# Patient Record
Sex: Male | Born: 1960 | Race: White | Hispanic: No | Marital: Married | Smoking: Never smoker
Health system: Southern US, Community
[De-identification: ages and names within clinical notes are randomized; demographics above are authoritative.]

## PROBLEM LIST (undated history)

## (undated) DIAGNOSIS — D702 Other drug-induced agranulocytosis: Secondary | ICD-10-CM

## (undated) DIAGNOSIS — S39012A Strain of muscle, fascia and tendon of lower back, initial encounter: Secondary | ICD-10-CM

## (undated) DIAGNOSIS — M109 Gout, unspecified: Secondary | ICD-10-CM

## (undated) DIAGNOSIS — T7840XA Allergy, unspecified, initial encounter: Secondary | ICD-10-CM

## (undated) DIAGNOSIS — D696 Thrombocytopenia, unspecified: Secondary | ICD-10-CM

## (undated) DIAGNOSIS — R011 Cardiac murmur, unspecified: Secondary | ICD-10-CM

## (undated) DIAGNOSIS — N390 Urinary tract infection, site not specified: Secondary | ICD-10-CM

## (undated) HISTORY — DX: Thrombocytopenia, unspecified: D69.6

## (undated) HISTORY — DX: Allergy, unspecified, initial encounter: T78.40XA

## (undated) HISTORY — DX: Strain of muscle, fascia and tendon of lower back, initial encounter: S39.012A

## (undated) HISTORY — DX: Gout, unspecified: M10.9

## (undated) HISTORY — DX: Other drug-induced agranulocytosis: D70.2

## (undated) HISTORY — PX: HERNIA REPAIR: SHX51

## (undated) HISTORY — DX: Urinary tract infection, site not specified: N39.0

## (undated) HISTORY — DX: Cardiac murmur, unspecified: R01.1

## (undated) HISTORY — PX: OTHER SURGICAL HISTORY: SHX169

---
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
## 2007-09-06 ENCOUNTER — Ambulatory Visit (HOSPITAL_COMMUNITY): Admission: RE | Admit: 2007-09-06 | Discharge: 2007-09-06 | Payer: Self-pay | Admitting: Surgery

## 2011-03-28 NOTE — Op Note (Signed)
NAMEELIAN, GLOSTER              ACCOUNT NO.:  [id number]   MEDICAL RECORD NO.:  [id number]          PATIENT TYPE:  AMB   LOCATION:  DAY                          FACILITY:  [facility]   PHYSICIAN:  Isabel Caprice. Hassell Done, MD  DATE OF BIRTH:  1961/03/12   DATE OF PROCEDURE:  09/06/2007  DATE OF DISCHARGE:                               OPERATIVE REPORT   INDICATIONS:  Laderrick Wilk is a 50 year old gentleman who works this for  the city of [place] and has been having some squishy discomfort in  left inguinal region.   PREOPERATIVE DIAGNOSIS:  Bilateral inguinal hernias.   POSTOPERATIVE DIAGNOSIS:  Bilateral direct inguinal hernias.   DESCRIPTION OF PROCEDURE:  The patient was taken to room 10 on September 06, 2007 and given general anesthesia.  The abdomen was prepped with  Techni-Care and draped sterilely.  Transverse incision was made just  below the umbilicus and I cut the anterior rectus sheath and gently  retracted the rectus muscle laterally, and then I put my finger in and  dissected down toward the pubis.  I then put in the 123 balloon  dissector kit from the [facility] and insufflated that.  This created a  nice dissection done under 0 degree scope vision.  I then removed the  balloon and blew up the trocar holder and then insufflated the  preperitoneal space.  I dissected the cord structures first and had a  fatty cord on the right but no indirect hernia was seen, but a very  prominent right direct hernia was seen involving the entire floor.  The  sac was noted and the right-side did appear to be large.  Similarly we  dissected the left side and dissected the cord structures and saw no  indirect hernia, had a very large left direct hernia.  These were well  delineated and felt that I would repair these with a piece of 3-D max  mesh.  We then placed the left side first using a large piece of 3-D max  mesh, tacking it along the inguinal ligament, tacking it anteriorly and  also tied  with one tacked laterally where I could feel it.  Similarly on  the right side after we did get a nice lower lateral dissection, I  tacked it medially along the Cooper's ligament anteriorly and then  laterally.  Following this, there was no bleeding noted.  The mesh  seemed be lying very nicely and I then deflated the preperitoneal space.  The fascia was closed with 0 Vicryl and then the wounds were closed 4-0  Vicryl with Dermabond.  The patient tolerated the  procedure well and  was taken to recovery room in satisfactory condition.      Isabel Caprice Hassell Done, MD  Electronically Signed     MBM/MEDQ  D:  09/06/2007  T:  09/08/2007  Job:  [id number]   cc:   Richardson Landry A. Everlene Farrier, M.D.  Fax: [phone]

## 2011-10-18 ENCOUNTER — Ambulatory Visit (INDEPENDENT_AMBULATORY_CARE_PROVIDER_SITE_OTHER): Payer: 59

## 2011-10-18 DIAGNOSIS — D61818 Other pancytopenia: Secondary | ICD-10-CM

## 2011-10-18 DIAGNOSIS — M109 Gout, unspecified: Secondary | ICD-10-CM

## 2012-05-29 ENCOUNTER — Ambulatory Visit (INDEPENDENT_AMBULATORY_CARE_PROVIDER_SITE_OTHER): Payer: 59 | Admitting: Emergency Medicine

## 2012-05-29 VITALS — BP 118/74 | HR 57 | Temp 98.0°F | Resp 18 | Ht 74.0 in | Wt 199.0 lb

## 2012-05-29 DIAGNOSIS — M7660 Achilles tendinitis, unspecified leg: Secondary | ICD-10-CM

## 2012-05-29 MED ORDER — MELOXICAM 15 MG PO TABS: 15.0000 mg | ORAL_TABLET | Freq: Every day | ORAL | Status: DC

## 2012-05-29 NOTE — Progress Notes (Signed)
  Subjective:    Patient ID: Brendan Short, male    DOB: [birth year], 51 y.o.   MRN: [id number]  HPI 51 year old pleasant male presents with right achilles tendon pain since yesterday. He has been participating in a boot camp and 2 days ago had an intense workout.  Did not notice the pain until yesterday after work. Describes as a persistent achy, stiff pain.  Wants to make sure he has not ruptured his tendon. 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  Boot camp is finished until mid-August.  He has not taken any medications yet. History of gout but says this feels different.  No paresthesias, weakness, or numbness.      Review of Systems  All other systems reviewed and are negative.       Objective:   Physical Exam  Constitutional: He is oriented to person, place, and time. He appears well-developed and well-nourished.  HENT:  Head: Normocephalic and atraumatic.  Right Ear: External ear normal.  Left Ear: External ear normal.  Neck: Normal range of motion.  Cardiovascular: Normal rate, regular rhythm and normal heart sounds.   Pulmonary/Chest: Effort normal and breath sounds normal.  Musculoskeletal: Normal range of motion.       Right foot: He exhibits no tenderness and no bony tenderness.       Pain with plantarflexion. 5/5 strength  Negative Thompson test  Neurological: He is alert and oriented to person, place, and time.  Psychiatric: He has a normal mood and affect. His behavior is normal. Judgment and thought content normal.          Assessment & Plan:   1. Achilles tendinitis  Will treat conservatively with Mobic daily for 7-10 days.  Follow up if no improvement.  Recommend ice and rest.  Stressed the importance of stretches and warm up prior to intense exercise meloxicam (MOBIC) 15 MG tablet

## 2012-05-29 NOTE — Patient Instructions (Addendum)
Achilles Tendinitis Tendinitis a swelling and soreness of the tendon. The pain in the tendon (cord-like structure which attaches muscle to bone) is produced by tiny tears and the inflammation present in that tendon. It commonly occurs at the shoulders, heels, and elbows. It is usually caused by overusing the tendon and joint involved. Achilles tendinitis involves the Achilles tendon. This is the large tendon in the back of the leg just above the foot. It attaches the large muscles of the lower leg to the heel bone (called calcaneus).  This diagnosis (learning what is wrong) is made by examination. X-rays will be generally be normal if only tendinitis is present. HOME CARE INSTRUCTIONS   Apply ice to the injury for 15 to 20 minutes, 3 to 4 times per day. Put the ice in a plastic bag and place a towel between the bag of ice and your skin.   Try to avoid use other than gentle range of motion while the tendon is painful. Do not resume use until instructed by your caregiver. Then begin use gradually. Do not increase use to the point of pain. If pain does develop, decrease use and continue the above measures. Gradually increase activities that do not cause discomfort until you gradually achieve normal use.   Only take over-the-counter or prescription medicines for pain, discomfort, or fever as directed by your caregiver.  SEEK MEDICAL CARE IF:   Your pain and swelling increase or pain is uncontrolled with medications.   You develop new, unexplained problems (symptoms) or an increase of the symptoms that brought you to your caregiver.   You develop an inability to move your toes or foot, develop warmth and swelling in your foot, or begin running an unexplained temperature.  MAKE SURE YOU:   Understand these instructions.   Will watch your condition.   Will get help right away if you are not doing well or get worse.  Document Released: 08/09/2005 Document Revised: 10/19/2011 Document Reviewed:  06/17/2008 [facility] Patient Information 2012 [place].

## 2012-06-11 ENCOUNTER — Ambulatory Visit (INDEPENDENT_AMBULATORY_CARE_PROVIDER_SITE_OTHER): Payer: 59 | Admitting: Emergency Medicine

## 2012-06-11 VITALS — BP 125/82 | HR 55 | Temp 97.7°F | Resp 18 | Ht 75.5 in | Wt 196.0 lb

## 2012-06-11 DIAGNOSIS — Z0000 Encounter for general adult medical examination without abnormal findings: Secondary | ICD-10-CM

## 2012-06-11 DIAGNOSIS — R6882 Decreased libido: Secondary | ICD-10-CM | POA: Insufficient documentation

## 2012-06-11 DIAGNOSIS — M109 Gout, unspecified: Secondary | ICD-10-CM | POA: Insufficient documentation

## 2012-06-11 DIAGNOSIS — M779 Enthesopathy, unspecified: Secondary | ICD-10-CM

## 2012-06-11 LAB — LIPID PANEL
HDL: 44 mg/dL (ref >39)
LDL Cholesterol: 101 mg/dL — ABNORMAL HIGH (ref 0–99)
Total CHOL/HDL Ratio: 4.2 Ratio
Triglycerides: 190 mg/dL — ABNORMAL HIGH (ref <150)
VLDL: 38 mg/dL (ref 0–40)

## 2012-06-11 LAB — TESTOSTERONE: Testosterone: 382.28 ng/dL (ref 300–890)

## 2012-06-11 LAB — COMPREHENSIVE METABOLIC PANEL
ALT: 29 U/L (ref 0–53)
AST: 28 U/L (ref 0–37)
Alkaline Phosphatase: 45 U/L (ref 39–117)
BUN: 20 mg/dL (ref 6–23)
Calcium: 9.4 mg/dL (ref 8.4–10.5)
Creat: 0.99 mg/dL (ref 0.50–1.35)
Total Bilirubin: 1 mg/dL (ref 0.3–1.2)

## 2012-06-11 LAB — CBC WITH DIFFERENTIAL/PLATELET
Basophils Absolute: 0 10*3/uL (ref 0.0–0.1)
Basophils Relative: 1 % (ref 0–1)
Eosinophils Absolute: 0 10*3/uL (ref 0.0–0.7)
Eosinophils Relative: 1 % (ref 0–5)
HCT: 42.5 % (ref 39.0–52.0)
MCHC: 35.1 g/dL (ref 30.0–36.0)
MCV: 95.9 fL (ref 78.0–100.0)
Monocytes Absolute: 0.3 10*3/uL (ref 0.1–1.0)
Platelets: 124 10*3/uL — ABNORMAL LOW (ref 150–400)
RDW: 12 % (ref 11.5–15.5)
WBC: 4.1 10*3/uL (ref 4.0–10.5)

## 2012-06-11 LAB — POCT URINALYSIS DIPSTICK
Bilirubin, UA: NEGATIVE
Ketones, UA: NEGATIVE
Leukocytes, UA: NEGATIVE

## 2012-06-11 LAB — POCT UA - MICROSCOPIC ONLY
Bacteria, U Microscopic: NEGATIVE
Mucus, UA: NEGATIVE
WBC, Ur, HPF, POC: NEGATIVE
Yeast, UA: NEGATIVE

## 2012-06-11 MED ORDER — PROBENECID 500 MG PO TABS: 500.0000 mg | ORAL_TABLET | Freq: Two times a day (BID) | ORAL | Status: DC

## 2012-06-11 MED ORDER — COLCHICINE 0.6 MG PO TABS: 0.6000 mg | ORAL_TABLET | Freq: Every day | ORAL | Status: DC

## 2012-06-11 MED ORDER — DICLOFENAC SODIUM 1 % TD GEL: 1.0000 "application " | Freq: Two times a day (BID) | TRANSDERMAL | Status: DC

## 2012-06-11 NOTE — Progress Notes (Deleted)
  Subjective:    Patient ID: Brendan Short, male    DOB: 1960/11/25, 51 y.o.   MRN: [id number]  HPI    Review of Systems     Objective:   Physical Exam        Results for orders placed in visit on 06/11/12  IFOBT (OCCULT BLOOD)      Component Value Range   IFOBT Negative    POCT URINALYSIS DIPSTICK      Component Value Range   Color, UA yellow     Clarity, UA clear     Glucose, UA neg     Bilirubin, UA neg     Ketones, UA neg     Spec Grav, UA 1.010     Blood, UA neg     pH, UA 7.0     Protein, UA neg     Urobilinogen, UA 0.2     Nitrite, UA neg     Leukocytes, UA Negative    POCT UA - MICROSCOPIC ONLY      Component Value Range   WBC, Ur, HPF, POC neg     RBC, urine, microscopic 0-1     Bacteria, U Microscopic neg     Mucus, UA neg     Epithelial cells, urine per micros 0-1     Crystals, Ur, HPF, POC neg     Casts, Ur, LPF, POC neg     Yeast, UA neg     Assessment & Plan:

## 2012-06-11 NOTE — Progress Notes (Signed)
@UMFCLOGO @  Patient ID: Brendan Short MRN: [id number], DOB: 1961/03/23 51 y.o. Date of Encounter: 06/11/2012, 11:16 AM  Primary Physician: Jenny Reichmann, MD  Chief Complaint: Physical (CPE)  HPI: 51 y.o. y/o male with history noted below here for CPE.  Doing well. No issues/complaints.  Review of Systems:  Consitutional: No fever, chills, fatigue, night sweats, lymphadenopathy, or weight changes. Patient has had decreased libido. He does not had EEG but has very little desire to be sexually active. Eyes: No visual changes, eye redness, or discharge. ENT/Mouth: Ears: No otalgia, tinnitus, hearing loss, discharge. Nose: No congestion, rhinorrhea, sinus pain, or epistaxis. Throat: No sore throat, post nasal drip, or teeth pain. Cardiovascular: No CP, palpitations, diaphoresis, DOE, edema, orthopnea, PND. Respiratory: No cough, hemoptysis, SOB, or wheezing. Gastrointestinal: No anorexia, dysphagia, reflux, pain, nausea, vomiting, hematemesis, diarrhea, constipation, BRBPR, or melena. Genitourinary: No dysuria, frequency, urgency, hematuria, incontinence, nocturia, decreased urinary stream, discharge, impotence, or testicular pain/masses. Musculoskeletal: No decreased ROM, myalgias, stiffness, joint swelling, or weakness. He does have pain and discomfort in his right Achilles and is doing his stretches to help with this the Skin: No rash, erythema, lesion changes, pain, warmth, jaundice, or pruritis. Neurological: No headache, dizziness, syncope, seizures, tremors, memory loss, coordination problems, or paresthesias. Psychological: No anxiety, depression, hallucinations, SI/HI. Endocrine: No fatigue, polydipsia, polyphagia, polyuria, or known diabetes. All other systems were reviewed and are otherwise negative.  No past medical history on file.   No past surgical history on file.  Home Meds:  Prior to Admission medications   Medication Sig Start Date End Date Taking? Authorizing Provider    cholecalciferol (VITAMIN D) 1000 UNITS tablet Take 1,000 Units by mouth daily.   Yes Historical Provider, MD  colchicine 0.6 MG tablet Take 1 tablet (0.6 mg total) by mouth daily. 06/11/12  Yes [name], MD  meloxicam (MOBIC) 15 MG tablet Take 1 tablet (15 mg total) by mouth daily. 05/29/12 05/29/13 Yes Gautier, PA-C  probenecid (BENEMID) 500 MG tablet Take 1 tablet (500 mg total) by mouth 2 (two) times daily. 06/11/12  Yes [name], MD  diclofenac sodium (VOLTAREN) 1 % GEL Apply 1 application topically 2 (two) times daily. 06/11/12   [name], MD    Allergies:  Allergies  Allergen Reactions  . Allopurinol     History   Social History  . Marital Status: Married    Spouse Name: N/A    Number of Children: N/A  . Years of Education: N/A   Occupational History  . Not on file.   Social History Main Topics  . Smoking status: Never Smoker   . Smokeless tobacco: Not on file  . Alcohol Use: Not on file  . Drug Use: Not on file  . Sexually Active: Not on file   Other Topics Concern  . Not on file   Social History Narrative  . No narrative on file    No family history on file.  Physical Exam:  Blood pressure 125/82, pulse 55, temperature 97.7 F (36.5 C), resp. rate 18, height 6' 3.5" (1.918 m), weight 196 lb (88.905 kg).  General: Well developed, well nourished, in no acute distress. HEENT: Normocephalic, atraumatic. Conjunctiva pink, sclera non-icteric. Pupils 2 mm constricting to 1 mm, round, regular, and equally reactive to light and accomodation. EOMI. Internal auditory canal clear. TMs with good cone of light and without pathology. Nasal mucosa pink. Nares are without discharge. No sinus tenderness. Oral mucosa pink. Dentition .  Pharynx without exudate.   Neck: Supple. Trachea midline. No thyromegaly. Full ROM. No lymphadenopathy. Lungs: Clear to auscultation bilaterally without wheezes, rales, or rhonchi. Breathing is of normal effort and  unlabored. Cardiovascular: RRR with S1 S2. No murmurs, rubs, or gallops appreciated. Distal pulses 2+ symmetrically. No carotid or abdominal bruits.  Abdomen: Soft, non-tender, non-distended with normoactive bowel sounds. No hepatosplenomegaly or masses. No rebound/guarding. No CVA tenderness. Without hernias.  Rectal: No external hemorrhoids or fissures. Rectal vault without masses.   Genitourinary:   circumcised male. No penile lesions. Testes descended bilaterally, and smooth without tenderness or masses.  Musculoskeletal: Full range of motion and 5/5 strength throughout. Without swelling, atrophy, tenderness, crepitus, or warmth. Extremities without clubbing, cyanosis, or edema. Calves supple. Skin: Warm and moist without erythema, ecchymosis, wounds, or rash. Neuro: A+Ox3. CN II-XII grossly intact. Moves all extremities spontaneously. Full sensation throughout. Normal gait. DTR 2+ throughout upper and lower extremities. Finger to nose intact. Psych:  Responds to questions appropriately with a normal affect.   Studies: CBC, CMET, Lipid, PSA, TSH, Vitamin D. testosterone level.  .      Assessment/Plan:  51 y.o. y/o   male here for CPE Patient is stable at present. I did refill his probenecid colchicine, and voltaren gel.  I did suggest he could consider counseling he wants to hold off at this time. He will continue stretches for his Achilles tendon -  Signed, Nena Jordan, MD 06/11/2012 11:16 AM

## 2013-05-01 ENCOUNTER — Ambulatory Visit (INDEPENDENT_AMBULATORY_CARE_PROVIDER_SITE_OTHER): Payer: 59 | Admitting: Emergency Medicine

## 2013-05-01 ENCOUNTER — Ambulatory Visit: Payer: 59

## 2013-05-01 VITALS — BP 127/82 | HR 74 | Temp 98.2°F | Resp 16 | Ht 75.0 in | Wt 195.0 lb

## 2013-05-01 DIAGNOSIS — M779 Enthesopathy, unspecified: Secondary | ICD-10-CM

## 2013-05-01 DIAGNOSIS — M549 Dorsalgia, unspecified: Secondary | ICD-10-CM

## 2013-05-01 LAB — POCT URINALYSIS DIPSTICK
Bilirubin, UA: NEGATIVE
Blood, UA: NEGATIVE
Glucose, UA: NEGATIVE
Nitrite, UA: NEGATIVE
Spec Grav, UA: 1.025

## 2013-05-01 MED ORDER — CYCLOBENZAPRINE HCL 10 MG PO TABS: ORAL_TABLET | ORAL | Status: DC

## 2013-05-01 MED ORDER — DICLOFENAC SODIUM 1 % TD GEL: 1.0000 "application " | Freq: Two times a day (BID) | TRANSDERMAL | Status: DC

## 2013-05-01 MED ORDER — MELOXICAM 15 MG PO TABS: 15.0000 mg | ORAL_TABLET | Freq: Every day | ORAL | Status: AC

## 2013-05-01 NOTE — Progress Notes (Signed)
  Subjective:    Patient ID: Brendan Short, male    DOB: 24-Mar-1961, 52 y.o.   MRN: [id number]  HPI patient presents with pain in his right flank area. He has pain with twisting to the right or to the left. He has no radicular symptoms. He thinks it all started after playing golf    Review of Systems     Objective:   Physical Exam there is tenderness in the right flank area. He has pain when he twists to the right or the left. His deep tendon reflexes of lower extremities are 2+. His motor strength is 5 out of 5  Results for orders placed in visit on 05/01/13  POCT URINALYSIS DIPSTICK      Result Value Range   Color, UA yellow     Clarity, UA clear     Glucose, UA neg     Bilirubin, UA neg     Ketones, UA neg     Spec Grav, UA 1.025     Blood, UA neg     pH, UA 5.5     Protein, UA neg     Urobilinogen, UA 0.2     Nitrite, UA neg     Leukocytes, UA Negative    [facility] reading (PRIMARY) by  Dr Everlene Farrier   T spine and LS spine films are normal  Meds ordered this encounter  Medications  . meloxicam (MOBIC) 15 MG tablet    Sig: Take 1 tablet (15 mg total) by mouth daily.    Dispense:  30 tablet    Refill:  1  . cyclobenzaprine (FLEXERIL) 10 MG tablet    Sig: Take one tablet at bedtime.    Dispense:  30 tablet    Refill:  0  . diclofenac sodium (VOLTAREN) 1 % GEL    Sig: Apply 1 application topically 2 (two) times daily.    Dispense:  100 g    Refill:  11      Assessment & Plan:  We will treat him with Mobic and Flexeril. If sx's persist, will send him to physical therapy.

## 2013-06-15 ENCOUNTER — Other Ambulatory Visit: Payer: Self-pay | Admitting: Emergency Medicine

## 2013-06-17 NOTE — Telephone Encounter (Signed)
Dr Everlene Farrier, you recently saw pt but not for gout. Do you want to give pt RFs or does he need to RTC?

## 2014-02-10 ENCOUNTER — Encounter: Payer: Self-pay | Admitting: Emergency Medicine

## 2014-02-10 ENCOUNTER — Ambulatory Visit (INDEPENDENT_AMBULATORY_CARE_PROVIDER_SITE_OTHER): Payer: 59 | Admitting: Emergency Medicine

## 2014-02-10 ENCOUNTER — Ambulatory Visit: Payer: 59

## 2014-02-10 VITALS — BP 116/74 | HR 61 | Temp 98.5°F | Resp 16 | Ht 74.0 in | Wt 199.6 lb

## 2014-02-10 DIAGNOSIS — M25559 Pain in unspecified hip: Secondary | ICD-10-CM

## 2014-02-10 DIAGNOSIS — M25552 Pain in left hip: Secondary | ICD-10-CM

## 2014-02-10 DIAGNOSIS — Z0000 Encounter for general adult medical examination without abnormal findings: Secondary | ICD-10-CM

## 2014-02-10 LAB — COMPLETE METABOLIC PANEL WITH GFR
ALT: 28 U/L (ref 0–53)
AST: 26 U/L (ref 0–37)
Albumin: 4.2 g/dL (ref 3.5–5.2)
Alkaline Phosphatase: 53 U/L (ref 39–117)
BUN: 21 mg/dL (ref 6–23)
CALCIUM: 9.4 mg/dL (ref 8.4–10.5)
CHLORIDE: 102 meq/L (ref 96–112)
CO2: 31 meq/L (ref 19–32)
CREATININE: 0.97 mg/dL (ref 0.50–1.35)
GFR, Est Non African American: 89 mL/min
Glucose, Bld: 113 mg/dL — ABNORMAL HIGH (ref 70–99)
Potassium: 4.7 mEq/L (ref 3.5–5.3)
SODIUM: 140 meq/L (ref 135–145)
TOTAL PROTEIN: 6.9 g/dL (ref 6.0–8.3)
Total Bilirubin: 0.6 mg/dL (ref 0.2–1.2)

## 2014-02-10 LAB — TSH: TSH: 4.42 u[IU]/mL (ref 0.350–4.500)

## 2014-02-10 LAB — CBC
HCT: 40.9 % (ref 39.0–52.0)
Hemoglobin: 14.4 g/dL (ref 13.0–17.0)
MCH: 34.1 pg — ABNORMAL HIGH (ref 26.0–34.0)
MCHC: 35.2 g/dL (ref 30.0–36.0)
MCV: 96.9 fL (ref 78.0–100.0)
PLATELETS: 168 10*3/uL (ref 150–400)
RBC: 4.22 MIL/uL (ref 4.22–5.81)
RDW: 12.7 % (ref 11.5–15.5)
WBC: 4.3 10*3/uL (ref 4.0–10.5)

## 2014-02-10 LAB — LIPID PANEL
CHOL/HDL RATIO: 4 ratio
CHOLESTEROL: 151 mg/dL (ref 0–200)
HDL: 38 mg/dL — AB (ref >39)
LDL Cholesterol: 68 mg/dL (ref 0–99)
Triglycerides: 226 mg/dL — ABNORMAL HIGH (ref <150)
VLDL: 45 mg/dL — ABNORMAL HIGH (ref 0–40)

## 2014-02-10 LAB — POCT URINALYSIS DIPSTICK
Bilirubin, UA: NEGATIVE
Blood, UA: NEGATIVE
GLUCOSE UA: NEGATIVE
Ketones, UA: NEGATIVE
LEUKOCYTES UA: NEGATIVE
NITRITE UA: NEGATIVE
PROTEIN UA: NEGATIVE
Spec Grav, UA: 1.025
UROBILINOGEN UA: 0.2
pH, UA: 6

## 2014-02-10 LAB — PSA: PSA: 3.17 ng/mL (ref <=4.00)

## 2014-02-10 LAB — IFOBT (OCCULT BLOOD): IMMUNOLOGICAL FECAL OCCULT BLOOD TEST: NEGATIVE

## 2014-02-10 MED ORDER — MONTELUKAST SODIUM 10 MG PO TABS: 10.0000 mg | ORAL_TABLET | Freq: Every day | ORAL | Status: DC

## 2014-02-10 MED ORDER — AZELASTINE HCL 0.1 % NA SOLN: 2.0000 | Freq: Two times a day (BID) | NASAL | Status: DC

## 2014-02-10 NOTE — Addendum Note (Signed)
Addended by: Yvette Rack on: 02/10/2014 05:29 PM   Modules accepted: Orders

## 2014-02-10 NOTE — Progress Notes (Deleted)
   Subjective:    Patient ID: Brendan Short, male    DOB: 10/17/1961, 53 y.o.   MRN: [id number]  HPI    Review of Systems  Constitutional: Negative.   HENT: Positive for rhinorrhea, sinus pressure, sneezing and sore throat.   Eyes: Positive for redness.  Respiratory: Positive for cough.   Cardiovascular: Negative.   Gastrointestinal: Negative.   Endocrine: Negative.   Genitourinary: Negative.   Musculoskeletal: Negative.   Skin: Negative.   Allergic/Immunologic: Positive for environmental allergies.  Neurological: Negative.   Hematological: Negative.   Psychiatric/Behavioral: Negative.        Objective:   Physical Exam        Assessment & Plan:

## 2014-02-10 NOTE — Progress Notes (Signed)
  This chart was scribed for Brendan Russian, MD by Eston Mould, ED Scribe. This patient was seen in room Room/bed 21 and the patient's care was started at 4:32 PM. Subjective:    Patient ID: Brendan Short, male    DOB: Nov 02, 1961, 53 y.o.   MRN: [id number] Chief Complaint  Patient presents with  . Annual Exam  . Sinusitis    acting up for a month and now snoring from the sinus trouble   HPI Brendan Short is a 53 y.o. male who presents to the [facility] for annual exam and sinusitis. Pt states his Gout has been fine. He states he is taking a colcres and prevacid in the morning. He denies taking prevacid at night.  Pt states he has been having post nasal drip causing sore throat, scratchy voice, congestion, and loud snoring. Pt denies having his sleep affected by his current sx. He reports taking some Antihistamines and nasal sprays and states he had some relief but shortly returned. Pt states as a child, he would have frequent shot given for his allergies.   Pt also c/o of intermittent L hip pain. He states he is unsure if the pain is related to his golfing technique or strenuous lifting while at work.  Review of Systems  HENT: Positive for congestion, postnasal drip, sinus pressure, sore throat and voice change.   Allergic/Immunologic: Positive for environmental allergies.  Psychiatric/Behavioral: Negative for sleep disturbance.  All other systems reviewed and are negative.   Objective:   Physical Exam CONSTITUTIONAL: Well developed/well nourished HEAD: Normocephalic/atraumatic EYES: EOMI/PERRL ENMT: Mucous membranes moist NECK: supple no meningeal signs SPINE:entire spine nontender CV: S1/S2 noted, no murmurs/rubs/gallops noted LUNGS: Lungs are clear to auscultation bilaterally, no apparent distress ABDOMEN: soft, nontender, no rebound or guarding GU:no cva tenderness NEURO: Pt is awake/alert, moves all extremitiesx4 EXTREMITIES: pulses normal, full ROM SKIN: warm, color  normal PSYCH: no abnormalities of mood noted  [facility] preliminary x-ray report read by Dr. [name]: Hip x-rays are normal.  Triage Vitals:BP 116/74  Pulse 61  Temp(Src) 98.5 F (36.9 C) (Oral)  Resp 16  Ht 6' 2"  (1.88 m)  Wt 199 lb 9.6 oz (90.538 kg)  BMI 25.62 kg/m2  SpO2 96% Assessment & Plan:   Plan Singulair at night along with as a last and spray. If his snoring and nasal symptoms continue would advise referral to ENT. Hip film showed no definite abnormalities he is referred to the orthopedist for this .  I personally performed the services described in this documentation, which was scribed in my presence. The recorded information has been reviewed and is accurate.

## 2014-02-11 ENCOUNTER — Other Ambulatory Visit: Payer: Self-pay | Admitting: *Deleted

## 2014-02-11 DIAGNOSIS — R972 Elevated prostate specific antigen [PSA]: Secondary | ICD-10-CM

## 2014-02-11 LAB — URIC ACID: Uric Acid, Serum: 7.6 mg/dL (ref 4.0–7.8)

## 2014-02-16 ENCOUNTER — Telehealth: Payer: Self-pay

## 2014-02-16 NOTE — Telephone Encounter (Addendum)
PT NEED TO COME BY AND PICK UP HIS XRAY OF THE HIP PLEASE CALL [phone] AND HIS APPT IS THIS WEEK

## 2014-02-18 NOTE — Telephone Encounter (Signed)
Informed pt his x-ray disc is ready to be picked up.

## 2014-03-20 DIAGNOSIS — R972 Elevated prostate specific antigen [PSA]: Secondary | ICD-10-CM

## 2014-04-09 DIAGNOSIS — R972 Elevated prostate specific antigen [PSA]: Secondary | ICD-10-CM

## 2014-06-10 ENCOUNTER — Other Ambulatory Visit: Payer: Self-pay | Admitting: Emergency Medicine

## 2014-06-12 ENCOUNTER — Other Ambulatory Visit: Payer: Self-pay | Admitting: Emergency Medicine

## 2014-06-29 ENCOUNTER — Ambulatory Visit (INDEPENDENT_AMBULATORY_CARE_PROVIDER_SITE_OTHER): Payer: 59 | Admitting: Family Medicine

## 2014-06-29 VITALS — BP 118/68 | HR 81 | Temp 98.3°F | Resp 17 | Ht 74.5 in | Wt 200.0 lb

## 2014-06-29 DIAGNOSIS — M10021 Idiopathic gout, right elbow: Secondary | ICD-10-CM

## 2014-06-29 DIAGNOSIS — M109 Gout, unspecified: Secondary | ICD-10-CM

## 2014-06-29 MED ORDER — PREDNISONE 10 MG PO TABS: ORAL_TABLET | ORAL | Status: DC

## 2014-06-29 MED ORDER — HYDROCODONE-ACETAMINOPHEN 5-325 MG PO TABS: 1.0000 | ORAL_TABLET | Freq: Four times a day (QID) | ORAL | Status: DC | PRN

## 2014-06-29 NOTE — Progress Notes (Signed)
   Subjective:    Patient ID: Brendan Short, male    DOB: October 15, 1961, 53 y.o.   MRN: [id number]  HPI Patient reports that his right elbow has felt like it is on fire for 3 days. Played golf 3 days ago and then hit balls yesterday. He has a history of gout and is on preventive medication. Has had gout in wrists, elbow and ankle previously. Had a couple of beers yesterday and ate shrimp last night. Tried voltaren gel and naprosyn 2 tablets yesterday without relief.   Had an xray of the left elbow years ago and was noted to have spurs. Played a lot of tennis growing up.    Review of Systems No fever, no other joint involvement.    Objective:   Physical Exam  Vitals reviewed. Constitutional: He is oriented to person, place, and time. He appears well-developed and well-nourished.  HENT:  Head: Normocephalic and atraumatic.  Eyes: Conjunctivae are normal.  Neck: Normal range of motion. Neck supple.  Cardiovascular: Normal rate.   Pulmonary/Chest: Effort normal.  Musculoskeletal: Normal range of motion. He exhibits edema (Right elbow with moderate swelling, slightly red, no warmth.) and tenderness (Tender over right medial epycondyle.).  Neurological: He is alert and oriented to person, place, and time.  Skin: Skin is warm and dry.  Psychiatric: He has a normal mood and affect. His behavior is normal. Judgment and thought content normal.      Assessment & Plan:  Discussed with Dr. Lorelei Pont  1. Acute idiopathic gout of right elbow - predniSONE (DELTASONE) 10 MG tablet; Take 3 tablets daily with a meal  Dispense: 9 tablet; Refill: 0 - HYDROcodone-acetaminophen (NORCO) 5-325 MG per tablet; Take 1 tablet by mouth every 6 (six) hours as needed.  Dispense: 20 tablet; Refill: 0 -follow up if no improvement in 1 week. -provided written information regarding gout. Patient with long history of gout and knows his triggers.  Elby Beck, FNP-BC  [facility]  06/29/2014 9:05 AM

## 2014-06-29 NOTE — Patient Instructions (Signed)

## 2014-08-06 ENCOUNTER — Other Ambulatory Visit: Payer: Self-pay | Admitting: Emergency Medicine

## 2014-08-10 ENCOUNTER — Ambulatory Visit (INDEPENDENT_AMBULATORY_CARE_PROVIDER_SITE_OTHER): Payer: 59 | Admitting: Emergency Medicine

## 2014-08-10 VITALS — BP 114/68 | HR 73 | Temp 98.0°F | Resp 17 | Ht 75.0 in | Wt 203.0 lb

## 2014-08-10 DIAGNOSIS — Z8739 Personal history of other diseases of the musculoskeletal system and connective tissue: Secondary | ICD-10-CM

## 2014-08-10 DIAGNOSIS — Z862 Personal history of diseases of the blood and blood-forming organs and certain disorders involving the immune mechanism: Secondary | ICD-10-CM

## 2014-08-10 DIAGNOSIS — Z79899 Other long term (current) drug therapy: Secondary | ICD-10-CM

## 2014-08-10 DIAGNOSIS — M779 Enthesopathy, unspecified: Secondary | ICD-10-CM

## 2014-08-10 DIAGNOSIS — M545 Low back pain, unspecified: Secondary | ICD-10-CM

## 2014-08-10 DIAGNOSIS — Z8639 Personal history of other endocrine, nutritional and metabolic disease: Secondary | ICD-10-CM

## 2014-08-10 LAB — COMPREHENSIVE METABOLIC PANEL
ALK PHOS: 43 U/L (ref 39–117)
ALT: 30 U/L (ref 0–53)
AST: 26 U/L (ref 0–37)
Albumin: 4.5 g/dL (ref 3.5–5.2)
BILIRUBIN TOTAL: 0.8 mg/dL (ref 0.2–1.2)
BUN: 21 mg/dL (ref 6–23)
CO2: 28 mEq/L (ref 19–32)
Calcium: 9.6 mg/dL (ref 8.4–10.5)
Chloride: 101 mEq/L (ref 96–112)
Creat: 1.17 mg/dL (ref 0.50–1.35)
Glucose, Bld: 104 mg/dL — ABNORMAL HIGH (ref 70–99)
Potassium: 4.4 mEq/L (ref 3.5–5.3)
SODIUM: 140 meq/L (ref 135–145)
TOTAL PROTEIN: 7.2 g/dL (ref 6.0–8.3)

## 2014-08-10 LAB — URIC ACID: Uric Acid, Serum: 6.7 mg/dL (ref 4.0–7.8)

## 2014-08-10 LAB — POCT CBC
Granulocyte percent: 65.6 %G (ref 37–80)
HCT, POC: 44.6 % (ref 43.5–53.7)
HEMOGLOBIN: 15 g/dL (ref 14.1–18.1)
Lymph, poc: 1.5 (ref 0.6–3.4)
MCH, POC: 33.8 pg — AB (ref 27–31.2)
MCHC: 33.6 g/dL (ref 31.8–35.4)
MCV: 100.5 fL — AB (ref 80–97)
MID (cbc): 0.5 (ref 0–0.9)
MPV: 8.3 fL (ref 0–99.8)
PLATELET COUNT, POC: 124 10*3/uL — AB (ref 142–424)
POC Granulocyte: 3.8 (ref 2–6.9)
POC LYMPH PERCENT: 26.2 %L (ref 10–50)
POC MID %: 8.2 % (ref 0–12)
RBC: 4.44 M/uL — AB (ref 4.69–6.13)
RDW, POC: 12.6 %
WBC: 5.8 10*3/uL (ref 4.6–10.2)

## 2014-08-10 MED ORDER — DICLOFENAC SODIUM 1 % TD GEL: 1.0000 "application " | Freq: Two times a day (BID) | TRANSDERMAL | Status: DC

## 2014-08-10 MED ORDER — PROBENECID 500 MG PO TABS: 500.0000 mg | ORAL_TABLET | Freq: Every day | ORAL | Status: DC

## 2014-08-10 MED ORDER — COLCHICINE 0.6 MG PO TABS: ORAL_TABLET | ORAL | Status: DC

## 2014-08-10 NOTE — Progress Notes (Signed)
   Subjective:    Patient ID: Brendan Short, male    DOB: Nov 04, 1961, 53 y.o.   MRN: [id number] This chart was scribed for Brendan Queen, MD by Zola Button, ED Scribe. This patient was seen in room Room/bed 8 info and the patient's care was started at 10:03 AM.   HPI HPI Comments: Brendan Short is a 53 y.o. male who presents to the Emergency Department for a medication refill. Patient is doing fine, but he had a small gout flare about a month ago for which he was prescribed prednisone, which helped with the symptoms. Patient says he is due for a physical in about 6 months and a follow-up with Dr. [name] next week. He currently takes Colcrys, Voltaren and Benemid and requests prescription refill for these medications.    Review of Systems See HPI. A complete 10 system review of systems was obtained and all systems are negative except as noted in the HPI and PMH.      Objective:   Physical Exam  CONSTITUTIONAL: Well developed/well nourished HEAD: Normocephalic/atraumatic EYES: EOM/PERRL ENMT: Mucous membranes moist NECK: supple no meningeal signs SPINE: entire spine nontender CV: S1/S2 noted, no murmurs/rubs/gallops noted LUNGS: Lungs are clear to auscultation bilaterally, no apparent distress ABDOMEN: soft, nontender, no rebound or guarding GU: no cva tenderness NEURO: Pt is awake/alert, moves all extremitiesx4 EXTREMITIES: pulses normal, full ROM SKIN: warm, color normal PSYCH: no abnormalities of mood noted Results for orders placed in visit on 08/10/14  POCT CBC      Result Value Ref Range   WBC 5.8  4.6 - 10.2 K/uL   Lymph, poc 1.5  0.6 - 3.4   POC LYMPH PERCENT 26.2  10 - 50 %L   MID (cbc) 0.5  0 - 0.9   POC MID % 8.2  0 - 12 %M   POC Granulocyte 3.8  2 - 6.9   Granulocyte percent 65.6  37 - 80 %G   RBC 4.44 (*) 4.69 - 6.13 M/uL   Hemoglobin 15.0  14.1 - 18.1 g/dL   HCT, POC 44.6  43.5 - 53.7 %   MCV 100.5 (*) 80 - 97 fL   MCH, POC 33.8 (*) 27 - 31.2 pg   MCHC 33.6   31.8 - 35.4 g/dL   RDW, POC 12.6     Platelet Count, POC 124 (*) 142 - 424 K/uL   MPV 8.3  0 - 99.8 fL        Assessment & Plan:   Patient doing well regarding his gout. Meds were refilled. He takes 1 Benemid a day and 1 Colcrys a day. I also refilled his Voltaren gel. He has follow-up with Dr. [name] next week.I personally performed the services described in this documentation, which was scribed in my presence. The recorded information has been reviewed and is accurate.

## 2014-08-12 ENCOUNTER — Encounter: Payer: Self-pay | Admitting: *Deleted

## 2014-12-16 ENCOUNTER — Ambulatory Visit (INDEPENDENT_AMBULATORY_CARE_PROVIDER_SITE_OTHER): Payer: 59 | Admitting: Emergency Medicine

## 2014-12-16 ENCOUNTER — Ambulatory Visit (INDEPENDENT_AMBULATORY_CARE_PROVIDER_SITE_OTHER): Payer: 59

## 2014-12-16 VITALS — BP 114/76 | HR 70 | Temp 98.3°F | Resp 18 | Ht 75.0 in | Wt 206.8 lb

## 2014-12-16 DIAGNOSIS — R1030 Lower abdominal pain, unspecified: Secondary | ICD-10-CM

## 2014-12-16 DIAGNOSIS — R1032 Left lower quadrant pain: Secondary | ICD-10-CM

## 2014-12-16 DIAGNOSIS — M25552 Pain in left hip: Secondary | ICD-10-CM

## 2014-12-16 LAB — POCT URINALYSIS DIPSTICK
Bilirubin, UA: NEGATIVE
Blood, UA: NEGATIVE
Glucose, UA: NEGATIVE
Ketones, UA: NEGATIVE
Leukocytes, UA: NEGATIVE
Nitrite, UA: NEGATIVE
Protein, UA: NEGATIVE
Spec Grav, UA: 1.01
Urobilinogen, UA: 0.2
pH, UA: 5.5

## 2014-12-16 MED ORDER — MELOXICAM 15 MG PO TABS: 15.0000 mg | ORAL_TABLET | Freq: Every day | ORAL | Status: DC

## 2014-12-16 NOTE — Progress Notes (Addendum)
   Subjective:    Patient ID: Brendan Short, male    DOB: [birth year], 54 y.o.   MRN: [id number] This chart was scribed for [name], MD by Zola Button, Medical Scribe. This patient was seen in room 10 and the patient's care was started at 8:21 AM.   HPI HPI Comments: Brendan Short is a 54 y.o. male with a hx of gout who presents to the [facility] complaining of gradual onset, intermittent left groin pain that started about 2 months ago but worsened in the past 2 weeks. The pain is described as a "grabbing pain;" he initially thought it was a pulled muscle. Patient does not remember a specific motion or any specific time when the pain started, and he has not seen a bulge. The pain is worsened with certain movements. Patient denies testicular pain or any problems with his testicles. He sees his urologist every 6 months. Patient has PSHx of inguinal hernia surgery on both sides in 2008.    Review of Systems  Genitourinary: Negative for testicular pain.       Objective:   Physical Exam CONSTITUTIONAL: Well developed/well nourished HEAD: Normocephalic/atraumatic EYES: EOM/PERRL ENMT: Mucous membranes moist NECK: supple no meningeal signs SPINE: entire spine nontender CV: S1/S2 noted, no murmurs/rubs/gallops noted LUNGS: Lungs are clear to auscultation bilaterally, no apparent distress ABDOMEN: soft, nontender, no rebound or guarding GU: Testicles are normal. No palpable inguinal hernia. NEURO: Pt is awake/alert, moves all extremitiesx4 EXTREMITIES: There is decreased ROM of the left hip. SKIN: warm, color normal PSYCH: no abnormalities of moo [facility] reading (PRIMARY) by  Dr.Taralee Marcus moderate arthritis  R/O AVN     Assessment & Plan:  Will treat patient with Mobic. He knows the exercises to do with his left hip. If the radiologist sees signs of avascular necrosis, will proceed with MRI, otherwise, I will see the patient in March.I personally performed the services  described in this documentation, which was scribed in my presence. The recorded information has been reviewed and is accurate.

## 2015-01-26 ENCOUNTER — Ambulatory Visit (INDEPENDENT_AMBULATORY_CARE_PROVIDER_SITE_OTHER): Payer: 59 | Admitting: Emergency Medicine

## 2015-01-26 ENCOUNTER — Ambulatory Visit (INDEPENDENT_AMBULATORY_CARE_PROVIDER_SITE_OTHER): Payer: 59

## 2015-01-26 ENCOUNTER — Encounter: Payer: Self-pay | Admitting: Emergency Medicine

## 2015-01-26 VITALS — BP 124/85 | HR 60 | Temp 97.5°F | Resp 16 | Ht 75.0 in | Wt 205.0 lb

## 2015-01-26 DIAGNOSIS — Z8249 Family history of ischemic heart disease and other diseases of the circulatory system: Secondary | ICD-10-CM

## 2015-01-26 DIAGNOSIS — Z13220 Encounter for screening for lipoid disorders: Secondary | ICD-10-CM | POA: Diagnosis not present

## 2015-01-26 DIAGNOSIS — Z1329 Encounter for screening for other suspected endocrine disorder: Secondary | ICD-10-CM | POA: Diagnosis not present

## 2015-01-26 DIAGNOSIS — R739 Hyperglycemia, unspecified: Secondary | ICD-10-CM

## 2015-01-26 DIAGNOSIS — Z0000 Encounter for general adult medical examination without abnormal findings: Secondary | ICD-10-CM | POA: Diagnosis not present

## 2015-01-26 DIAGNOSIS — M10022 Idiopathic gout, left elbow: Secondary | ICD-10-CM

## 2015-01-26 DIAGNOSIS — M109 Gout, unspecified: Secondary | ICD-10-CM

## 2015-01-26 LAB — CBC WITH DIFFERENTIAL/PLATELET
BASOS ABS: 0 10*3/uL (ref 0.0–0.1)
Basophils Relative: 0 % (ref 0–1)
Eosinophils Absolute: 0 10*3/uL (ref 0.0–0.7)
Eosinophils Relative: 1 % (ref 0–5)
HCT: 42.3 % (ref 39.0–52.0)
Hemoglobin: 14.4 g/dL (ref 13.0–17.0)
Lymphocytes Relative: 35 % (ref 12–46)
Lymphs Abs: 1.3 10*3/uL (ref 0.7–4.0)
MCH: 33.2 pg (ref 26.0–34.0)
MCHC: 34 g/dL (ref 30.0–36.0)
MCV: 97.5 fL (ref 78.0–100.0)
MPV: 10.9 fL (ref 8.6–12.4)
Monocytes Absolute: 0.4 10*3/uL (ref 0.1–1.0)
Monocytes Relative: 11 % (ref 3–12)
NEUTROS ABS: 2 10*3/uL (ref 1.7–7.7)
Neutrophils Relative %: 53 % (ref 43–77)
Platelets: 142 10*3/uL — ABNORMAL LOW (ref 150–400)
RBC: 4.34 MIL/uL (ref 4.22–5.81)
RDW: 12.7 % (ref 11.5–15.5)
WBC: 3.7 10*3/uL — ABNORMAL LOW (ref 4.0–10.5)

## 2015-01-26 LAB — COMPLETE METABOLIC PANEL WITH GFR
ALK PHOS: 54 U/L (ref 39–117)
ALT: 31 U/L (ref 0–53)
AST: 28 U/L (ref 0–37)
Albumin: 4.2 g/dL (ref 3.5–5.2)
BUN: 20 mg/dL (ref 6–23)
CALCIUM: 9.1 mg/dL (ref 8.4–10.5)
CHLORIDE: 99 meq/L (ref 96–112)
CO2: 28 mEq/L (ref 19–32)
Creat: 1 mg/dL (ref 0.50–1.35)
GFR, Est African American: >89 mL/min
GFR, Est Non African American: 85 mL/min
Glucose, Bld: 99 mg/dL (ref 70–99)
POTASSIUM: 3.9 meq/L (ref 3.5–5.3)
Sodium: 135 mEq/L (ref 135–145)
TOTAL PROTEIN: 7.3 g/dL (ref 6.0–8.3)
Total Bilirubin: 0.9 mg/dL (ref 0.2–1.2)

## 2015-01-26 LAB — TSH: TSH: 2.859 u[IU]/mL (ref 0.350–4.500)

## 2015-01-26 LAB — POCT URINALYSIS DIPSTICK
Bilirubin, UA: NEGATIVE
GLUCOSE UA: NEGATIVE
Ketones, UA: NEGATIVE
Leukocytes, UA: NEGATIVE
Nitrite, UA: NEGATIVE
Protein, UA: NEGATIVE
RBC UA: NEGATIVE
UROBILINOGEN UA: 0.2
pH, UA: 5.5

## 2015-01-26 LAB — URIC ACID: Uric Acid, Serum: 7.3 mg/dL (ref 4.0–7.8)

## 2015-01-26 LAB — LIPID PANEL
Cholesterol: 160 mg/dL (ref 0–200)
HDL: 31 mg/dL — ABNORMAL LOW (ref >=40)
TRIGLYCERIDES: 459 mg/dL — AB (ref <150)
Total CHOL/HDL Ratio: 5.2 Ratio

## 2015-01-26 LAB — HEMOGLOBIN A1C
Hgb A1c MFr Bld: 5.4 % (ref <5.7)
Mean Plasma Glucose: 108 mg/dL (ref <117)

## 2015-01-26 MED ORDER — MONTELUKAST SODIUM 10 MG PO TABS: 10.0000 mg | ORAL_TABLET | Freq: Every day | ORAL | Status: DC

## 2015-01-26 MED ORDER — COLCHICINE 0.6 MG PO TABS: ORAL_TABLET | ORAL | Status: DC

## 2015-01-26 MED ORDER — PROBENECID 500 MG PO TABS: 500.0000 mg | ORAL_TABLET | Freq: Every day | ORAL | Status: DC

## 2015-01-26 NOTE — Progress Notes (Addendum)
Subjective:  This chart was scribed for Brendan Russian, MD by Ladene Artist, ED Scribe. The patient was seen in room 22. Patient's care was started at 8:39 AM.   Patient ID: Brendan Short, male    DOB: 1961/03/06, 54 y.o.   MRN: [id number]  Chief Complaint  Patient presents with  . Annual Exam   HPI HPI Comments: Brendan Short is a 54 y.o. male, with a h/o gout, who presents to the [facility] for an annual exam.   Gout Pt states that gout has improved overall. He reports occasional mild flare-ups that are improved with stretching and exercising.   Pollen Allergy  Pt reports flare-up with environmental allergies last week; triggered by pollen. As a child, pt received allergy shots for pollen. He states that allergies improved but have worsened over the past few years. Pt has been treating nasal congestion, scratchy throat, itchy and watery eyes with Singulair and nasal spray.   Preventative Maintenance  Pt has an upcoming annual rectal exam by urologist in September or October.  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 Pt's mother died at age 34 from aortic aneurysm. Pt has not been seen by a cardiologist yet.   This summer, pt plans to travel to the [place] and play golf more often.   Past Medical History  Diagnosis Date  . Allergy    Current Outpatient Prescriptions on File Prior to Visit  Medication Sig Dispense Refill  . cholecalciferol (VITAMIN D) 1000 UNITS tablet Take 1,000 Units by mouth daily.    . colchicine (COLCRYS) 0.6 MG tablet TAKE 1 TABLET BY MOUTH EVERY DAY 30 tablet 11  . diclofenac sodium (VOLTAREN) 1 % GEL Apply 1 application topically 2 (two) times daily. 100 g 11  . probenecid (BENEMID) 500 MG tablet Take 1 tablet (500 mg total) by mouth daily. 30 tablet 11   No current facility-administered medications on file prior to visit.   Allergies  Allergen Reactions   . Allopurinol    Review of Systems  HENT: Positive for congestion.   Eyes: Positive for itching.  Allergic/Immunologic: Positive for environmental allergies.      Objective:   Physical Exam CONSTITUTIONAL: Well developed/well nourished HEAD: Normocephalic/atraumatic, 81m papilloma on mid L upper lid with benign appearance  EYES: EOMI/PERRL ENMT: Mucous membranes moist NECK: supple no meningeal signs SPINE/BACK:entire spine nontender CV: S1/S2 noted, no murmurs/rubs/gallops noted LUNGS: Lungs are clear to auscultation bilaterally, no apparent distress ABDOMEN: soft, nontender, no rebound or guarding, bowel sounds noted throughout abdomen GU:no cva tenderness. Prostate exam deferred. Pt has examination by urologist yearly  NEURO: Pt is awake/alert/appropriate, moves all extremitiesx4.  No facial droop.   EXTREMITIES: pulses normal/equal, full ROM, degenerative changes in fingers on both hands SKIN: warm, color normal PSYCH: no abnormalities of mood noted, alert and oriented to situation   EKG normal sinus rhythm sinus bradycardia [facility] reading (PRIMARY) by  Dr. DEverlene Farrier NAD Results for orders placed or performed in visit on 01/26/15  POCT urinalysis dipstick  Result Value Ref Range   Color, UA yellow    Clarity, UA clear    Glucose, UA neg    Bilirubin, UA neg    Ketones, UA neg    Spec Grav, UA <=1.005    Blood, UA neg    pH, UA 5.5    Protein, UA neg  Urobilinogen, UA 0.2    Nitrite, UA neg    Leukocytes, UA Negative    Assessment & Plan:  Patient stable. Singulair refilled.Recheck 1 year.I personally performed the services described in this documentation, which was scribed in my presence. The recorded information has been reviewed and is accurate.

## 2015-08-17 ENCOUNTER — Encounter: Payer: Self-pay | Admitting: Emergency Medicine

## 2015-09-29 ENCOUNTER — Ambulatory Visit (INDEPENDENT_AMBULATORY_CARE_PROVIDER_SITE_OTHER): Payer: Commercial Managed Care - HMO

## 2015-09-29 ENCOUNTER — Ambulatory Visit (INDEPENDENT_AMBULATORY_CARE_PROVIDER_SITE_OTHER): Payer: Commercial Managed Care - HMO | Admitting: Emergency Medicine

## 2015-09-29 VITALS — BP 124/78 | HR 67 | Temp 98.6°F | Resp 17 | Ht 75.5 in | Wt 210.0 lb

## 2015-09-29 DIAGNOSIS — M545 Low back pain, unspecified: Secondary | ICD-10-CM

## 2015-09-29 NOTE — Patient Instructions (Signed)

## 2015-09-29 NOTE — Progress Notes (Signed)
This chart was scribed for [name], MD by Moises Blood, Medical Scribe. This patient was seen in Room 2 and the patient's care was started 10:15 AM.  Chief Complaint:  Chief Complaint  Patient presents with  . Back Pain    lower back pain     HPI: Brendan Short is a 54 y.o. male who reports to [facility] today complaining of lower back pain.  He has been working harder for month of October. He was driving and hit a few bumps in the road, he felt some twinge in his back 2 weeks ago. He took some aleve without relief. He went to medical services and was given a shot and meloxicam. He went to bed that night and couldn't get out of bed in the morning. He applied a cold pad and let the medication settle in. He went back to work and was checked, everything was fine last week. He felt good until this morning, when he was twisting while clearing weeds.   He had similar problems in the past, due to hernia.   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   Past Medical History  Diagnosis Date  . Allergy    Past Surgical History  Procedure Laterality Date  . Hernia repair    . Broken nose  1970   Social History   Social History  . Marital Status: Married    Spouse Name: N/A  . Number of Children: N/A  . Years of Education: N/A   Social History Main Topics  . Smoking status: Never Smoker   . Smokeless tobacco: None  . Alcohol Use: 0.0 oz/week    0 Standard drinks or equivalent per week  . Drug Use: No  . Sexual Activity: Yes   Other Topics Concern  . None   Social History Narrative   Family History  Problem Relation Age of Onset  . Heart disease Mother   . Cancer Father   . Heart disease Maternal Grandmother    Allergies  Allergen Reactions  . Allopurinol    Prior to Admission medications   Medication Sig Start Date End Date Taking? Authorizing Provider  cholecalciferol (VITAMIN D) 1000 UNITS tablet Take 1,000 Units by mouth daily.    Historical Provider,  MD  colchicine (COLCRYS) 0.6 MG tablet TAKE 1 TABLET BY MOUTH EVERY DAY 01/26/15   [name], MD  diclofenac sodium (VOLTAREN) 1 % GEL Apply 1 application topically 2 (two) times daily. 08/10/14   [name], MD  montelukast (SINGULAIR) 10 MG tablet Take 1 tablet (10 mg total) by mouth at bedtime. 01/26/15   [name], MD  probenecid (BENEMID) 500 MG tablet Take 1 tablet (500 mg total) by mouth daily. 01/26/15   [name], MD     ROS:  Constitutional: negative for chills, fever, night sweats, weight changes, or fatigue  HEENT: negative for vision changes, hearing loss, congestion, rhinorrhea, ST, epistaxis, or sinus pressure Cardiovascular: negative for chest pain or palpitations Respiratory: negative for hemoptysis, wheezing, shortness of breath, or cough Abdominal: negative for abdominal pain, nausea, vomiting, diarrhea, or constipation Dermatological: negative for rash Neurologic: negative for headache, dizziness, or syncope Musc: positive for back pain (lower)  All other systems reviewed and are otherwise negative with the exception to those above and in the HPI.  PHYSICAL EXAM: Filed Vitals:   09/29/15 1006  BP: 124/78  Pulse: 67  Temp: 98.6 F (37 C)  Resp: 17  Body mass index is 25.89 kg/(m^2).   General: Alert, no acute distress HEENT:  Normocephalic, atraumatic, oropharynx patent. Eye: [name] [facility] Cardiovascular:  Regular rate and rhythm, no rubs murmurs or gallops.  No Carotid bruits, radial pulse intact. No pedal edema.  Respiratory: Clear to auscultation bilaterally.  No wheezes, rales, or rhonchi.  No cyanosis, no use of accessory musculature Abdominal: No organomegaly, abdomen is soft and non-tender, positive bowel sounds. No masses. Musculoskeletal: Gait intact. No edema; Mild tenderness over lumbar spine, negative straight leg raising  Skin: No rashes. Neurologic: Facial musculature symmetric; reflexes 2+ motor strength equal 5/5 Psychiatric:  Patient acts appropriately throughout our interaction.  Lymphatic: No cervical or submandibular lymphadenopathy Genitourinary/Anorectal: No acute findings  LABS:    EKG/XRAY:   Primary read interpreted by [facility]. [facility]lene Farrier at [facility]: xray back: no acute findings, mild degenerative arthritis   ASSESSMENT/PLAN: He will continue meloxicam and Flexeril at present time. He will be cautious about his lifting. He is currently under [employer] to the city. He will let me know if his back a is persistent and whether I need to make referrals to orthopedics or for further evaluation by MRI.  By signing my name below, I, Moises Blood, attest that this documentation has been prepared under the direction and in the presence of [name], MD. Electronically Signed: Moises Blood, [place]. 09/29/2015 , 10:15 AM . I personally performed the services described in this documentation, which was scribed in my presence. The recorded information has been reviewed and is accurate.   Gross sideeffects, risk and benefits, and alternatives of medications d/w patient. Patient is aware that all medications have potential sideeffects and we are unable to predict every sideeffect or drug-drug interaction that may occur.  [name] MD 09/29/2015 10:15 AM

## 2016-02-03 ENCOUNTER — Ambulatory Visit (INDEPENDENT_AMBULATORY_CARE_PROVIDER_SITE_OTHER): Payer: Commercial Managed Care - HMO | Admitting: Emergency Medicine

## 2016-02-03 ENCOUNTER — Encounter: Payer: Self-pay | Admitting: Emergency Medicine

## 2016-02-03 VITALS — BP 114/76 | HR 73 | Temp 97.9°F | Resp 16 | Ht 74.5 in | Wt 207.6 lb

## 2016-02-03 DIAGNOSIS — Z8639 Personal history of other endocrine, nutritional and metabolic disease: Secondary | ICD-10-CM | POA: Diagnosis not present

## 2016-02-03 DIAGNOSIS — M545 Low back pain, unspecified: Secondary | ICD-10-CM

## 2016-02-03 DIAGNOSIS — R739 Hyperglycemia, unspecified: Secondary | ICD-10-CM | POA: Diagnosis not present

## 2016-02-03 DIAGNOSIS — Z1329 Encounter for screening for other suspected endocrine disorder: Secondary | ICD-10-CM | POA: Diagnosis not present

## 2016-02-03 DIAGNOSIS — M2550 Pain in unspecified joint: Secondary | ICD-10-CM

## 2016-02-03 DIAGNOSIS — M779 Enthesopathy, unspecified: Secondary | ICD-10-CM | POA: Diagnosis not present

## 2016-02-03 DIAGNOSIS — Z0000 Encounter for general adult medical examination without abnormal findings: Secondary | ICD-10-CM | POA: Diagnosis not present

## 2016-02-03 DIAGNOSIS — Z13220 Encounter for screening for lipoid disorders: Secondary | ICD-10-CM | POA: Diagnosis not present

## 2016-02-03 DIAGNOSIS — Z1159 Encounter for screening for other viral diseases: Secondary | ICD-10-CM

## 2016-02-03 DIAGNOSIS — Z8249 Family history of ischemic heart disease and other diseases of the circulatory system: Secondary | ICD-10-CM | POA: Diagnosis not present

## 2016-02-03 DIAGNOSIS — Z8739 Personal history of other diseases of the musculoskeletal system and connective tissue: Secondary | ICD-10-CM

## 2016-02-03 LAB — CBC WITH DIFFERENTIAL/PLATELET
BASOS PCT: 1 % (ref 0–1)
Basophils Absolute: 0 10*3/uL (ref 0.0–0.1)
EOS ABS: 0 10*3/uL (ref 0.0–0.7)
Eosinophils Relative: 1 % (ref 0–5)
HCT: 43.7 % (ref 39.0–52.0)
HEMOGLOBIN: 15.1 g/dL (ref 13.0–17.0)
LYMPHS PCT: 31 % (ref 12–46)
Lymphs Abs: 1 10*3/uL (ref 0.7–4.0)
MCH: 33.9 pg (ref 26.0–34.0)
MCHC: 34.6 g/dL (ref 30.0–36.0)
MCV: 98 fL (ref 78.0–100.0)
MONO ABS: 0.3 10*3/uL (ref 0.1–1.0)
MPV: 11.4 fL (ref 8.6–12.4)
Monocytes Relative: 9 % (ref 3–12)
Neutro Abs: 1.9 10*3/uL (ref 1.7–7.7)
Neutrophils Relative %: 58 % (ref 43–77)
PLATELETS: 134 10*3/uL — AB (ref 150–400)
RBC: 4.46 MIL/uL (ref 4.22–5.81)
RDW: 12.7 % (ref 11.5–15.5)
WBC: 3.2 10*3/uL — AB (ref 4.0–10.5)

## 2016-02-03 LAB — POCT URINALYSIS DIP (MANUAL ENTRY)
Bilirubin, UA: NEGATIVE
Blood, UA: NEGATIVE
Glucose, UA: NEGATIVE
Ketones, POC UA: NEGATIVE
LEUKOCYTES UA: NEGATIVE
NITRITE UA: NEGATIVE
PH UA: 5.5
PROTEIN UA: NEGATIVE
Spec Grav, UA: 1.01
UROBILINOGEN UA: 0.2

## 2016-02-03 LAB — POC MICROSCOPIC URINALYSIS (UMFC): MUCUS RE: ABSENT

## 2016-02-03 LAB — COMPLETE METABOLIC PANEL WITH GFR
ALBUMIN: 4.3 g/dL (ref 3.6–5.1)
ALT: 31 U/L (ref 9–46)
AST: 25 U/L (ref 10–35)
Alkaline Phosphatase: 48 U/L (ref 40–115)
BUN: 19 mg/dL (ref 7–25)
CHLORIDE: 103 mmol/L (ref 98–110)
CO2: 26 mmol/L (ref 20–31)
Calcium: 9.1 mg/dL (ref 8.6–10.3)
Creat: 0.97 mg/dL (ref 0.70–1.33)
GFR, Est African American: >89 mL/min (ref >=60)
GFR, Est Non African American: 88 mL/min (ref >=60)
GLUCOSE: 100 mg/dL — AB (ref 65–99)
POTASSIUM: 4.3 mmol/L (ref 3.5–5.3)
SODIUM: 139 mmol/L (ref 135–146)
Total Bilirubin: 0.7 mg/dL (ref 0.2–1.2)
Total Protein: 7.2 g/dL (ref 6.1–8.1)

## 2016-02-03 LAB — LIPID PANEL
CHOL/HDL RATIO: 4.2 ratio (ref <=5.0)
Cholesterol: 172 mg/dL (ref 125–200)
HDL: 41 mg/dL (ref >=40)
LDL CALC: 73 mg/dL (ref <130)
Triglycerides: 291 mg/dL — ABNORMAL HIGH (ref <150)
VLDL: 58 mg/dL — AB (ref <30)

## 2016-02-03 LAB — URIC ACID: Uric Acid, Serum: 9.1 mg/dL — ABNORMAL HIGH (ref 4.0–7.8)

## 2016-02-03 LAB — MAGNESIUM: MAGNESIUM: 1.9 mg/dL (ref 1.5–2.5)

## 2016-02-03 LAB — TSH: TSH: 3.8 m[IU]/L (ref 0.40–4.50)

## 2016-02-03 MED ORDER — PROBENECID 500 MG PO TABS: 500.0000 mg | ORAL_TABLET | Freq: Every day | ORAL | Status: DC

## 2016-02-03 MED ORDER — COLCHICINE 0.6 MG PO TABS: ORAL_TABLET | ORAL | Status: DC

## 2016-02-03 MED ORDER — DICLOFENAC SODIUM 1 % TD GEL: 1.0000 "application " | Freq: Two times a day (BID) | TRANSDERMAL | Status: DC

## 2016-02-03 NOTE — Progress Notes (Signed)
Patient ID: Brendan Short, male   DOB: Jan 19, 1961, 55 y.o.   MRN: [id number]     By signing my name below, I, Brendan Short, attest that this documentation has been prepared under the direction and in the presence of [name], MD.  Electronically Signed: Zola Short, Medical Scribe. 02/03/2016. 2:16 PM.   Chief Complaint:  Chief Complaint  Patient presents with  . Annual Exam  . Medication Refill    HPI: Brendan Short is a 55 y.o. male with a history of gout and elevated PSA who reports to [facility] today for a complete physical exam. Patient saw his dermatologist a few weeks ago and had a full body examination, which was normal. He sees Dr. [name] once a year. His last colonoscopy was about 1 year ago, done by Dr. Benson Norway, and he was told to follow-up in 5 years. He has not seen an eye doctor recently. His last Tdap was in 2011. Patient reports drinking beer occasionally, sometimes after work or on weekends.  Patient is inquiring about magnesium and if it will help with his joint pains. His gout has mostly been controlled with the medications. Alcohol and seafood had been gout triggers for him. His gout had also been triggered by black olives before.  Past Medical History  Diagnosis Date  . Allergy   . Heart murmur    Past Surgical History  Procedure Laterality Date  . Hernia repair    . Broken nose  1970  . Broken nose surgery, in the early 70"s     Social History   Social History  . Marital Status: Married    Spouse Name: N/A  . Number of Children: N/A  . Years of Education: N/A   Occupational History  . Landscaper    Social History Main Topics  . Smoking status: Never Smoker   . Smokeless tobacco: Not on file  . Alcohol Use: 0.0 oz/week    0 Standard drinks or equivalent per week     Comment: few beers  . Drug Use: No  . Sexual Activity: Yes   Other Topics Concern  . Not on file   Social History Narrative   Married   Education: College   Exercise: Yes   Family  History  Problem Relation Age of Onset  . Heart disease Mother   . Cancer Father   . Heart disease Maternal Grandmother    Allergies  Allergen Reactions  . Allopurinol    Prior to Admission medications   Medication Sig Start Date End Date Taking? Authorizing Provider  cholecalciferol (VITAMIN D) 1000 UNITS tablet Take 1,000 Units by mouth daily.    Historical Provider, MD  colchicine (COLCRYS) 0.6 MG tablet TAKE 1 TABLET BY MOUTH EVERY DAY Patient not taking: Reported on 09/29/2015 01/26/15   [name], MD  diclofenac sodium (VOLTAREN) 1 % GEL Apply 1 application topically 2 (two) times daily. 08/10/14   [name], MD  montelukast (SINGULAIR) 10 MG tablet Take 1 tablet (10 mg total) by mouth at bedtime. Patient not taking: Reported on 09/29/2015 01/26/15   [name], MD  probenecid (BENEMID) 500 MG tablet Take 1 tablet (500 mg total) by mouth daily. 01/26/15   [name], MD     ROS: The patient denies fevers, chills, night sweats, unintentional weight loss, chest pain, palpitations, wheezing, dyspnea on exertion, nausea, vomiting, abdominal pain, dysuria, hematuria, melena, numbness, weakness, or tingling.   All other systems have been reviewed and  were otherwise negative with the exception of those mentioned in the HPI and as above.    PHYSICAL EXAM: Filed Vitals:   02/03/16 1401  BP: 114/76  Pulse: 73  Temp: 97.9 F (36.6 C)  Resp: 16   Body mass index is 26.31 kg/(m^2).   General: Alert, no acute distress HEENT:  Normocephalic, atraumatic, oropharynx patent. Eye: [name] [facility] Cardiovascular:  Regular rate and rhythm, no rubs murmurs or gallops.  No Carotid bruits, radial pulse intact. No pedal edema.  Respiratory: Clear to auscultation bilaterally.  No wheezes, rales, or rhonchi.  No cyanosis, no use of accessory musculature Abdominal: No organomegaly, abdomen is soft and non-tender, positive bowel sounds.  No masses. Musculoskeletal: Gait intact. No edema,  tenderness. Degenerative changes in his joints and hands. Skin: No rashes. Neurologic: Facial musculature symmetric. Psychiatric: Patient acts appropriately throughout our interaction. Lymphatic: No cervical or submandibular lymphadenopathy   LABS:    EKG/XRAY:   Primary read interpreted by Dr. [facility]lene Farrier at [facility].   ASSESSMENT/PLAN: Patient overall doing well. He continues to struggle with joint pain. He drinks at a moderate level episodically. He tends to run a low white count and borderline low platelets. He currently is on probenecid and colchicine for treatment of his gout. He sees Dr. Marella Chimes stent for his regular prostate checks. Routine labs were done today along with a magnesium level.I personally performed the services described in this documentation, which was scribed in my presence. The recorded information has been reviewed and is accurate.    Gross sideeffects, risk and benefits, and alternatives of medications d/w patient. Patient is aware that all medications have potential sideeffects and we are unable to predict every sideeffect or drug-drug interaction that may occur.  [name] MD 02/03/2016 2:16 PM

## 2016-02-03 NOTE — Patient Instructions (Signed)
     IF you received an x-ray today, you will receive an invoice from [facility]. Please contact  Radiology at [phone] with questions or concerns regarding your invoice.   IF you received labwork today, you will receive an invoice from [facility]. Please contact [facility] at [phone] with questions or concerns regarding your invoice.   Our billing staff will not be able to assist you with questions regarding bills from these companies.  You will be contacted with the lab results as soon as they are available. The fastest way to get your results is to activate your My Chart account. Instructions are located on the last page of this paperwork. If you have not heard from us regarding the results in 2 weeks, please contact this office.      

## 2016-02-04 ENCOUNTER — Other Ambulatory Visit: Payer: Self-pay | Admitting: Emergency Medicine

## 2016-02-04 DIAGNOSIS — D696 Thrombocytopenia, unspecified: Secondary | ICD-10-CM

## 2016-02-04 DIAGNOSIS — D72819 Decreased white blood cell count, unspecified: Secondary | ICD-10-CM

## 2016-02-04 LAB — HEPATITIS C ANTIBODY: HCV Ab: NEGATIVE

## 2016-02-07 NOTE — Addendum Note (Signed)
Addended by: Wyatt Haste on: 02/07/2016 08:04 AM   Modules accepted: Miquel Dunn

## 2016-02-10 ENCOUNTER — Other Ambulatory Visit: Payer: Self-pay | Admitting: Emergency Medicine

## 2016-02-10 DIAGNOSIS — H539 Unspecified visual disturbance: Secondary | ICD-10-CM

## 2016-02-19 ENCOUNTER — Other Ambulatory Visit: Payer: Self-pay | Admitting: Emergency Medicine

## 2016-02-21 ENCOUNTER — Other Ambulatory Visit: Payer: Self-pay

## 2016-02-21 DIAGNOSIS — Z8739 Personal history of other diseases of the musculoskeletal system and connective tissue: Secondary | ICD-10-CM

## 2016-02-21 MED ORDER — COLCHICINE 0.6 MG PO TABS: ORAL_TABLET | ORAL | Status: DC

## 2016-02-21 MED ORDER — PROBENECID 500 MG PO TABS: 500.0000 mg | ORAL_TABLET | Freq: Every day | ORAL | Status: DC

## 2016-03-14 ENCOUNTER — Encounter: Payer: Self-pay | Admitting: Hematology & Oncology

## 2016-03-14 ENCOUNTER — Other Ambulatory Visit (HOSPITAL_BASED_OUTPATIENT_CLINIC_OR_DEPARTMENT_OTHER): Payer: Commercial Managed Care - HMO

## 2016-03-14 ENCOUNTER — Ambulatory Visit: Payer: Commercial Managed Care - HMO

## 2016-03-14 ENCOUNTER — Ambulatory Visit (HOSPITAL_BASED_OUTPATIENT_CLINIC_OR_DEPARTMENT_OTHER): Payer: Commercial Managed Care - HMO | Admitting: Hematology & Oncology

## 2016-03-14 VITALS — BP 113/88 | HR 75 | Temp 98.3°F | Resp 16 | Ht 74.0 in | Wt 206.0 lb

## 2016-03-14 DIAGNOSIS — Z809 Family history of malignant neoplasm, unspecified: Secondary | ICD-10-CM

## 2016-03-14 DIAGNOSIS — Z8249 Family history of ischemic heart disease and other diseases of the circulatory system: Secondary | ICD-10-CM | POA: Diagnosis not present

## 2016-03-14 DIAGNOSIS — D696 Thrombocytopenia, unspecified: Secondary | ICD-10-CM | POA: Diagnosis not present

## 2016-03-14 DIAGNOSIS — R161 Splenomegaly, not elsewhere classified: Secondary | ICD-10-CM

## 2016-03-14 DIAGNOSIS — D72819 Decreased white blood cell count, unspecified: Secondary | ICD-10-CM

## 2016-03-14 DIAGNOSIS — Z7289 Other problems related to lifestyle: Secondary | ICD-10-CM

## 2016-03-14 DIAGNOSIS — D702 Other drug-induced agranulocytosis: Secondary | ICD-10-CM | POA: Insufficient documentation

## 2016-03-14 HISTORY — DX: Thrombocytopenia, unspecified: D69.6

## 2016-03-14 HISTORY — DX: Other drug-induced agranulocytosis: D70.2

## 2016-03-14 LAB — CBC WITH DIFFERENTIAL (CANCER CENTER ONLY)
BASO#: 0 10*3/uL (ref 0.0–0.2)
BASO%: 0.5 % (ref 0.0–2.0)
EOS ABS: 0 10*3/uL (ref 0.0–0.5)
EOS%: 1 % (ref 0.0–7.0)
HCT: 40.8 % (ref 38.7–49.9)
HGB: 14.6 g/dL (ref 13.0–17.1)
LYMPH#: 1.2 10*3/uL (ref 0.9–3.3)
LYMPH%: 29.6 % (ref 14.0–48.0)
MCH: 34.4 pg — AB (ref 28.0–33.4)
MCHC: 35.8 g/dL (ref 32.0–35.9)
MCV: 96 fL (ref 82–98)
MONO#: 0.4 10*3/uL (ref 0.1–0.9)
MONO%: 9 % (ref 0.0–13.0)
NEUT#: 2.4 10*3/uL (ref 1.5–6.5)
NEUT%: 59.9 % (ref 40.0–80.0)
RBC: 4.24 10*6/uL (ref 4.20–5.70)
RDW: 11.7 % (ref 11.1–15.7)
WBC: 4 10*3/uL (ref 4.0–10.0)

## 2016-03-14 LAB — CHCC SATELLITE - SMEAR

## 2016-03-14 NOTE — Progress Notes (Signed)
Referral MD  Reason for Referral: Leukopenia and thrombocytopenia   Chief Complaint  Patient presents with  . Other    New Patient  : I was told that my blood counts are low.  HPI: Brendan Short is a very nice 55 year old white male. He works for the city [employer]. He has he works in the [place]. He has a really cool job.  He's had issues with gout in the past. He has been on colchicine and probenecid. He has not had a flareup for a while.  He's had no problems with bleeding. Has been no bruising.  He sees Dr. Everlene Farrier. He went ahead and ran some labs on him He does  have some leukopenia and thrombocytopenia.  Back in July 2013, his plate count with [phone]. His white cell count 4.1. He had a normal white cell differential.  In March 2016, his white cell count was 3.7. His hemoglobin 14.4 and platelet count 142,000. Again, the white cell differential was normal.  This year in March, his platelet count 134,000. His white cell count 3.2. At this point, it was felt that he needed be seen by hematology.  He does have severe alcohol use. He 5 drinks a couple beers a day. He does not smoke.  There is no change in bowel or bladder habits. His artery had a couple colonoscopies.  There is no family history of blood problems.  He's had no rashes. He's had no weight loss or weight gain. He's had no fatigue. He still is working pretty much full-time.  He's had no problems with swollen nodes. There's been no swallowing difficulties.  Overall, his performance status is ECOG 0.    Past Medical History  Diagnosis Date  . Allergy   . Heart murmur   . 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 Thrombocytopenia ([place]) 03/14/2016  :  Past Surgical History  Procedure Laterality Date  . Hernia repair    . Broken nose  1970  . Broken nose surgery, in the early 70"s    :   Current outpatient prescriptions:  .  cholecalciferol (VITAMIN D) 1000 UNITS tablet, Take 1,000 Units by mouth  daily., Disp: , Rfl:  .  colchicine (COLCRYS) 0.6 MG tablet, TAKE 1 TABLET BY MOUTH EVERY DAY, Disp: 30 tablet, Rfl: 11 .  diclofenac sodium (VOLTAREN) 1 % GEL, Apply 1 application topically 2 (two) times daily., Disp: 100 g, Rfl: 11 .  probenecid (BENEMID) 500 MG tablet, Take 1 tablet (500 mg total) by mouth daily., Disp: 30 tablet, Rfl: 11:  :  Allergies  Allergen Reactions  . Allopurinol   :  Family History  Problem Relation Age of Onset  . Heart disease Mother   . Cancer Father   . Heart disease Maternal Grandmother   :  Social History   Social History  . Marital Status: Married    Spouse Name: N/A  . Number of Children: N/A  . Years of Education: N/A   Occupational History  . Landscaper    Social History Main Topics  . Smoking status: Never Smoker   . Smokeless tobacco: Not on file  . Alcohol Use: 0.0 oz/week    0 Standard drinks or equivalent per week     Comment: few beers  . Drug Use: No  . Sexual Activity: Yes   Other Topics Concern  . Not on file   Social History Narrative   Married   Education: College   Exercise: Yes  :  Pertinent  items are noted in HPI.  Exam: @IPVITALS @ Well-developed and well-nourished white male in no obvious distress. Vital signs her temperature of 98.3. Pulse 75. Blood pressure 113/88. Weight is 206 pounds. Head and neck exam shows a normocephalic and atraumatic skull. He has no ocular or oral lesions. He has no palpable cervical or supraclavicular lymph nodes. Thyroid is nonpalpable. Lungs are clear bilaterally. Cardiac exam regular rate and rhythm with no murmurs, rubs or bruits. Abdomen is soft. He has good bowel sounds. There is no fluid wave. There is no palpable hepatomegaly. His spleen tip is palpable with deep inspiration. Back exam shows no tenderness over the spine, ribs or hips. Extremities shows no clubbing, cyanosis or edema. Skin exam shows no rashes, ecchymoses or petechia. Neurological exam shows no focal  neurological deficits.    Recent Labs  03/14/16 1030  WBC 4.0  HGB 14.6  HCT 40.8  PLT 111 Platelet count consistent in citrate*   No results for input(s): NA, K, CL, CO2, GLUCOSE, BUN, CREATININE, CALCIUM in the last 72 hours.  Blood smear review:  Normochromic and normocytic population of red blood cells. There are no nucleated blood cells. There are no teardrop cells. He has no schistocytes or spherocytes. White blood cells show good maturation. He has no hypersegmented polys. There is no immature myeloid or lymphoid forms. Platelets are slightly decreased in number. Platelets are well granulated. He has several large platelets.  Pathology: None     Assessment and Plan:  Brendan Short is a very nice 55 year old white male. He has mild thrombocytopenia.  By his blood smear, I would think that this would be more of a distractive or consumptive process. He does have several large platelets.  I think that he may have some splenomegaly.  I worry that there may be some liver issues. There may be some underlying mild cirrhosis that might be causing splenomegaly.  I think it be worth while doing an ultrasound of the abdomen to assess for his spleen and liver.  I do not see any the other blood smear that would suggest a lymphoproliferative process. I don't think we had to do any flow cytometry on the blood. However, if his platelet count continues to drop, we may have to consider flow cytometry but also a bone marrow biopsy.  I told him that he really has to go back on the alcohol use. I don't think this will be a problem for him.  I just don't see any malignancy that is causing the thrombocytopenia.  I told him to avoid nonsteroidals. I think if he has any problems with pain, Tylenol would be reasonable.  I will like to see him back in about 2 months area and we will see what the ultrasound of the spleen shows.  I spent 45 minutes with him and answered all of his questions.

## 2016-03-17 ENCOUNTER — Encounter (HOSPITAL_COMMUNITY): Payer: Self-pay

## 2016-03-17 ENCOUNTER — Ambulatory Visit (HOSPITAL_COMMUNITY)
Admission: RE | Admit: 2016-03-17 | Discharge: 2016-03-17 | Disposition: A | Discharge status: Home / Self Care | Payer: Commercial Managed Care - HMO | Source: Ambulatory Visit | Attending: Hematology & Oncology | Admitting: Hematology & Oncology

## 2016-03-17 DIAGNOSIS — R161 Splenomegaly, not elsewhere classified: Secondary | ICD-10-CM | POA: Diagnosis not present

## 2016-03-17 DIAGNOSIS — D72819 Decreased white blood cell count, unspecified: Secondary | ICD-10-CM | POA: Insufficient documentation

## 2016-03-17 DIAGNOSIS — K7460 Unspecified cirrhosis of liver: Secondary | ICD-10-CM | POA: Diagnosis not present

## 2016-03-17 DIAGNOSIS — D702 Other drug-induced agranulocytosis: Secondary | ICD-10-CM | POA: Insufficient documentation

## 2016-03-17 DIAGNOSIS — D696 Thrombocytopenia, unspecified: Secondary | ICD-10-CM | POA: Insufficient documentation

## 2016-03-22 ENCOUNTER — Other Ambulatory Visit: Payer: Self-pay | Admitting: Hematology & Oncology

## 2016-03-22 DIAGNOSIS — R160 Hepatomegaly, not elsewhere classified: Secondary | ICD-10-CM

## 2016-03-22 DIAGNOSIS — K7469 Other cirrhosis of liver: Secondary | ICD-10-CM

## 2016-03-23 ENCOUNTER — Other Ambulatory Visit: Payer: Self-pay | Admitting: Family

## 2016-03-23 DIAGNOSIS — D696 Thrombocytopenia, unspecified: Secondary | ICD-10-CM

## 2016-03-23 DIAGNOSIS — D702 Other drug-induced agranulocytosis: Secondary | ICD-10-CM

## 2016-03-24 ENCOUNTER — Ambulatory Visit (HOSPITAL_COMMUNITY)
Admission: RE | Admit: 2016-03-24 | Discharge: 2016-03-24 | Disposition: A | Discharge status: Home / Self Care | Payer: Commercial Managed Care - HMO | Source: Ambulatory Visit | Attending: Family | Admitting: Family

## 2016-03-24 DIAGNOSIS — D702 Other drug-induced agranulocytosis: Secondary | ICD-10-CM | POA: Diagnosis present

## 2016-03-24 DIAGNOSIS — D696 Thrombocytopenia, unspecified: Secondary | ICD-10-CM | POA: Diagnosis present

## 2016-03-24 DIAGNOSIS — N281 Cyst of kidney, acquired: Secondary | ICD-10-CM | POA: Insufficient documentation

## 2016-03-24 DIAGNOSIS — K760 Fatty (change of) liver, not elsewhere classified: Secondary | ICD-10-CM | POA: Insufficient documentation

## 2016-03-24 DIAGNOSIS — K7689 Other specified diseases of liver: Secondary | ICD-10-CM | POA: Insufficient documentation

## 2016-03-24 MED ORDER — GADOBENATE DIMEGLUMINE 529 MG/ML IV SOLN: 20.0000 mL | Freq: Once | INTRAVENOUS | Status: DC | PRN

## 2016-03-24 MED ORDER — GADOBENATE DIMEGLUMINE 529 MG/ML IV SOLN
19.0000 mL | Freq: Once | INTRAVENOUS | Status: AC | PRN
  Administered 2016-03-24: 19 mL via INTRAVENOUS

## 2016-03-27 ENCOUNTER — Telehealth: Payer: Self-pay | Admitting: Family

## 2016-03-27 NOTE — Telephone Encounter (Signed)
Spoke with Brendan Short over the phone and discussed his abdominal MRI. He was found to have hepatic steatosis. No cirrhosis or ascites. Spleen normal. He will try to start limiting his fat intake and make sure he is well hydrated. He is in agreement with the plan. We will plan to see him at his follow-up appointment in July.

## 2016-05-17 ENCOUNTER — Ambulatory Visit (HOSPITAL_BASED_OUTPATIENT_CLINIC_OR_DEPARTMENT_OTHER): Payer: Commercial Managed Care - HMO | Admitting: Hematology & Oncology

## 2016-05-17 ENCOUNTER — Other Ambulatory Visit (HOSPITAL_BASED_OUTPATIENT_CLINIC_OR_DEPARTMENT_OTHER): Payer: Commercial Managed Care - HMO

## 2016-05-17 ENCOUNTER — Encounter: Payer: Self-pay | Admitting: Hematology & Oncology

## 2016-05-17 VITALS — BP 135/83 | HR 54 | Temp 97.9°F | Resp 20 | Ht 74.0 in | Wt 198.0 lb

## 2016-05-17 DIAGNOSIS — D72819 Decreased white blood cell count, unspecified: Secondary | ICD-10-CM

## 2016-05-17 DIAGNOSIS — R161 Splenomegaly, not elsewhere classified: Secondary | ICD-10-CM

## 2016-05-17 DIAGNOSIS — D696 Thrombocytopenia, unspecified: Secondary | ICD-10-CM

## 2016-05-17 DIAGNOSIS — D702 Other drug-induced agranulocytosis: Secondary | ICD-10-CM

## 2016-05-17 LAB — CBC WITH DIFFERENTIAL (CANCER CENTER ONLY)
BASO#: 0 10*3/uL (ref 0.0–0.2)
BASO%: 0.2 % (ref 0.0–2.0)
EOS ABS: 0.1 10*3/uL (ref 0.0–0.5)
EOS%: 1.9 % (ref 0.0–7.0)
HCT: 43.8 % (ref 38.7–49.9)
HEMOGLOBIN: 15.2 g/dL (ref 13.0–17.1)
LYMPH#: 1.4 10*3/uL (ref 0.9–3.3)
LYMPH%: 34.3 % (ref 14.0–48.0)
MCH: 34.6 pg — AB (ref 28.0–33.4)
MCHC: 34.7 g/dL (ref 32.0–35.9)
MCV: 100 fL — ABNORMAL HIGH (ref 82–98)
MONO#: 0.4 10*3/uL (ref 0.1–0.9)
MONO%: 9.4 % (ref 0.0–13.0)
NEUT%: 54.2 % (ref 40.0–80.0)
NEUTROS ABS: 2.3 10*3/uL (ref 1.5–6.5)
RBC: 4.39 10*6/uL (ref 4.20–5.70)
RDW: 12.1 % (ref 11.1–15.7)
WBC: 4.2 10*3/uL (ref 4.0–10.0)

## 2016-05-17 NOTE — Progress Notes (Signed)
Hematology and Oncology Follow Up Visit  Brendan Short [id number] [birth year] 55 y.o. 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 We saw him about 6 weeks ago. He is doing fairly well. He has had no problems since we last saw him. He's had no issues with gout.  He's had no bleeding or bruising. He has had no change in bowel or bladder habits.  He is trying to watch what he drinks. He is being more conscientious about this. We did do an MRI of his abdomen. This did show cirrhosis-type changes. I spent this is probably NASH. There is no splenomegaly. He had no mass in the liver.  He's had no cough or shortness of breath.  Overall, his performance status is ECOG 1.  Medications:  Current outpatient prescriptions:  .  cholecalciferol (VITAMIN D) 1000 UNITS tablet, Take 1,000 Units by mouth daily., Disp: , Rfl:  .  colchicine (COLCRYS) 0.6 MG tablet, TAKE 1 TABLET BY MOUTH EVERY DAY, Disp: 30 tablet, Rfl: 11 .  diclofenac sodium (VOLTAREN) 1 % GEL, Apply 1 application topically 2 (two) times daily., Disp: 100 g, Rfl: 11 .  probenecid (BENEMID) 500 MG tablet, Take 1 tablet (500 mg total) by mouth daily., Disp: 30 tablet, Rfl: 11  Allergies:  Allergies  Allergen Reactions  . Allopurinol     Past Medical History, Surgical history, Social history, and Family History were reviewed and updated.  Review of Systems: As above  Physical Exam:  height is 6' 2"  (1.88 m) and weight is 198 lb (89.812 kg). His oral temperature is 97.9 F (36.6 C). His blood pressure is 135/83 and his pulse is 54. His respiration is 20.   Wt Readings from Last 3 Encounters:  05/17/16 198 lb (89.812 kg)  03/14/16 206 lb (93.441 kg)  02/03/16 207 lb 9.6 oz (94.167 kg)     Head and neck exam shows a normocephalic and atraumatic skull. He has no ocular or oral lesions. He has no  palpable cervical or supraclavicular lymph nodes. Thyroid is nonpalpable. Lungs are clear bilaterally. Cardiac exam regular rate and rhythm with no murmurs, rubs or bruits. Abdomen is soft. He has good bowel sounds. There is no fluid wave. There is no palpable hepatomegaly. His spleen tip is palpable with deep inspiration. Back exam shows no tenderness over the spine, ribs or hips. Extremities shows no clubbing, cyanosis or edema. Skin exam shows no rashes, ecchymoses or petechia. Neurological exam shows no focal neurological deficits.   Lab Results  Component Value Date   WBC 4.2 05/17/2016   HGB 15.2 05/17/2016   HCT 43.8 05/17/2016   MCV 100* 05/17/2016   PLT 124 Platelet count consistent in citrate* 05/17/2016     Chemistry      Component Value Date/Time   NA 139 02/03/2016 1446   K 4.3 02/03/2016 1446   CL 103 02/03/2016 1446   CO2 26 02/03/2016 1446   BUN 19 02/03/2016 1446   CREATININE 0.97 02/03/2016 1446      Component Value Date/Time   CALCIUM 9.1 02/03/2016 1446   ALKPHOS 48 02/03/2016 1446   AST 25 02/03/2016 1446   ALT 31 02/03/2016 1446   BILITOT 0.7 02/03/2016 1446         Impression and Plan: Brendan Short is A 55 year old white male. He has mild thrombocytopenia. His platelet count is  better today.  I suppose this could still be related to medications. However, I highly suspect this probably is from some cirrhotic changes. Hopefully, he will continue to watch what he drinks and eats.  I the we can get him back in 4 months now. I then this would be reasonable for follow-up.  I'm just glad that he is doing well. I'm glad that his platelet count is improving.  I think that his plate count is improved next time we see him, that we might be able to let him go from the clinic.   Volanda Napoleon, MD 7/5/20178:22 AM

## 2016-09-22 ENCOUNTER — Ambulatory Visit: Payer: Commercial Managed Care - HMO | Admitting: Hematology & Oncology

## 2016-09-22 ENCOUNTER — Other Ambulatory Visit: Payer: Commercial Managed Care - HMO

## 2016-09-29 ENCOUNTER — Other Ambulatory Visit (HOSPITAL_BASED_OUTPATIENT_CLINIC_OR_DEPARTMENT_OTHER): Payer: Commercial Managed Care - HMO

## 2016-09-29 ENCOUNTER — Ambulatory Visit (HOSPITAL_BASED_OUTPATIENT_CLINIC_OR_DEPARTMENT_OTHER): Payer: Commercial Managed Care - HMO | Admitting: Hematology & Oncology

## 2016-09-29 VITALS — BP 135/90 | HR 92 | Temp 97.5°F | Resp 20 | Ht 74.0 in | Wt 201.8 lb

## 2016-09-29 DIAGNOSIS — D696 Thrombocytopenia, unspecified: Secondary | ICD-10-CM

## 2016-09-29 LAB — CBC WITH DIFFERENTIAL (CANCER CENTER ONLY)
BASO#: 0 10*3/uL (ref 0.0–0.2)
BASO%: 0.4 % (ref 0.0–2.0)
EOS%: 2.1 % (ref 0.0–7.0)
Eosinophils Absolute: 0.1 10*3/uL (ref 0.0–0.5)
HCT: 40.4 % (ref 38.7–49.9)
HGB: 14.2 g/dL (ref 13.0–17.1)
LYMPH#: 1.4 10*3/uL (ref 0.9–3.3)
LYMPH%: 29.1 % (ref 14.0–48.0)
MCH: 34.6 pg — ABNORMAL HIGH (ref 28.0–33.4)
MCHC: 35.1 g/dL (ref 32.0–35.9)
MCV: 99 fL — AB (ref 82–98)
MONO#: 0.5 10*3/uL (ref 0.1–0.9)
MONO%: 9.8 % (ref 0.0–13.0)
NEUT#: 2.8 10*3/uL (ref 1.5–6.5)
NEUT%: 58.6 % (ref 40.0–80.0)
PLATELETS: 127 10*3/uL — AB (ref 145–400)
RBC: 4.1 10*6/uL — AB (ref 4.20–5.70)
RDW: 11.7 % (ref 11.1–15.7)
WBC: 4.8 10*3/uL (ref 4.0–10.0)

## 2016-09-29 LAB — CHCC SATELLITE - SMEAR

## 2016-09-29 NOTE — Progress Notes (Signed)
Hematology and Oncology Follow Up Visit  NOAL ABSHIER [id number] 25-Jul-1961 55 y.o. 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 He looks quite good. He is still working pretty much full-time. He is had no problems with bleeding or bruising.  He is on daily colchicine with probenecid. He wants to know if he can possibly take this every other day. I think that this would be reasonable.  He has had no rashes. He's had no petechia. He's had no melena or bright red blood per rectum.  He's had no weight loss or weight gain.  He admits that he is still drinking some beer. He, however, is avoiding "hard liquor".  He's had no headache. He's had no fever. He's had no cough or shortness of breath.  Overall, his performance status is ECOG 0  Medications:  Current Outpatient Prescriptions:  .  cholecalciferol (VITAMIN D) 1000 UNITS tablet, Take 1,000 Units by mouth daily., Disp: , Rfl:  .  colchicine (COLCRYS) 0.6 MG tablet, TAKE 1 TABLET BY MOUTH EVERY DAY, Disp: 30 tablet, Rfl: 11 .  diclofenac sodium (VOLTAREN) 1 % GEL, Apply 1 application topically 2 (two) times daily., Disp: 100 g, Rfl: 11 .  probenecid (BENEMID) 500 MG tablet, Take 1 tablet (500 mg total) by mouth daily., Disp: 30 tablet, Rfl: 11  Allergies:  Allergies  Allergen Reactions  . Allopurinol     Past Medical History, Surgical history, Social history, and Family History were reviewed and updated.  Review of Systems: As above  Physical Exam:  height is 6' 2"  (1.88 m) and weight is 201 lb 12.8 oz (91.5 kg). His oral temperature is 97.5 F (36.4 C). His blood pressure is 135/90 and his pulse is 92. His respiration is 20.   Wt Readings from Last 3 Encounters:  09/29/16 201 lb 12.8 oz (91.5 kg)  05/17/16 198 lb (89.8 kg)  03/14/16 206 lb (93.4 kg)     Head and neck exam shows a  normocephalic and atraumatic skull. He has no ocular or oral lesions. He has no palpable cervical or supraclavicular lymph nodes. Thyroid is nonpalpable. Lungs are clear bilaterally. Cardiac exam regular rate and rhythm with no murmurs, rubs or bruits. Abdomen is soft. He has good bowel sounds. There is no fluid wave. There is no palpable hepatomegaly. His spleen tip is palpable with deep inspiration. Back exam shows no tenderness over the spine, ribs or hips. Extremities shows no clubbing, cyanosis or edema. Skin exam shows no rashes, ecchymoses or petechia. Neurological exam shows no focal neurological deficits.   Lab Results  Component Value Date   WBC 4.8 09/29/2016   HGB 14.2 09/29/2016   HCT 40.4 09/29/2016   MCV 99 (H) 09/29/2016   PLT 127 (L) 09/29/2016     Chemistry      Component Value Date/Time   NA 139 02/03/2016 1446   K 4.3 02/03/2016 1446   CL 103 02/03/2016 1446   CO2 26 02/03/2016 1446   BUN 19 02/03/2016 1446   CREATININE 0.97 02/03/2016 1446      Component Value Date/Time   CALCIUM 9.1 02/03/2016 1446   ALKPHOS 48 02/03/2016 1446   AST 25 02/03/2016 1446   ALT 31 02/03/2016 1446   BILITOT 0.7 02/03/2016 1446         Impression and Plan: Brendan Short is A 55 year old white  male. He has mild thrombocytopenia. His platelet count is better today.  I suppose this could still be related to medications. However, I highly suspect this probably is from some cirrhotic changes. Hopefully, he will continue to watch what he drinks and eats.  I the we can get him back in 6 months now. I think that this would be reasonable for follow-up.  I'm just glad that he is doing well. I'm glad that his platelet count is improving.   Brendan Napoleon, MD 11/17/201712:51 PM

## 2016-11-21 IMAGING — CR DG HIP (WITH OR WITHOUT PELVIS) 2-3V*L*
2 series · 2 of 2 positions shown · non-contrast
Comparison: None.

CLINICAL DATA: Left groin pain for 2 months.

EXAM:
LEFT HIP (WITH PELVIS) 2-3 VIEWS

[AP]
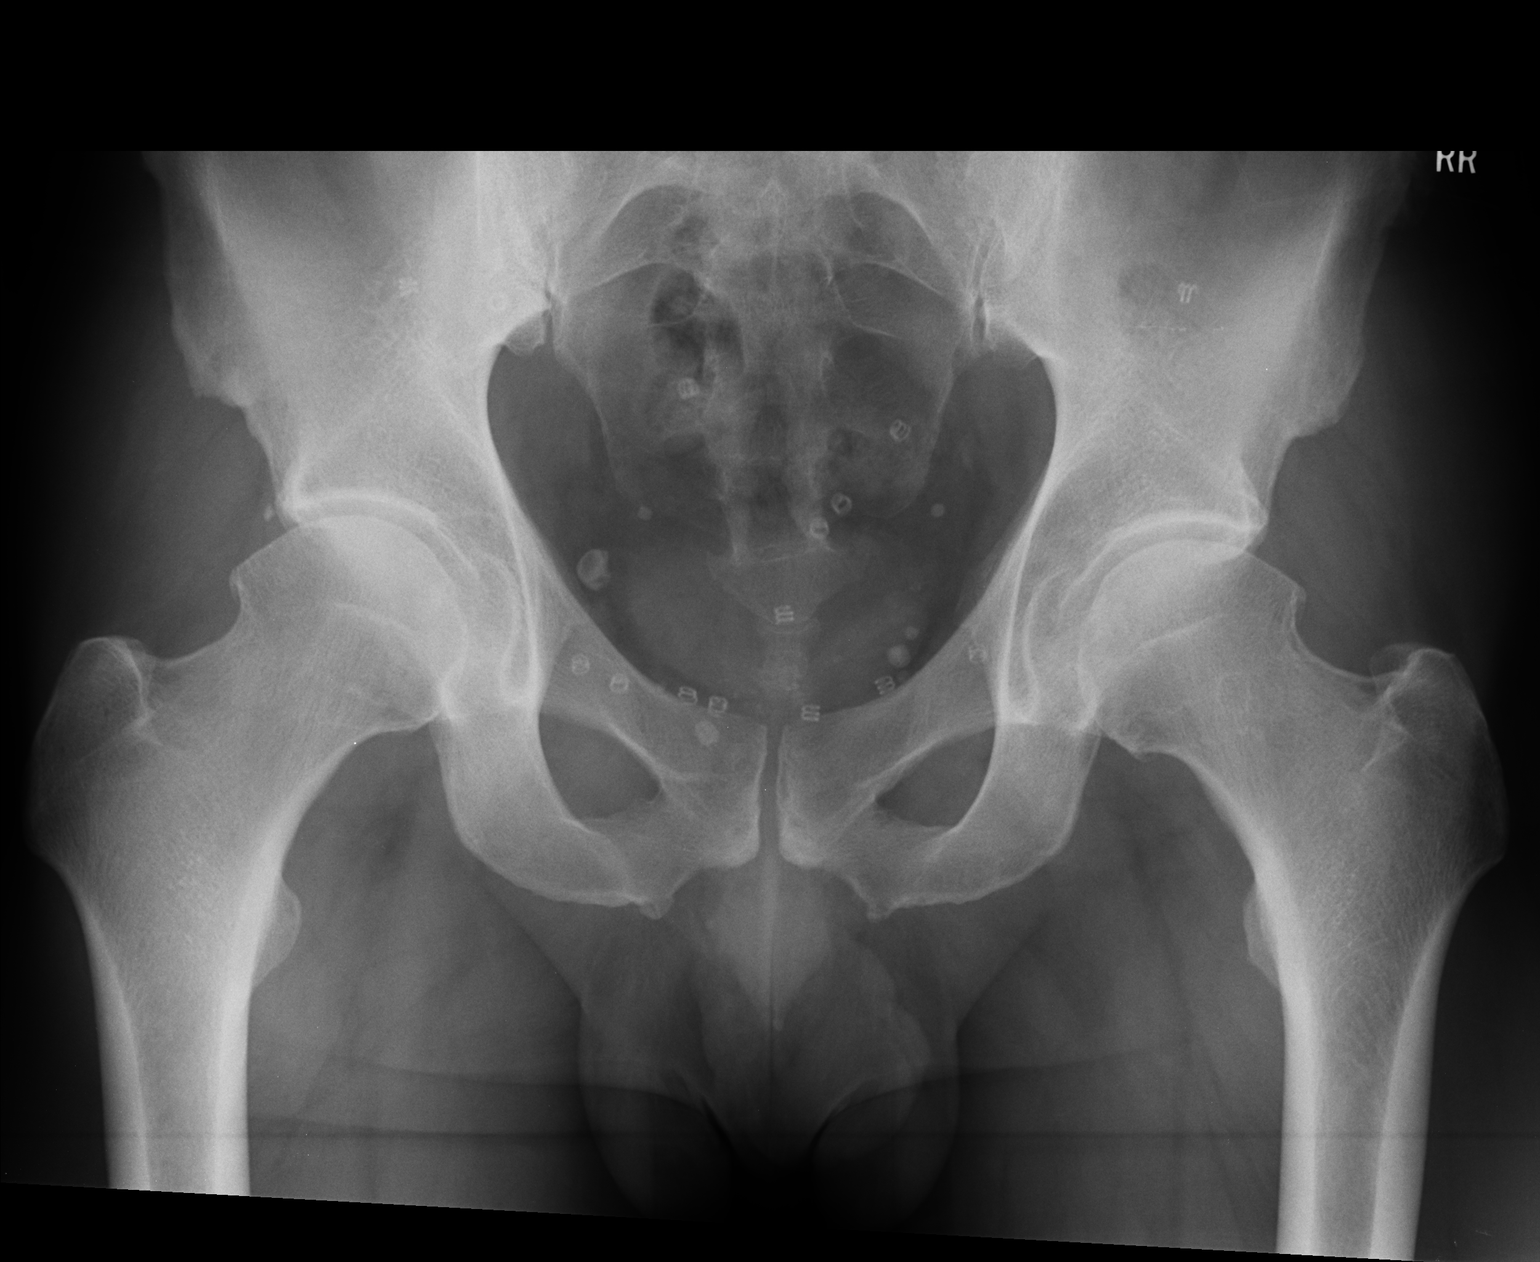

[lateral]
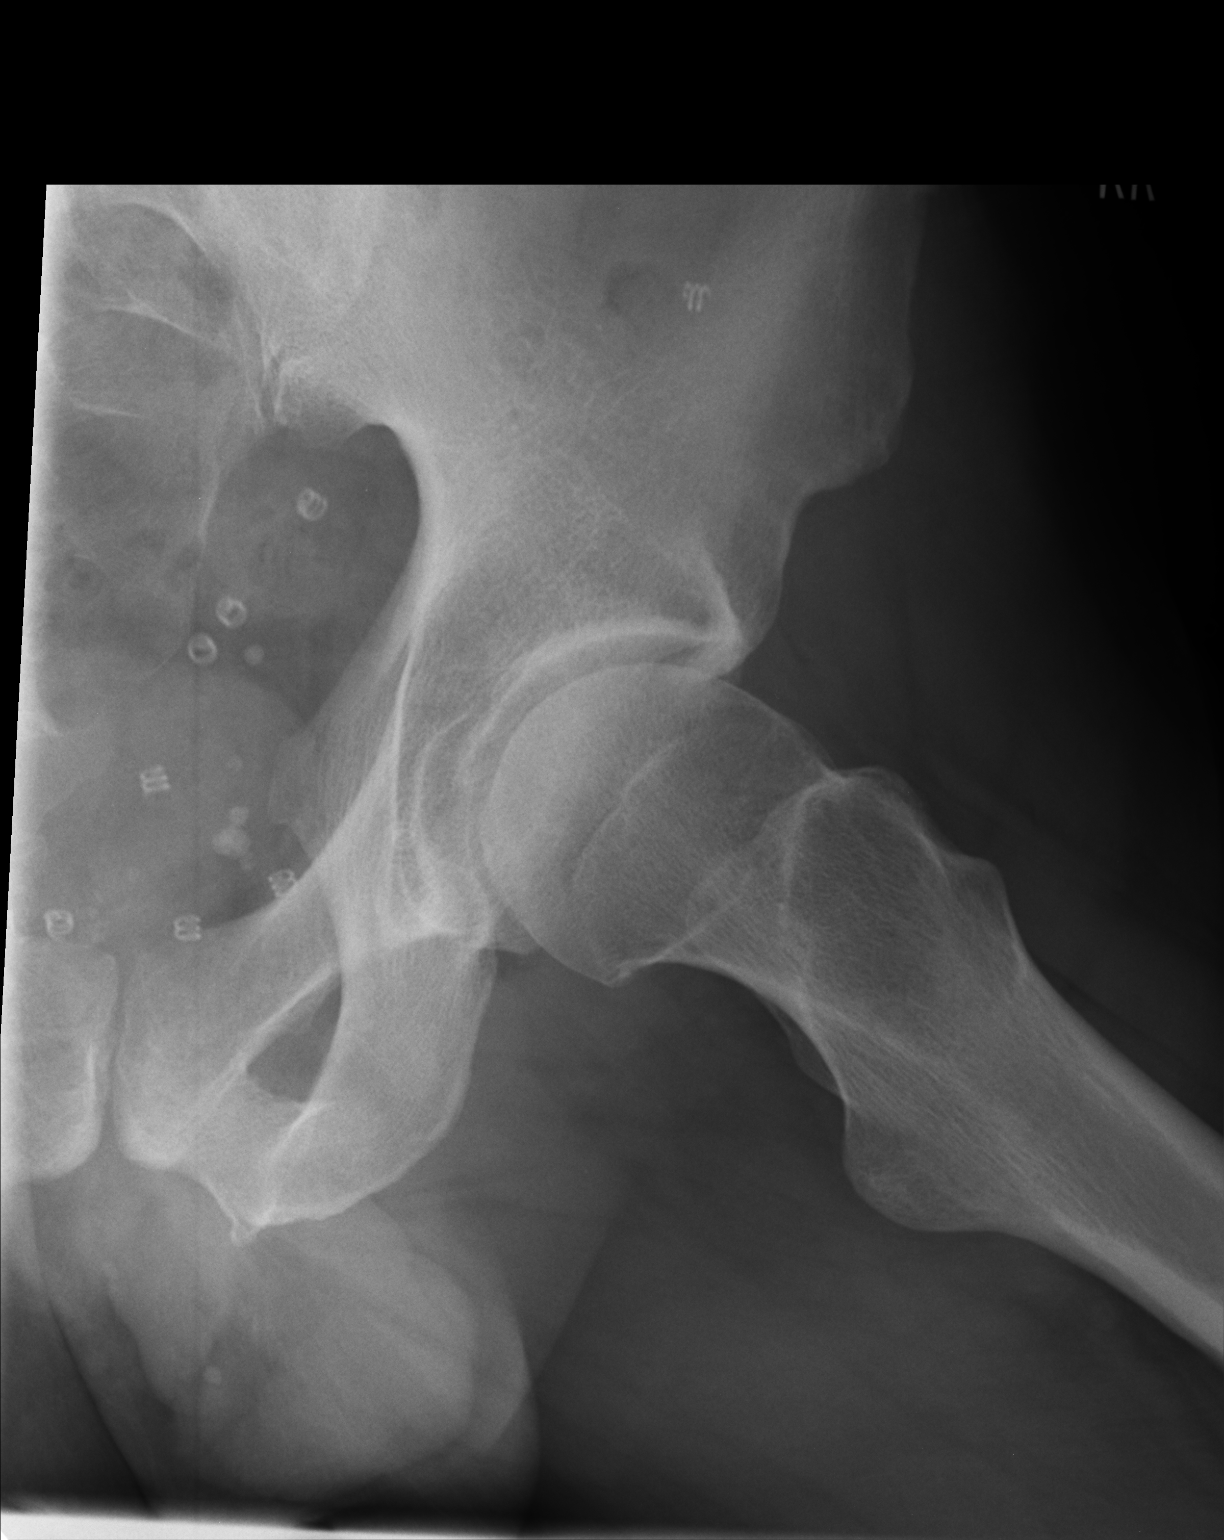

[2 of 2 positions shown; findings below may reference images not displayed]

FINDINGS: No acute bony or joint abnormality identified. Surgical coils in the
pelvis. Pelvic phleboliths. No acute bony or joint abnormality
identified.
IMPRESSION: No acute bony or joint abnormality.

## 2017-01-24 DIAGNOSIS — Q825 Congenital non-neoplastic nevus: Secondary | ICD-10-CM | POA: Diagnosis not present

## 2017-01-24 DIAGNOSIS — D225 Melanocytic nevi of trunk: Secondary | ICD-10-CM | POA: Diagnosis not present

## 2017-01-24 DIAGNOSIS — D2271 Melanocytic nevi of right lower limb, including hip: Secondary | ICD-10-CM | POA: Diagnosis not present

## 2017-02-03 ENCOUNTER — Other Ambulatory Visit: Payer: Self-pay | Admitting: Emergency Medicine

## 2017-02-03 DIAGNOSIS — Z8739 Personal history of other diseases of the musculoskeletal system and connective tissue: Secondary | ICD-10-CM

## 2017-02-03 NOTE — Telephone Encounter (Signed)
Last visit 01/2016

## 2017-02-08 ENCOUNTER — Ambulatory Visit (INDEPENDENT_AMBULATORY_CARE_PROVIDER_SITE_OTHER): Payer: Commercial Managed Care - HMO | Admitting: Family Medicine

## 2017-02-08 ENCOUNTER — Encounter: Payer: Self-pay | Admitting: Family Medicine

## 2017-02-08 VITALS — BP 126/75 | HR 61 | Temp 98.9°F | Resp 18 | Ht 74.0 in | Wt 203.0 lb

## 2017-02-08 DIAGNOSIS — J301 Allergic rhinitis due to pollen: Secondary | ICD-10-CM

## 2017-02-08 DIAGNOSIS — R040 Epistaxis: Secondary | ICD-10-CM

## 2017-02-08 DIAGNOSIS — M109 Gout, unspecified: Secondary | ICD-10-CM | POA: Diagnosis not present

## 2017-02-08 DIAGNOSIS — D696 Thrombocytopenia, unspecified: Secondary | ICD-10-CM | POA: Diagnosis not present

## 2017-02-08 DIAGNOSIS — Z0000 Encounter for general adult medical examination without abnormal findings: Secondary | ICD-10-CM

## 2017-02-08 DIAGNOSIS — Z114 Encounter for screening for human immunodeficiency virus [HIV]: Secondary | ICD-10-CM

## 2017-02-08 DIAGNOSIS — Z13220 Encounter for screening for lipoid disorders: Secondary | ICD-10-CM

## 2017-02-08 DIAGNOSIS — Z131 Encounter for screening for diabetes mellitus: Secondary | ICD-10-CM | POA: Diagnosis not present

## 2017-02-08 DIAGNOSIS — Z8739 Personal history of other diseases of the musculoskeletal system and connective tissue: Secondary | ICD-10-CM

## 2017-02-08 MED ORDER — PROBENECID 500 MG PO TABS: 500.0000 mg | ORAL_TABLET | Freq: Every day | ORAL | 3 refills | Status: DC

## 2017-02-08 MED ORDER — FLUTICASONE PROPIONATE 50 MCG/ACT NA SUSP: 1.0000 | Freq: Every day | NASAL | 6 refills | Status: DC

## 2017-02-08 MED ORDER — COLCHICINE 0.6 MG PO TABS: ORAL_TABLET | ORAL | 1 refills | Status: DC

## 2017-02-08 NOTE — Progress Notes (Signed)
By signing my name below, I, Mesha Guinyard, attest that this documentation has been prepared under the direction and in the presence of Merri Ray, MD.  Electronically Signed: Verlee Monte, Medical Scribe. 02/08/17. 3:14 PM.  Subjective:    Patient ID: Brendan Short, male    DOB: June 08, 1961, 56 y.o.   MRN: [id number]  HPI Chief Complaint  Patient presents with  . Annual Exam    HPI Comments: Brendan Short is a 56 y.o. male with a PMHx of gout, elevated PSA, and thrombocytopenia who presents to the Primary [facility] at [facility] for his annual physical.   ROS Complaints: Nosebleeds: Intermittent after waking up with a stuffy nose and after blowing his nose he'll get a "burst of blood"; doesn't occur all day and onset last fall. Pt has a gas furnace and his bed is directly under the vents. 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 Notes he broke his nose 2x when he was younger. Denies HA, bleeding gums, hematuria, and bruises/bleeding any were else. Seasonal Allergies: Pt used to get a shot when he was a teenager and has used steroid nasal sprays (flonase) in the past. Worse in the last Spring and early Summer.  Thrombocytopenia: Followed by Dr. Marin Olp. Thought to be due to NASH, medications, or possible cirrhotic changes. recommended monitoring diet and 6 months follow-up. Has biannual visits Lab Results  Component Value Date   WBC 4.8 09/29/2016   HGB 14.2 09/29/2016   HCT 40.4 09/29/2016   MCV 99 (H) 09/29/2016   PLT 127 (L) 09/29/2016   Elevated PSA: Followed by Dr. [name]. Planned for 1 year follow-up.  Gout: He takes probenecid 500 mg QD, as well as colchicine. Pt takes colchicine every other day and probenecid daily. Pt had few flares last year. Has been drinking black cherry juice, eating cherries when they're in season, drinking lots of water, and staying  away from seafood. Pt drinks on average 12 beers a week. He would drink 2-3 beers after work, but not daily, and occasionally on the weekends. He no longer drinks liquor. Lab Results  Component Value Date   LABURIC 9.1 (H) 02/03/2016   Cancer Screening: Prostate: PSA has been followed by [facility], Dr. [name]. No cancers or tumors found just an enlarged prostate. Pt has his PSA checked every 6 months. Lab Results  Component Value Date   PSA 3.17 02/10/2014   PSA 1.99 06/11/2012  Colon: Per note from physical in March 2017, colonoscopy approx Aug 20th 2015. Tubular adenoma x3 and repeat in 5 years. Skin: See annually and his last appt was 2 weeks ago. Appears to have a deviated septum to the right No active bleeding Some edema of the turbinates Immunizations: Pt receiving his flu shot from the city this season. Immunization History  Administered Date(s) Administered  . Influenza-Unspecified 10/14/2015  . Tdap 02/01/2010  . Zoster 11/13/2014   Vision: Pt is followed by Dr. Schuyler Amor with annual visit.  Visual Acuity Screening   Right eye Left eye Both eyes  Without correction: 20/20-1 20/25 20/25  With correction:      Dentist: Followed biannually by a dentist.  Exercise: Likes to walks, occasionally runs on the treadmill, plays golf on the weekends and complaints on episodic knee pain. 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 Reports struggling to breath on  humid days. Denies chest pain, SOB, or other sxs while exercising.  STI Testing: Agrees to HIV screening and denies the need for other STI screening.  Depression Screening: Depression screen [facility] 2/9 02/08/2017 02/03/2016 09/29/2015 01/26/2015 02/10/2014  Decreased Interest 0 0 0 0 0  Down, Depressed, Hopeless 0 0 0 0 0  PHQ - 2 Score 0 0 0 0 0   Patient Active Problem List   Diagnosis Date Noted  . 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 Thrombocytopenia ([place]) 03/14/2016  . Elevated PSA  03/20/2014  . Gout 06/11/2012   Past Medical History:  Diagnosis Date  . Allergy   . 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 Heart murmur   . Thrombocytopenia ([place]) 03/14/2016   Past Surgical History:  Procedure Laterality Date  . broken nose  1970  . broken nose surgery, in the early 70"s    . HERNIA REPAIR     Allergies  Allergen Reactions  . Allopurinol    Prior to Admission medications   Medication Sig Start Date End Date Taking? Authorizing Provider  cholecalciferol (VITAMIN D) 1000 UNITS tablet Take 1,000 Units by mouth daily.   Yes Historical Provider, MD  colchicine (COLCRYS) 0.6 MG tablet TAKE 1 TABLET BY MOUTH EVERY DAY 02/21/16  Yes [name], MD  diclofenac sodium (VOLTAREN) 1 % GEL Apply 1 application topically 2 (two) times daily. 02/03/16  Yes [name], MD  probenecid (BENEMID) 500 MG tablet Take 1 tablet (500 mg total) by mouth daily. 02/21/16  Yes [name], MD   Social History   Social History  . Marital status: Married    Spouse name: N/A  . Number of children: N/A  . Years of education: N/A   Occupational History  . Landscaper    Social History Main Topics  . Smoking status: Never Smoker  . Smokeless tobacco: Never Used  . Alcohol use 0.0 oz/week     Comment: few beers  . Drug use: No  . Sexual activity: Yes   Other Topics Concern  . Not on file   Social History Narrative   Married   Education: College   Exercise: Yes   Review of Systems  HENT: Positive for nosebleeds.   Allergic/Immunologic: Positive for environmental allergies.  13 point ROS negative other wise Objective:  Physical Exam  Constitutional: He is oriented to person, place, and time. He appears well-developed and well-nourished.  HENT:  Head: Normocephalic and atraumatic.  Right Ear: External ear normal.  Left Ear: External ear normal.  Mouth/Throat: Oropharynx is clear and moist.  Eyes: Conjunctivae and EOM are normal. Pupils are equal, round, and reactive  to light.  Neck: Normal range of motion. Neck supple. No thyromegaly present.  Cardiovascular: Normal rate, regular rhythm, normal heart sounds and intact distal pulses.   Pulmonary/Chest: Effort normal and breath sounds normal. No respiratory distress. He has no wheezes.  Abdominal: Soft. He exhibits no distension. There is no tenderness.  Musculoskeletal: Normal range of motion. He exhibits no edema or tenderness.  Lymphadenopathy:    He has no cervical adenopathy.  Neurological: He is alert and oriented to person, place, and time. He has normal reflexes.  Skin: Skin is warm and dry.  Psychiatric: He has a normal mood and affect. His behavior is normal.  Vitals reviewed.   Vitals:   02/08/17 1440  BP: 126/75  Pulse: 61  Resp: 18  Temp: 98.9 F (37.2 C)  TempSrc: Oral  SpO2: 96%  Weight:  203 lb (92.1 kg)  Height: 6' 2"  (1.88 m)   Body mass index is 26.06 kg/m. Assessment & Plan:    Brendan Short is a 56 y.o. male Annual physical exam  - -anticipatory guidance as below in AVS, screening labs above. Health maintenance items as above in HPI discussed/recommended as applicable.   Gout, unspecified cause, unspecified chronicity, unspecified site - Plan: Uric Acid, colchicine (COLCRYS) 0.6 MG tablet, probenecid (BENEMID) 500 MG tablet  - Appears stable. Continue probenecid daily, colchicine every other day. Check uric acid. Minimize alcohol intake and other foods that may flare gout.  Thrombocytopenia ([place]) - Plan: CBC with Differential/Platelet  - Followed by hematology with improved numbers recently. Repeat CBC. Nosebleeds, but no other easy bruising or bleeding/gum bleeding.  Seasonal allergic rhinitis due to pollen, unspecified chronicity - Plan: fluticasone (FLONASE) 50 MCG/ACT nasal spray  -humidifier in room where sleeping. Saline nasal spray, then Flonase nasal spray for allergies. Trigger avoidance discussed, and if nosebleeds persist, return for recheck. We'll also check  platelets on CBC as above  Screening for diabetes mellitus - Plan: Comprehensive metabolic panel  Screening for hyperlipidemia - Plan: Lipid panel  Nosebleed - Plan: CBC with Differential/Platelet  -May be related to allergies. Treatment as above, but RTC precautions if persistent.  Screening for HIV (human immunodeficiency virus) - Plan: HIV antibody    Meds ordered this encounter  Medications  . fluticasone (FLONASE) 50 MCG/ACT nasal spray    Sig: Place 1-2 sprays into both nostrils daily.    Dispense:  16 g    Refill:  6  . colchicine (COLCRYS) 0.6 MG tablet    Sig: TAKE 1 TABLET BY MOUTH EVERY other day    Dispense:  90 tablet    Refill:  1  . probenecid (BENEMID) 500 MG tablet    Sig: Take 1 tablet (500 mg total) by mouth daily.    Dispense:  90 tablet    Refill:  3   Patient Instructions   For nosebleeds - try humidifier in room where you sleep until heat in house is not being used,  saline nasal spray few times per day and in next week start flonase nasal spray 1 spray per nostril each day initially. If nosebleeds not improving in next few weeks, let me know and I can refer you to Ear Nose and Throat.    Continue follow up with dermatology, urology and hematologist as planned.   Try to limit alcohol to no more than 2 drinks per day for general health and for gout.   No change in gout meds for now, continue the colchicine every other day. Depending on level of uric acid, may be able to follow-up next year for repeat testing (unless you do have a repeat flare of gout, then follow-up sooner).    Keeping you healthy  Get these tests  Blood pressure- Have your blood pressure checked once a year by your healthcare provider.  Normal blood pressure is 120/80  Weight- Have your body mass index (BMI) calculated to screen for obesity.  BMI is a measure of body fat based on height and weight. You can also calculate your own BMI at ViewBanking.si.  Cholesterol- Have  your cholesterol checked every year.  Diabetes- Have your blood sugar checked regularly if you have high blood pressure, high cholesterol, have a family history of diabetes or if you are overweight.  Screening for Colon Cancer- Colonoscopy starting at age 50.  Screening may begin sooner depending  on your family history and other health conditions. Follow up colonoscopy as directed by your Gastroenterologist.  Screening for Prostate Cancer- Both blood work (PSA) and a rectal exam help screen for Prostate Cancer.  Screening begins at age 51 with African-American men and at age 37 with Caucasian men.  Screening may begin sooner depending on your family history.  Take these medicines  Aspirin- One aspirin daily can help prevent Heart disease and Stroke.  Flu shot- Every fall.  Tetanus- Every 10 years.  Zostavax- Once after the age of 71 to prevent Shingles.  Pneumonia shot- Once after the age of 68; if you are younger than 81, ask your healthcare provider if you need a Pneumonia shot.  Take these steps  Don't smoke- If you do smoke, talk to your doctor about quitting.  For tips on how to quit, go to www.smokefree.gov or call 1-800-QUIT-NOW.  Be physically active- Exercise 5 days a week for at least 30 minutes.  If you are not already physically active start slow and gradually work up to 30 minutes of moderate physical activity.  Examples of moderate activity include walking briskly, mowing the yard, dancing, swimming, bicycling, etc.  Eat a healthy diet- Eat a variety of healthy food such as fruits, vegetables, low fat milk, low fat cheese, yogurt, lean meant, poultry, fish, beans, tofu, etc. For more information go to www.thenutritionsource.org  Drink alcohol in moderation- Limit alcohol intake to less than two drinks a day. Never drink and drive.  Dentist- Brush and floss twice daily; visit your dentist twice a year.  Depression- Your emotional health is as important as your physical  health. If you're feeling down, or losing interest in things you would normally enjoy please talk to your healthcare provider.  Eye exam- Visit your eye doctor every year.  Safe sex- If you may be exposed to a sexually transmitted infection, use a condom.  Seat belts- Seat belts can save your life; always wear one.  Smoke/Carbon Monoxide detectors- These detectors need to be installed on the appropriate level of your home.  Replace batteries at least once a year.  Skin cancer- When out in the sun, cover up and use sunscreen 15 SPF or higher.  Violence- If anyone is threatening you, please tell your healthcare provider.  Living Will/ Health care power of attorney- Speak with your healthcare provider and family.  Nosebleed A nosebleed is when blood comes out of the nose. Nosebleeds are common. They are usually not a sign of a serious medical condition. Follow these instructions at home:  Try controlling your nosebleed by pinching your nostrils gently. Do this for at least 10 minutes.  Avoid blowing or sniffing your nose for a number of hours after having a nosebleed.  Do not put gauze inside of your nose yourself. If your nose was packed by your doctor, try to keep the pack inside of your nose until your doctor removes it.  If a gauze pack was used and it starts to fall out, gently replace it or cut off the end of it.  If a balloon catheter was used to pack your nose, do not cut or remove it unless told by your doctor.  Avoid lying down while you are having a nosebleed. Sit up and lean forward.  Use a nasal spray decongestant to help with a nosebleed as told by your doctor.  Do not use petroleum jelly or mineral oil in your nose. These can drip into your lungs.  Keep your  house humid by using:  Less air conditioning.  A humidifier.  Aspirin and blood thinners make bleeding more likely. If you are prescribed these medicines and you have nosebleeds, ask your doctor if you should  stop taking the medicines or adjust the dose. Do not stop medicines unless told by your doctor.  Resume your normal activities as you are able. Avoid straining, lifting, or bending at your waist for several days.  If your nosebleed was caused by dryness in your nose, use over-the-counter saline nasal spray or gel. If you must use a lubricant:  Choose one that is water-soluble.  Use it only as needed.  Do not use it within several hours of lying down.  Keep all follow-up visits as told by your doctor. This is important. Contact a health care provider if:  You have a fever.  You get frequent nosebleeds.  You are getting nosebleeds more often. Get help right away if:  Your nosebleed lasts longer than 20 minutes.  Your nosebleed occurs after an injury to your face, and your nose looks crooked or broken.  You have unusual bleeding from other parts of your body.  You have unusual bruising on other parts of your body.  You feel light-headed or dizzy.  You become sweaty.  You throw up (vomit) blood.  You have a nosebleed after a head injury. This information is not intended to replace advice given to you by your health care provider. Make sure you discuss any questions you have with your health care provider. Document Released: 08/08/2008 Document Revised: 06/02/2016 Document Reviewed: 06/15/2014 Elsevier Interactive Patient Education  2017 [employer].     IF you received an x-ray today, you will receive an invoice from [facility]. Please contact [facility] at [phone] with questions or concerns regarding your invoice.   IF you received labwork today, you will receive an invoice from [facility]. Please contact [facility] at [phone] with questions or concerns regarding your invoice.   Our billing staff will not be able to assist you with questions regarding bills from these companies.  You will be contacted with the lab results as soon as they are  available. The fastest way to get your results is to activate your My Chart account. Instructions are located on the last page of this paperwork. If you have not heard from [place] regarding the results in 2 weeks, please contact this office.       I personally performed the services described in this documentation, which was scribed in my presence. The recorded information has been reviewed and considered for accuracy and completeness, addended by me as needed, and agree with information above.  Signed,   Merri Ray, MD [facility] [place].  02/11/17 1:48 PM

## 2017-02-08 NOTE — Patient Instructions (Addendum)
For nosebleeds - try humidifier in room where you sleep until heat in house is not being used,  saline nasal spray few times per day and in next week start flonase nasal spray 1 spray per nostril each day initially. If nosebleeds not improving in next few weeks, let me know and I can refer you to Ear Nose and Throat.    Continue follow up with dermatology, urology and hematologist as planned.   Try to limit alcohol to no more than 2 drinks per day for general health and for gout.   No change in gout meds for now, continue the colchicine every other day. Depending on level of uric acid, may be able to follow-up next year for repeat testing (unless you do have a repeat flare of gout, then follow-up sooner).    Keeping you healthy  Get these tests  Blood pressure- Have your blood pressure checked once a year by your healthcare provider.  Normal blood pressure is 120/80  Weight- Have your body mass index (BMI) calculated to screen for obesity.  BMI is a measure of body fat based on height and weight. You can also calculate your own BMI at ViewBanking.si.  Cholesterol- Have your cholesterol checked every year.  Diabetes- Have your blood sugar checked regularly if you have high blood pressure, high cholesterol, have a family history of diabetes or if you are overweight.  Screening for Colon Cancer- Colonoscopy starting at age 89.  Screening may begin sooner depending on your family history and other health conditions. Follow up colonoscopy as directed by your Gastroenterologist.  Screening for Prostate Cancer- Both blood work (PSA) and a rectal exam help screen for Prostate Cancer.  Screening begins at age 19 with African-American men and at age 72 with Caucasian men.  Screening may begin sooner depending on your family history.  Take these medicines  Aspirin- One aspirin daily can help prevent Heart disease and Stroke.  Flu shot- Every fall.  Tetanus- Every 10 years.  Zostavax-  Once after the age of 84 to prevent Shingles.  Pneumonia shot- Once after the age of 37; if you are younger than 74, ask your healthcare provider if you need a Pneumonia shot.  Take these steps  Don't smoke- If you do smoke, talk to your doctor about quitting.  For tips on how to quit, go to www.smokefree.gov or call 1-800-QUIT-NOW.  Be physically active- Exercise 5 days a week for at least 30 minutes.  If you are not already physically active start slow and gradually work up to 30 minutes of moderate physical activity.  Examples of moderate activity include walking briskly, mowing the yard, dancing, swimming, bicycling, etc.  Eat a healthy diet- Eat a variety of healthy food such as fruits, vegetables, low fat milk, low fat cheese, yogurt, lean meant, poultry, fish, beans, tofu, etc. For more information go to www.thenutritionsource.org  Drink alcohol in moderation- Limit alcohol intake to less than two drinks a day. Never drink and drive.  Dentist- Brush and floss twice daily; visit your dentist twice a year.  Depression- Your emotional health is as important as your physical health. If you're feeling down, or losing interest in things you would normally enjoy please talk to your healthcare provider.  Eye exam- Visit your eye doctor every year.  Safe sex- If you may be exposed to a sexually transmitted infection, use a condom.  Seat belts- Seat belts can save your life; always wear one.  Smoke/Carbon Building services engineer- These detectors need  to be installed on the appropriate level of your home.  Replace batteries at least once a year.  Skin cancer- When out in the sun, cover up and use sunscreen 15 SPF or higher.  Violence- If anyone is threatening you, please tell your healthcare provider.  Living Will/ Health care power of attorney- Speak with your healthcare provider and family.  Nosebleed A nosebleed is when blood comes out of the nose. Nosebleeds are common. They are usually  not a sign of a serious medical condition. Follow these instructions at home:  Try controlling your nosebleed by pinching your nostrils gently. Do this for at least 10 minutes.  Avoid blowing or sniffing your nose for a number of hours after having a nosebleed.  Do not put gauze inside of your nose yourself. If your nose was packed by your doctor, try to keep the pack inside of your nose until your doctor removes it.  If a gauze pack was used and it starts to fall out, gently replace it or cut off the end of it.  If a balloon catheter was used to pack your nose, do not cut or remove it unless told by your doctor.  Avoid lying down while you are having a nosebleed. Sit up and lean forward.  Use a nasal spray decongestant to help with a nosebleed as told by your doctor.  Do not use petroleum jelly or mineral oil in your nose. These can drip into your lungs.  Keep your house humid by using:  Less air conditioning.  A humidifier.  Aspirin and blood thinners make bleeding more likely. If you are prescribed these medicines and you have nosebleeds, ask your doctor if you should stop taking the medicines or adjust the dose. Do not stop medicines unless told by your doctor.  Resume your normal activities as you are able. Avoid straining, lifting, or bending at your waist for several days.  If your nosebleed was caused by dryness in your nose, use over-the-counter saline nasal spray or gel. If you must use a lubricant:  Choose one that is water-soluble.  Use it only as needed.  Do not use it within several hours of lying down.  Keep all follow-up visits as told by your doctor. This is important. Contact a health care provider if:  You have a fever.  You get frequent nosebleeds.  You are getting nosebleeds more often. Get help right away if:  Your nosebleed lasts longer than 20 minutes.  Your nosebleed occurs after an injury to your face, and your nose looks crooked or  broken.  You have unusual bleeding from other parts of your body.  You have unusual bruising on other parts of your body.  You feel light-headed or dizzy.  You become sweaty.  You throw up (vomit) blood.  You have a nosebleed after a head injury. This information is not intended to replace advice given to you by your health care provider. Make sure you discuss any questions you have with your health care provider. Document Released: 08/08/2008 Document Revised: 06/02/2016 Document Reviewed: 06/15/2014 Elsevier Interactive Patient Education  2017 [employer].     IF you received an x-ray today, you will receive an invoice from [facility]. Please contact [facility] at [phone] with questions or concerns regarding your invoice.   IF you received labwork today, you will receive an invoice from [place]. Please contact [facility] at [phone] with questions or concerns regarding your invoice.   Our billing staff will not  be able to assist you with questions regarding bills from these companies.  You will be contacted with the lab results as soon as they are available. The fastest way to get your results is to activate your My Chart account. Instructions are located on the last page of this paperwork. If you have not heard from [place] regarding the results in 2 weeks, please contact this office.

## 2017-02-09 LAB — COMPREHENSIVE METABOLIC PANEL
ALBUMIN: 4.5 g/dL (ref 3.5–5.5)
ALK PHOS: 52 IU/L (ref 39–117)
ALT: 26 IU/L (ref 0–44)
AST: 26 IU/L (ref 0–40)
Albumin/Globulin Ratio: 1.7 (ref 1.2–2.2)
BILIRUBIN TOTAL: 0.5 mg/dL (ref 0.0–1.2)
BUN / CREAT RATIO: 22 — AB (ref 9–20)
BUN: 20 mg/dL (ref 6–24)
CO2: 24 mmol/L (ref 18–29)
Calcium: 9.1 mg/dL (ref 8.7–10.2)
Chloride: 99 mmol/L (ref 96–106)
Creatinine, Ser: 0.92 mg/dL (ref 0.76–1.27)
GFR calc Af Amer: 107 mL/min/{1.73_m2} (ref >59)
GFR calc non Af Amer: 93 mL/min/{1.73_m2} (ref >59)
GLOBULIN, TOTAL: 2.6 g/dL (ref 1.5–4.5)
Glucose: 110 mg/dL — ABNORMAL HIGH (ref 65–99)
Potassium: 4.8 mmol/L (ref 3.5–5.2)
SODIUM: 139 mmol/L (ref 134–144)
TOTAL PROTEIN: 7.1 g/dL (ref 6.0–8.5)

## 2017-02-09 LAB — CBC WITH DIFFERENTIAL/PLATELET
BASOS ABS: 0 10*3/uL (ref 0.0–0.2)
BASOS: 0 %
EOS (ABSOLUTE): 0 10*3/uL (ref 0.0–0.4)
Eos: 1 %
Hematocrit: 42.1 % (ref 37.5–51.0)
Hemoglobin: 14.2 g/dL (ref 13.0–17.7)
Immature Grans (Abs): 0 10*3/uL (ref 0.0–0.1)
Immature Granulocytes: 0 %
LYMPHS ABS: 1.3 10*3/uL (ref 0.7–3.1)
Lymphs: 37 %
MCH: 33.7 pg — AB (ref 26.6–33.0)
MCHC: 33.7 g/dL (ref 31.5–35.7)
MCV: 100 fL — AB (ref 79–97)
MONOS ABS: 0.2 10*3/uL (ref 0.1–0.9)
Monocytes: 7 %
NEUTROS ABS: 1.9 10*3/uL (ref 1.4–7.0)
Neutrophils: 55 %
PLATELETS: 133 10*3/uL — AB (ref 150–379)
RBC: 4.21 x10E6/uL (ref 4.14–5.80)
RDW: 12.9 % (ref 12.3–15.4)
WBC: 3.5 10*3/uL (ref 3.4–10.8)

## 2017-02-09 LAB — LIPID PANEL
CHOLESTEROL TOTAL: 174 mg/dL (ref 100–199)
Chol/HDL Ratio: 4.1 ratio units (ref 0.0–5.0)
HDL: 42 mg/dL (ref >39)
LDL CALC: 78 mg/dL (ref 0–99)
TRIGLYCERIDES: 269 mg/dL — AB (ref 0–149)
VLDL Cholesterol Cal: 54 mg/dL — ABNORMAL HIGH (ref 5–40)

## 2017-02-09 LAB — URIC ACID: Uric Acid: 9 mg/dL — ABNORMAL HIGH (ref 3.7–8.6)

## 2017-02-09 LAB — HIV ANTIBODY (ROUTINE TESTING W REFLEX): HIV SCREEN 4TH GENERATION: NONREACTIVE

## 2017-03-30 ENCOUNTER — Ambulatory Visit (HOSPITAL_BASED_OUTPATIENT_CLINIC_OR_DEPARTMENT_OTHER): Payer: Commercial Managed Care - HMO | Admitting: Family

## 2017-03-30 ENCOUNTER — Other Ambulatory Visit (HOSPITAL_BASED_OUTPATIENT_CLINIC_OR_DEPARTMENT_OTHER): Payer: Commercial Managed Care - HMO

## 2017-03-30 VITALS — BP 126/82 | HR 60 | Temp 98.2°F | Resp 18 | Wt 201.4 lb

## 2017-03-30 DIAGNOSIS — K7581 Nonalcoholic steatohepatitis (NASH): Secondary | ICD-10-CM

## 2017-03-30 DIAGNOSIS — D696 Thrombocytopenia, unspecified: Secondary | ICD-10-CM

## 2017-03-30 LAB — CBC WITH DIFFERENTIAL (CANCER CENTER ONLY)
BASO#: 0 10*3/uL (ref 0.0–0.2)
BASO%: 0.4 % (ref 0.0–2.0)
EOS ABS: 0.1 10*3/uL (ref 0.0–0.5)
EOS%: 2 % (ref 0.0–7.0)
HCT: 40.9 % (ref 38.7–49.9)
HEMOGLOBIN: 14.2 g/dL (ref 13.0–17.1)
LYMPH#: 1.4 10*3/uL (ref 0.9–3.3)
LYMPH%: 31.8 % (ref 14.0–48.0)
MCH: 34.6 pg — AB (ref 28.0–33.4)
MCHC: 34.7 g/dL (ref 32.0–35.9)
MCV: 100 fL — ABNORMAL HIGH (ref 82–98)
MONO#: 0.5 10*3/uL (ref 0.1–0.9)
MONO%: 11.2 % (ref 0.0–13.0)
NEUT%: 54.6 % (ref 40.0–80.0)
NEUTROS ABS: 2.4 10*3/uL (ref 1.5–6.5)
Platelets: 118 10*3/uL — ABNORMAL LOW (ref 145–400)
RBC: 4.1 10*6/uL — AB (ref 4.20–5.70)
RDW: 11.8 % (ref 11.1–15.7)
WBC: 4.5 10*3/uL (ref 4.0–10.0)

## 2017-03-30 NOTE — Progress Notes (Signed)
Hematology and Oncology Follow Up Visit  Brendan Short [id number] Mar 16, 1961 56 y.o. 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 He is doing well and staying quite busy with work. He has no complaints at this time. He did go to [place] and have a few beers last week and was concerned about what his platelet counts would be today. His counts are stable. Hgb is 14.2 with a platelet count of 118.  He has had no bruising, bleeding or petechiae. No lymphadenopathy found on exam.  He has had no issue with frequent infections. No fever, chills, n/v, cough, rash, dizziness, SOB, chest pain, palpitations, abdominal pain or changes in bowel or bladder habits.  No swelling, tenderness, numbness or tingling in his extremities.  He has maintained a good appetite and is staying well hydrated. His weight is stable.  He has occasional flares with gout and takes colchicine and benemid every other day.   ECOG Performance Status: 0 - Asymptomatic  Medications:  Allergies as of 03/30/2017      Reactions   Allopurinol       Medication List       Accurate as of 03/30/17  8:44 AM. Always use your most recent med list.          cholecalciferol 1000 units tablet Commonly known as:  VITAMIN D Take 1,000 Units by mouth daily.   colchicine 0.6 MG tablet Commonly known as:  COLCRYS TAKE 1 TABLET BY MOUTH EVERY other day   diclofenac sodium 1 % Gel Commonly known as:  VOLTAREN Apply 1 application topically 2 (two) times daily.   fluticasone 50 MCG/ACT nasal spray Commonly known as:  FLONASE Place 1-2 sprays into both nostrils daily.   probenecid 500 MG tablet Commonly known as:  BENEMID Take 1 tablet (500 mg total) by mouth daily.       Allergies:  Allergies  Allergen Reactions  . Allopurinol     Past Medical History, Surgical history, Social history, and  Family History were reviewed and updated.  Review of Systems: All other 10 point review of systems is negative.   Physical Exam:  weight is 201 lb 6.4 oz (91.4 kg). His oral temperature is 98.2 F (36.8 C). His blood pressure is 126/82 and his pulse is 60. His respiration is 18 and oxygen saturation is 100%.   Wt Readings from Last 3 Encounters:  03/30/17 201 lb 6.4 oz (91.4 kg)  02/08/17 203 lb (92.1 kg)  09/29/16 201 lb 12.8 oz (91.5 kg)    Ocular: Sclerae unicteric, pupils equal, round and reactive to light Ear-nose-throat: Oropharynx clear, dentition fair Lymphatic: No cervical, supraclavicular or axillary adenopathy Lungs no rales or rhonchi, good excursion bilaterally Heart regular rate and rhythm, no murmur appreciated Abd soft, nontender, positive bowel sounds, no liver or spleen tip palpated on exam,  MSK no focal spinal tenderness, no joint edema Neuro: non-focal, well-oriented, appropriate affect Breasts: Deferred  Lab Results  Component Value Date   WBC 4.5 03/30/2017   HGB 14.2 03/30/2017   HCT 40.9 03/30/2017   MCV 100 (H) 03/30/2017   PLT 118 Platelet count consistent in citrate (L) 03/30/2017   No results found for: FERRITIN, IRON, TIBC, UIBC, IRONPCTSAT Lab Results  Component Value Date   RBC 4.10 (L) 03/30/2017   No results found for: KPAFRELGTCHN, LAMBDASER, KAPLAMBRATIO No results found for: IGGSERUM, IGA, IGMSERUM  No results found for: Ronnald Ramp, A1GS, A2GS, Tillman Sers, SPEI   Chemistry      Component Value Date/Time   NA 139 02/08/2017 1552   K 4.8 02/08/2017 1552   CL 99 02/08/2017 1552   CO2 24 02/08/2017 1552   BUN 20 02/08/2017 1552   CREATININE 0.92 02/08/2017 1552   CREATININE 0.97 02/03/2016 1446      Component Value Date/Time   CALCIUM 9.1 02/08/2017 1552   ALKPHOS 52 02/08/2017 1552   AST 26 02/08/2017 1552   ALT 26 02/08/2017 1552   BILITOT 0.5 02/08/2017 1552      Impression and Plan: Mr.  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 He is doing well and his platelet count is stable at 113. No anemia. He is asymptomatic at this time.  He will continue to avoid alcoholic beverages.  We will plan to see him back in another 6 months for repeat lab work and follow-up.  He will contact our office with any questions or concerns. We can certainly see him sooner if need be.   Eliezer Bottom, NP 5/18/20188:44 AM

## 2017-08-20 DIAGNOSIS — N400 Enlarged prostate without lower urinary tract symptoms: Secondary | ICD-10-CM | POA: Diagnosis not present

## 2017-08-27 DIAGNOSIS — N400 Enlarged prostate without lower urinary tract symptoms: Secondary | ICD-10-CM | POA: Diagnosis not present

## 2017-08-27 DIAGNOSIS — R972 Elevated prostate specific antigen [PSA]: Secondary | ICD-10-CM | POA: Diagnosis not present

## 2017-10-12 ENCOUNTER — Ambulatory Visit (HOSPITAL_BASED_OUTPATIENT_CLINIC_OR_DEPARTMENT_OTHER): Payer: 59 | Admitting: Hematology & Oncology

## 2017-10-12 ENCOUNTER — Other Ambulatory Visit: Payer: Self-pay

## 2017-10-12 ENCOUNTER — Encounter: Payer: Self-pay | Admitting: Hematology & Oncology

## 2017-10-12 ENCOUNTER — Other Ambulatory Visit (HOSPITAL_BASED_OUTPATIENT_CLINIC_OR_DEPARTMENT_OTHER): Payer: 59

## 2017-10-12 VITALS — BP 128/79 | HR 61 | Temp 98.2°F | Resp 16 | Wt 206.0 lb

## 2017-10-12 DIAGNOSIS — D696 Thrombocytopenia, unspecified: Secondary | ICD-10-CM | POA: Diagnosis not present

## 2017-10-12 DIAGNOSIS — K7581 Nonalcoholic steatohepatitis (NASH): Secondary | ICD-10-CM | POA: Diagnosis not present

## 2017-10-12 LAB — CBC WITH DIFFERENTIAL (CANCER CENTER ONLY)
BASO#: 0 10*3/uL (ref 0.0–0.2)
BASO%: 0.5 % (ref 0.0–2.0)
EOS%: 1.5 % (ref 0.0–7.0)
Eosinophils Absolute: 0.1 10*3/uL (ref 0.0–0.5)
HCT: 41.5 % (ref 38.7–49.9)
HGB: 14.7 g/dL (ref 13.0–17.1)
LYMPH#: 1.2 10*3/uL (ref 0.9–3.3)
LYMPH%: 31.2 % (ref 14.0–48.0)
MCH: 34.8 pg — ABNORMAL HIGH (ref 28.0–33.4)
MCHC: 35.4 g/dL (ref 32.0–35.9)
MCV: 98 fL (ref 82–98)
MONO#: 0.4 10*3/uL (ref 0.1–0.9)
MONO%: 10.1 % (ref 0.0–13.0)
NEUT#: 2.3 10*3/uL (ref 1.5–6.5)
NEUT%: 56.7 % (ref 40.0–80.0)
RBC: 4.22 10*6/uL (ref 4.20–5.70)
RDW: 11.8 % (ref 11.1–15.7)
WBC: 4 10*3/uL (ref 4.0–10.0)

## 2017-10-12 NOTE — Progress Notes (Signed)
Hematology and Oncology Follow Up Visit  Brendan Short [id number] 1960-12-28 56 y.o. 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  He is doing quite well.  He is still working for the [employer].  He is down at the [place].  He tells me all about what is going on down there.  Had a nice Thanksgiving.  He has had no problems with bleeding or bruising.  He has had no problems with rashes.  He is watching his alcohol intake.  He is not drinking that many beers this time of year.  He does have some wine on occasion.  He has had no cough.  There is no shortness of breath.  He has had no change in bowel or bladder habits.  ECOG Performance Status: 0 - Asymptomatic  Medications:  Allergies as of 10/12/2017      Reactions   Allopurinol       Medication List        Accurate as of 10/12/17  8:16 AM. Always use your most recent med list.          cholecalciferol 1000 units tablet Commonly known as:  VITAMIN D Take 1,000 Units by mouth daily.   colchicine 0.6 MG tablet Commonly known as:  COLCRYS TAKE 1 TABLET BY MOUTH EVERY other day   diclofenac sodium 1 % Gel Commonly known as:  VOLTAREN Apply 1 application topically 2 (two) times daily.   fluticasone 50 MCG/ACT nasal spray Commonly known as:  FLONASE Place 1-2 sprays into both nostrils daily.   probenecid 500 MG tablet Commonly known as:  BENEMID Take 1 tablet (500 mg total) by mouth daily.       Allergies:  Allergies  Allergen Reactions  . Allopurinol     Past Medical History, Surgical history, Social history, and Family History were reviewed and updated.  Review of Systems: As stated in the interim history  Physical Exam:  weight is 206 lb (93.4 kg). His oral temperature is 98.2 F (36.8 C). His blood pressure is 128/79 and his pulse is 61. His  respiration is 16 and oxygen saturation is 98%.   Wt Readings from Last 3 Encounters:  10/12/17 206 lb (93.4 kg)  03/30/17 201 lb 6.4 oz (91.4 kg)  02/08/17 203 lb (92.1 kg)    Physical Exam  Constitutional: He is oriented to person, place, and time.  HENT:  Head: Normocephalic and atraumatic.  Mouth/Throat: Oropharynx is clear and moist.  Eyes: EOM are normal. Pupils are equal, round, and reactive to light.  Neck: Normal range of motion.  Cardiovascular: Normal rate, regular rhythm and normal heart sounds.  Pulmonary/Chest: Effort normal and breath sounds normal.  Abdominal: Soft. Bowel sounds are normal.  Musculoskeletal: Normal range of motion. He exhibits no edema, tenderness or deformity.  Lymphadenopathy:    He has no cervical adenopathy.  Neurological: He is alert and oriented to person, place, and time.  Skin: Skin is warm and dry. No rash noted. No erythema.  Psychiatric: He has a normal mood and affect. His behavior is normal. Judgment and thought content normal.  Vitals reviewed.    Lab Results  Component Value Date   WBC 4.0 10/12/2017   HGB 14.7 10/12/2017   HCT 41.5 10/12/2017   MCV 98 10/12/2017   PLT 115 Platelet count consistent in citrate (L) 10/12/2017   No  results found for: FERRITIN, IRON, TIBC, UIBC, IRONPCTSAT Lab Results  Component Value Date   RBC 4.22 10/12/2017   No results found for: KPAFRELGTCHN, LAMBDASER, KAPLAMBRATIO No results found for: IGGSERUM, IGA, IGMSERUM No results found for: Ronnald Ramp, A1GS, Nelida Meuse, SPEI   Chemistry      Component Value Date/Time   NA 139 02/08/2017 1552   K 4.8 02/08/2017 1552   CL 99 02/08/2017 1552   CO2 24 02/08/2017 1552   BUN 20 02/08/2017 1552   CREATININE 0.92 02/08/2017 1552   CREATININE 0.97 02/03/2016 1446      Component Value Date/Time   CALCIUM 9.1 02/08/2017 1552   ALKPHOS 52 02/08/2017 1552   AST 26 02/08/2017 1552   ALT 26 02/08/2017 1552    BILITOT 0.5 02/08/2017 1552      Impression and Plan: Brendan Short is a very pleasant 56 yo caucasian male with history of thombocytopenia secondary to NASH.  Overall, his platelet count is holding pretty steady.  Really, in one year, his platelet count has not changed all that much.  I still think that his platelet count will be related to his alcohol intake.  There is no need for any studies right now.  I do not think he needs an ultrasound to check his spleen.  I do not think he needs a bone marrow test.  We will plan to get him back in another 6 months.     Volanda Napoleon, MD 11/30/20188:16 AM

## 2017-10-19 DIAGNOSIS — N400 Enlarged prostate without lower urinary tract symptoms: Secondary | ICD-10-CM | POA: Diagnosis not present

## 2018-01-17 ENCOUNTER — Other Ambulatory Visit: Payer: Self-pay | Admitting: Family Medicine

## 2018-01-17 DIAGNOSIS — M109 Gout, unspecified: Secondary | ICD-10-CM

## 2018-01-17 DIAGNOSIS — Z8739 Personal history of other diseases of the musculoskeletal system and connective tissue: Secondary | ICD-10-CM

## 2018-01-24 DIAGNOSIS — L570 Actinic keratosis: Secondary | ICD-10-CM | POA: Diagnosis not present

## 2018-01-24 DIAGNOSIS — D225 Melanocytic nevi of trunk: Secondary | ICD-10-CM | POA: Diagnosis not present

## 2018-01-24 DIAGNOSIS — D229 Melanocytic nevi, unspecified: Secondary | ICD-10-CM | POA: Diagnosis not present

## 2018-01-26 ENCOUNTER — Other Ambulatory Visit: Payer: Self-pay | Admitting: Family Medicine

## 2018-01-26 DIAGNOSIS — M109 Gout, unspecified: Secondary | ICD-10-CM

## 2018-01-26 DIAGNOSIS — Z8739 Personal history of other diseases of the musculoskeletal system and connective tissue: Secondary | ICD-10-CM

## 2018-02-07 DIAGNOSIS — B078 Other viral warts: Secondary | ICD-10-CM | POA: Diagnosis not present

## 2018-02-07 DIAGNOSIS — L570 Actinic keratosis: Secondary | ICD-10-CM | POA: Diagnosis not present

## 2018-02-11 ENCOUNTER — Encounter: Payer: Commercial Managed Care - HMO | Admitting: Family Medicine

## 2018-02-13 ENCOUNTER — Other Ambulatory Visit: Payer: Self-pay | Admitting: Family Medicine

## 2018-02-13 DIAGNOSIS — M109 Gout, unspecified: Secondary | ICD-10-CM

## 2018-02-13 DIAGNOSIS — Z8739 Personal history of other diseases of the musculoskeletal system and connective tissue: Secondary | ICD-10-CM

## 2018-03-01 ENCOUNTER — Encounter: Payer: Self-pay | Admitting: Family Medicine

## 2018-03-11 ENCOUNTER — Encounter: Payer: Self-pay | Admitting: Family Medicine

## 2018-03-11 ENCOUNTER — Ambulatory Visit (INDEPENDENT_AMBULATORY_CARE_PROVIDER_SITE_OTHER): Payer: 59 | Admitting: Family Medicine

## 2018-03-11 ENCOUNTER — Other Ambulatory Visit: Payer: Self-pay

## 2018-03-11 VITALS — BP 122/66 | HR 56 | Temp 97.3°F | Ht 75.0 in | Wt 209.2 lb

## 2018-03-11 DIAGNOSIS — S39012A Strain of muscle, fascia and tendon of lower back, initial encounter: Secondary | ICD-10-CM | POA: Diagnosis not present

## 2018-03-11 DIAGNOSIS — Z0000 Encounter for general adult medical examination without abnormal findings: Secondary | ICD-10-CM | POA: Diagnosis not present

## 2018-03-11 DIAGNOSIS — D696 Thrombocytopenia, unspecified: Secondary | ICD-10-CM | POA: Diagnosis not present

## 2018-03-11 DIAGNOSIS — Z131 Encounter for screening for diabetes mellitus: Secondary | ICD-10-CM | POA: Diagnosis not present

## 2018-03-11 DIAGNOSIS — M545 Low back pain, unspecified: Secondary | ICD-10-CM

## 2018-03-11 DIAGNOSIS — Z13220 Encounter for screening for lipoid disorders: Secondary | ICD-10-CM

## 2018-03-11 DIAGNOSIS — Z8739 Personal history of other diseases of the musculoskeletal system and connective tissue: Secondary | ICD-10-CM | POA: Diagnosis not present

## 2018-03-11 DIAGNOSIS — M109 Gout, unspecified: Secondary | ICD-10-CM | POA: Diagnosis not present

## 2018-03-11 MED ORDER — PROBENECID 500 MG PO TABS: 500.0000 mg | ORAL_TABLET | Freq: Every day | ORAL | 3 refills | Status: DC

## 2018-03-11 MED ORDER — COLCHICINE 0.6 MG PO TABS: ORAL_TABLET | ORAL | 3 refills | Status: DC

## 2018-03-11 MED ORDER — CYCLOBENZAPRINE HCL 5 MG PO TABS: ORAL_TABLET | ORAL | 0 refills | Status: DC

## 2018-03-11 NOTE — Patient Instructions (Addendum)
Keeping you healthy  Get these tests  Blood pressure- Have your blood pressure checked once a year by your healthcare provider.  Normal blood pressure is 120/80  Weight- Have your body mass index (BMI) calculated to screen for obesity.  BMI is a measure of body fat based on height and weight. You can also calculate your own BMI at ViewBanking.[email or web address].  Cholesterol- Have your cholesterol checked every year.  Diabetes- Have your blood sugar checked regularly if you have high blood pressure, high cholesterol, have a family history of diabetes or if you are overweight.  Screening for Colon Cancer- Colonoscopy starting at age 31.  Screening may begin sooner depending on your family history and other health conditions. Follow up colonoscopy as directed by your Gastroenterologist.  Screening for Prostate Cancer- Both blood work (PSA) and a rectal exam help screen for Prostate Cancer.  Screening begins at age 51 with African-American men and at age 4 with Caucasian men.  Screening may begin sooner depending on your family history.  Take these medicines  Flu shot- Every fall.  Tetanus- Every 10 years.  Zostavax- Once after the age of 44 to prevent Shingles.  Pneumonia shot- Once after the age of 108; if you are younger than 72, ask your healthcare provider if you need a Pneumonia shot.  Take these steps  Don't smoke- If you do smoke, talk to your doctor about quitting.  For tips on how to quit, go to www.smokefree.gov or call 1-800-QUIT-NOW.  Be physically active- Exercise 5 days a week for at least 30 minutes.  If you are not already physically active start slow and gradually work up to 30 minutes of moderate physical activity.  Examples of moderate activity include walking briskly, mowing the yard, dancing, swimming, bicycling, etc.  Eat a healthy diet- Eat a variety of healthy food such as fruits, vegetables, low fat milk, low fat cheese, yogurt, lean meant, poultry, fish, beans,  tofu, etc. For more information go to www.thenutritionsource.org  Drink alcohol in moderation- Limit alcohol intake to less than two drinks a day. Never drink and drive.  Dentist- Brush and floss twice daily; visit your dentist twice a year.  Depression- Your emotional health is as important as your physical health. If you're feeling down, or losing interest in things you would normally enjoy please talk to your healthcare provider.  Eye exam- Visit your eye doctor every year.  Safe sex- If you may be exposed to a sexually transmitted infection, use a condom.  Seat belts- Seat belts can save your life; always wear one.  Smoke/Carbon Monoxide detectors- These detectors need to be installed on the appropriate level of your home.  Replace batteries at least once a year.  Skin cancer- When out in the sun, cover up and use sunscreen 15 SPF or higher.  Violence- If anyone is threatening you, please tell your healthcare provider.  Living Will/ Health care power of attorney- Speak with your healthcare provider and family.   Low Back Strain A strain is a stretch or tear in a muscle or the strong cords of tissue that attach muscle to bone (tendons). Strains of the lower back (lumbar spine) are a common cause of low back pain. A strain occurs when muscles or tendons are torn or are stretched beyond their limits. The muscles may become inflamed, resulting in pain and sudden muscle tightening (spasms). A strain can happen suddenly due to an injury (trauma), or it can develop gradually due to overuse. There are  three types of strains:  Grade 1 is a mild strain involving a minor tear of the muscle fibers or tendons. This may cause some pain but no loss of muscle strength.  Grade 2 is a moderate strain involving a partial tear of the muscle fibers or tendons. This causes more severe pain and some loss of muscle strength.  Grade 3 is a severe strain involving a complete tear of the muscle or tendon.  This causes severe pain and complete or nearly complete loss of muscle strength.  What are the causes? This condition may be caused by:  Trauma, such as a fall or a hit to the body.  Twisting or overstretching the back. This may result from doing activities that require a lot of energy, such as lifting heavy objects.  What increases the risk? The following factors may increase your risk of getting this condition:  Playing contact sports.  Participating in sports or activities that put excessive stress on the back and require a lot of bending and twisting, including: ? Lifting weights or heavy objects. ? Gymnastics. ? Soccer. ? Figure skating. ? Snowboarding.  Being overweight or obese.  Having poor strength and flexibility.  What are the signs or symptoms? Symptoms of this condition may include:  Sharp or dull pain in the lower back that does not go away. Pain may extend to the buttocks.  Stiffness.  Limited range of motion.  Inability to stand up straight due to stiffness or pain.  Muscle spasms.  How is this diagnosed? This condition may be diagnosed based on:  Your symptoms.  Your medical history.  A physical exam. ? Your health care provider may push on certain areas of your back to determine the source of your pain. ? You may be asked to bend forward, backward, and side to side to assess the severity of your pain and your range of motion.  Imaging tests, such as: ? X-rays. ? MRI.  How is this treated? Treatment for this condition may include:  Applying heat and cold to the affected area.  Medicines to help relieve pain and to relax your muscles (muscle relaxants).  NSAIDs to help reduce swelling and discomfort.  Physical therapy.  When your symptoms improve, it is important to gradually return to your normal routine as soon as possible to reduce pain, avoid stiffness, and avoid loss of muscle strength. Generally, symptoms should improve within 6  weeks of treatment. However, recovery time varies. Follow these instructions at home: Managing pain, stiffness, and swelling  If directed, apply ice to the injured area during the first 24 hours after your injury. ? Put ice in a plastic bag. ? Place a towel between your skin and the bag. ? Leave the ice on for 20 minutes, 2-3 times a day.  If directed, apply heat to the affected area as often as told by your health care provider. Use the heat source that your health care provider recommends, such as a moist heat pack or a heating pad. ? Place a towel between your skin and the heat source. ? Leave the heat on for 20-30 minutes. ? Remove the heat if your skin turns bright red. This is especially important if you are unable to feel pain, heat, or cold. You may have a greater risk of getting burned. Activity  Rest and return to your normal activities as told by your health care provider. Ask your health care provider what activities are safe for you.  Avoid activities that  take a lot of effort (are strenuous) for as long as told by your health care provider.  Do exercises as told by your health care provider. General instructions   Take over-the-counter and prescription medicines only as told by your health care provider.  If you have questions or concerns about safety while taking pain medicine, talk with your health care provider.  Do not drive or operate heavy machinery until you know how your pain medicine affects you.  Do not use any tobacco products, such as cigarettes, chewing tobacco, and e-cigarettes. Tobacco can delay bone healing. If you need help quitting, ask your health care provider.  Keep all follow-up visits as told by your health care provider. This is important. How is this prevented?  Warm up and stretch before being active.  Cool down and stretch after being active.  Give your body time to rest between periods of activity.  Avoid: ? Being physically inactive  for long periods at a time. ? Exercising or playing sports when you are tired or in pain.  Use correct form when playing sports and lifting heavy objects.  Use good posture when sitting and standing.  Maintain a healthy weight.  Sleep on a mattress with medium firmness to support your back.  Make sure to use equipment that fits you, including shoes that fit well.  Be safe and responsible while being active to avoid falls.  Do at least 150 minutes of moderate-intensity exercise each week, such as brisk walking or water aerobics. Try a form of exercise that takes stress off your back, such as swimming or stationary cycling.  Maintain physical fitness, including: ? Strength. ? Flexibility. ? Cardiovascular fitness. ? Endurance. Contact a health care provider if:  Your back pain does not improve after 6 weeks of treatment.  Your symptoms get worse. Get help right away if:  Your back pain is severe.  You are unable to stand or walk.  You develop pain in your legs.  You develop weakness in your buttocks or legs.  You have difficulty controlling when you urinate or when you have a bowel movement. This information is not intended to replace advice given to you by your health care provider. Make sure you discuss any questions you have with your health care provider. Document Released: 10/30/2005 Document Revised: 07/06/2016 Document Reviewed: 08/11/2015 Elsevier Interactive Patient Education  2017 [employer].   IF you received an x-ray today, you will receive an invoice from [facility]. Please contact [facility] at [phone] with questions or concerns regarding your invoice.   IF you received labwork today, you will receive an invoice from [facility]. Please contact [facility] at [phone] with questions or concerns regarding your invoice.   Our billing staff will not be able to assist you with questions regarding bills from these companies.  You will  be contacted with the lab results as soon as they are available. The fastest way to get your results is to activate your My Chart account. Instructions are located on the last page of this paperwork. If you have not heard from [place] regarding the results in 2 weeks, please contact this office.

## 2018-03-11 NOTE — Progress Notes (Signed)
Subjective:  By signing my name below, I, Essence Howell, attest that this documentation has been prepared under the direction and in the presence of Wendie Agreste, MD Electronically Signed: Ladene Artist, ED Scribe 03/11/2018 at 8:22 AM.   Patient ID: Brendan Short, male    DOB: 1961/02/07, 57 y.o.   MRN: [id number]  Chief Complaint  Patient presents with  . Annual Exam    CPE and also back pain/spasms   HPI Brendan Short is a 57 y.o. male who presents to Primary Care at [facility] for an annual exam. H/o gout, thrombocytopenia, elevated PSA. Pt is fasting at this visit.  Back Pain Pt reports R-sided back pain onset 2 days ago while twisting his back. Pt reports similar back pain 2 wks ago while bending to tie his shoe but states pain only lasted for 1-2 days at that time and improved with OTC lidocaine patch. Pt has tried flexeril, ibuprofen, Icy hot, cold compress without relief. Denies weakness in LE, saddle anesthesia, bladder/bowel incontinence, night sweats, unexplained weight loss. He had a XR of lumbar spine in Nov 2016 that showed mild degenerative changes.  Gout Probenecid 500 mg qd and colchicine every other day. - Pt reports only 1 flare within 6 months which was ~1 month ago.  Thrombocytopenia  Endocrinologist Dr. Marin Olp, last visit nov 2018. Thought to be secondary to nash. Thought to have some component of alcohol intact. Labs overall stable, plan for recheck in 6 months. No studies at that time. - Pt states that he has 10-12 beers/wk but has stopped drinking liquor after visit with Dr. Marin Olp. Lab Results  Component Value Date   WBC 4.0 10/12/2017   HGB 14.7 10/12/2017   HCT 41.5 10/12/2017   MCV 98 10/12/2017   PLT 115 Platelet count consistent in citrate (L) 10/12/2017   H/o Elevated PSA Followed by Dr. [name]. OV Oct 15. Slight increased PSA at that time. Thought to have BPH. PSA was 3.61 on Oct 8 up from 2.26 in Oct 2017. Plan for recheck in 6 wks.  CA  Screening Colonoscopy: 07/02/14 Prostate CA Screening: as above.  Lab Results  Component Value Date   PSA 3.17 02/10/2014   PSA 1.99 06/11/2012  Skin CA Screening: Has seen dermatology in the past   Immunizations Immunization History  Administered Date(s) Administered  . Influenza-Unspecified 10/14/2015  . Tdap 02/01/2010  . Zoster 11/13/2014   Depression Screening Depression screen [facility] 2/9 02/08/2017 02/03/2016 09/29/2015 01/26/2015 02/10/2014  Decreased Interest 0 0 0 0 0  Down, Depressed, Hopeless 0 0 0 0 0  PHQ - 2 Score 0 0 0 0 0     Visual Acuity Screening   Right eye Left eye Both eyes  Without correction: 20/20 20/25 20/20   With correction:      Vision: saw Dr. Schuyler Amor in Summer 2017, f/u this summer Dentist: last seen 3 wks ago Exercise: physical job, walks, plays golf and tennis  Lipid Screening Lab Results  Component Value Date   CHOL 174 02/08/2017   HDL 42 02/08/2017   LDLCALC 78 02/08/2017   TRIG 269 (H) 02/08/2017   CHOLHDL 4.1 02/08/2017    Patient Active Problem List   Diagnosis Date Noted  . 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 Thrombocytopenia ([place]) 03/14/2016  . Elevated PSA 03/20/2014  . Gout 06/11/2012   Past Medical History:  Diagnosis Date  . Allergy   . 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 Heart murmur   . Thrombocytopenia (  [place]) 03/14/2016   Past Surgical History:  Procedure Laterality Date  . broken nose  1970  . broken nose surgery, in the early 70"s    . HERNIA REPAIR     Allergies  Allergen Reactions  . Allopurinol    Prior to Admission medications   Medication Sig Start Date End Date Taking? Authorizing Provider  cholecalciferol (VITAMIN D) 1000 UNITS tablet Take 1,000 Units by mouth daily.    [provider]  colchicine (COLCRYS) 0.6 MG tablet TAKE 1 TABLET BY MOUTH EVERY OTHER DAY 01/28/18   Wendie Agreste, MD  diclofenac sodium (VOLTAREN) 1 % GEL Apply 1 application topically 2 (two) times daily.  02/03/16   [name], MD  fluticasone (FLONASE) 50 MCG/ACT nasal spray Place 1-2 sprays into both nostrils daily. 02/08/17   Wendie Agreste, MD  probenecid (BENEMID) 500 MG tablet TAKE 1 TABLET BY MOUTH EVERY DAY 02/13/18   Wendie Agreste, MD   Social History   Socioeconomic History  . Marital status: Married    Spouse name: Not on file  . Number of children: Not on file  . Years of education: Not on file  . Highest education level: Not on file  Occupational History  . Occupation: Landscaper  Social Needs  . Financial resource strain: Not on file  . Food insecurity:    Worry: Not on file    Inability: Not on file  . Transportation needs:    Medical: Not on file    Non-medical: Not on file  Tobacco Use  . Smoking status: Never Smoker  . Smokeless tobacco: Never Used  Substance and Sexual Activity  . Alcohol use: Yes    Alcohol/week: 0.0 oz    Comment: few beers  . Drug use: No  . Sexual activity: Yes  Lifestyle  . Physical activity:    Days per week: Not on file    Minutes per session: Not on file  . Stress: Not on file  Relationships  . Social connections:    Talks on phone: Not on file    Gets together: Not on file    Attends religious service: Not on file    Active member of club or organization: Not on file    Attends meetings of clubs or organizations: Not on file    Relationship status: Not on file  . Intimate partner violence:    Fear of current or ex partner: Not on file    Emotionally abused: Not on file    Physically abused: Not on file    Forced sexual activity: Not on file  Other Topics Concern  . Not on file  Social History Narrative   Married   Education: College   Exercise: Yes   Review of Systems  Constitutional: Negative for diaphoresis and unexpected weight change.  Genitourinary: Negative for enuresis.  Musculoskeletal: Positive for back pain.  Neurological: Negative for weakness and numbness.  All other systems reviewed and are  negative.     Objective:   Physical Exam  Constitutional: He is oriented to person, place, and time. He appears well-developed and well-nourished.  HENT:  Head: Normocephalic and atraumatic.  Right Ear: External ear normal.  Left Ear: External ear normal.  Mouth/Throat: Oropharynx is clear and moist.  Eyes: Pupils are equal, round, and reactive to light. Conjunctivae and EOM are normal.  Neck: Normal range of motion. Neck supple. No thyromegaly present.  Cardiovascular: Normal rate, regular rhythm, normal heart sounds and intact distal  pulses.  Pulmonary/Chest: Effort normal and breath sounds normal. No respiratory distress. He has no wheezes.  Abdominal: Soft. He exhibits no distension. There is no tenderness.  Musculoskeletal: Normal range of motion. He exhibits no edema or tenderness.  No midline bony tenderness. Slight discomfort R paraspinal muscles only. Flexion slow but to 90 degrees. Pain with R lateral flexion. Somewhat guarded L lateral flexion. Rotation intact. Able to heel and toe walk without difficulty. Neg seated straight leg raise with pain in back only, no leg radiation.  Lymphadenopathy:    He has no cervical adenopathy.  Neurological: He is alert and oriented to person, place, and time. He has normal reflexes.  Skin: Skin is warm and dry.  Psychiatric: He has a normal mood and affect. His behavior is normal.  Vitals reviewed.  Vitals:   03/11/18 0816  BP: 122/66  Pulse: (!) 56  Temp: (!) 97.3 F (36.3 C)  TempSrc: Oral  SpO2: 100%  Weight: 209 lb 3.2 oz (94.9 kg)  Height: 6' 3"  (1.905 m)      Assessment & Plan:   Brendan Short is a 57 y.o. male Annual physical exam  - -anticipatory guidance as below in AVS, screening labs above. Health maintenance items as above in HPI discussed/recommended as applicable.   Hx of gout - Plan: probenecid (BENEMID) 500 MG tablet, colchicine (COLCRYS) 0.6 MG tablet Gout, unspecified cause, unspecified chronicity,  unspecified site - Plan: probenecid (BENEMID) 500 MG tablet, colchicine (COLCRYS) 0.6 MG tablet, Uric Acid  -1 recent flare, otherwise has been well controlled on current regimen.  Continue Colcrys every other day, probenecid daily, continue to decrease alcohol intake.  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  Stable readings based on hematology notes.  Continue routine follow-up with hematology.  Acute right-sided low back pain without sciatica - Plan: cyclobenzaprine (FLEXERIL) 5 MG tablet Low back strain, initial encounter - Plan: cyclobenzaprine (FLEXERIL) 5 MG tablet  -Right lumbar strain, no midline bony tenderness, no red flags on exam or history.  Imaging deferred at present.  Continue over-the-counter topical treatments, TENS unit as needed, ibuprofen over-the-counter temporarily, and prescription given for Flexeril every 8 hours as needed.  Potential side effects and risks discussed, RTC precautions if not improving next few days.  Out of work note for the next 2 days.  Screening for diabetes mellitus - Plan: Comprehensive metabolic panel  Screening for hyperlipidemia - Plan: Lipid panel   Meds ordered this encounter  Medications  . cyclobenzaprine (FLEXERIL) 5 MG tablet    Sig: 1 pill by mouth up to every 8 hours as needed. Start with one pill by mouth each bedtime as needed due to sedation    Dispense:  15 tablet    Refill:  0  . probenecid (BENEMID) 500 MG tablet    Sig: Take 1 tablet (500 mg total) by mouth daily.    Dispense:  90 tablet    Refill:  3  . colchicine (COLCRYS) 0.6 MG tablet    Sig: TAKE 1 TABLET BY MOUTH EVERY OTHER DAY    Dispense:  45 tablet    Refill:  3   Patient Instructions     Keeping you healthy  Get these tests  Blood pressure- Have your blood pressure checked once a year by your healthcare provider.  Normal blood pressure is 120/80  Weight- Have your body mass index (BMI) calculated to screen for  obesity.  BMI is a measure of body fat based  on height and weight. You can also calculate your own BMI at ViewBanking.si.  Cholesterol- Have your cholesterol checked every year.  Diabetes- Have your blood sugar checked regularly if you have high blood pressure, high cholesterol, have a family history of diabetes or if you are overweight.  Screening for Colon Cancer- Colonoscopy starting at age 54.  Screening may begin sooner depending on your family history and other health conditions. Follow up colonoscopy as directed by your Gastroenterologist.  Screening for Prostate Cancer- Both blood work (PSA) and a rectal exam help screen for Prostate Cancer.  Screening begins at age 4 with African-American men and at age 27 with Caucasian men.  Screening may begin sooner depending on your family history.  Take these medicines  Flu shot- Every fall.  Tetanus- Every 10 years.  Zostavax- Once after the age of 26 to prevent Shingles.  Pneumonia shot- Once after the age of 40; if you are younger than 37, ask your healthcare provider if you need a Pneumonia shot.  Take these steps  Don't smoke- If you do smoke, talk to your doctor about quitting.  For tips on how to quit, go to www.smokefree.gov or call 1-800-QUIT-NOW.  Be physically active- Exercise 5 days a week for at least 30 minutes.  If you are not already physically active start slow and gradually work up to 30 minutes of moderate physical activity.  Examples of moderate activity include walking briskly, mowing the yard, dancing, swimming, bicycling, etc.  Eat a healthy diet- Eat a variety of healthy food such as fruits, vegetables, low fat milk, low fat cheese, yogurt, lean meant, poultry, fish, beans, tofu, etc. For more information go to www.thenutritionsource.org  Drink alcohol in moderation- Limit alcohol intake to less than two drinks a day. Never drink and drive.  Dentist- Brush and floss twice daily; visit your dentist twice  a year.  Depression- Your emotional health is as important as your physical health. If you're feeling down, or losing interest in things you would normally enjoy please talk to your healthcare provider.  Eye exam- Visit your eye doctor every year.  Safe sex- If you may be exposed to a sexually transmitted infection, use a condom.  Seat belts- Seat belts can save your life; always wear one.  Smoke/Carbon Monoxide detectors- These detectors need to be installed on the appropriate level of your home.  Replace batteries at least once a year.  Skin cancer- When out in the sun, cover up and use sunscreen 15 SPF or higher.  Violence- If anyone is threatening you, please tell your healthcare provider.  Living Will/ Health care power of attorney- Speak with your healthcare provider and family.    IF you received an x-ray today, you will receive an invoice from [facility]. Please contact [facility] at [phone] with questions or concerns regarding your invoice.   IF you received labwork today, you will receive an invoice from [facility]. Please contact [facility] at [phone] with questions or concerns regarding your invoice.   Our billing staff will not be able to assist you with questions regarding bills from these companies.  You will be contacted with the lab results as soon as they are available. The fastest way to get your results is to activate your My Chart account. Instructions are located on the last page of this paperwork. If you have not heard from [place] regarding the results in 2 weeks, please contact this office.       I personally performed the  services described in this documentation, which was scribed in my presence. The recorded information has been reviewed and considered for accuracy and completeness, addended by me as needed, and agree with information above.  Signed,   Merri Ray, MD [facility].   03/11/18 8:41 AM

## 2018-03-12 LAB — COMPREHENSIVE METABOLIC PANEL
ALBUMIN: 4.4 g/dL (ref 3.5–5.5)
ALK PHOS: 50 IU/L (ref 39–117)
ALT: 20 IU/L (ref 0–44)
AST: 21 IU/L (ref 0–40)
Albumin/Globulin Ratio: 1.9 (ref 1.2–2.2)
BILIRUBIN TOTAL: 0.4 mg/dL (ref 0.0–1.2)
BUN / CREAT RATIO: 18 (ref 9–20)
BUN: 18 mg/dL (ref 6–24)
CHLORIDE: 103 mmol/L (ref 96–106)
CO2: 23 mmol/L (ref 20–29)
Calcium: 9.1 mg/dL (ref 8.7–10.2)
Creatinine, Ser: 1 mg/dL (ref 0.76–1.27)
GFR calc Af Amer: 96 mL/min/{1.73_m2} (ref >59)
GFR calc non Af Amer: 83 mL/min/{1.73_m2} (ref >59)
GLUCOSE: 121 mg/dL — AB (ref 65–99)
Globulin, Total: 2.3 g/dL (ref 1.5–4.5)
Potassium: 4.4 mmol/L (ref 3.5–5.2)
SODIUM: 140 mmol/L (ref 134–144)
Total Protein: 6.7 g/dL (ref 6.0–8.5)

## 2018-03-12 LAB — URIC ACID: Uric Acid: 7.1 mg/dL (ref 3.7–8.6)

## 2018-03-12 LAB — LIPID PANEL
CHOLESTEROL TOTAL: 167 mg/dL (ref 100–199)
Chol/HDL Ratio: 4.2 ratio (ref 0.0–5.0)
HDL: 40 mg/dL (ref >39)
LDL Calculated: 93 mg/dL (ref 0–99)
Triglycerides: 170 mg/dL — ABNORMAL HIGH (ref 0–149)
VLDL CHOLESTEROL CAL: 34 mg/dL (ref 5–40)

## 2018-03-14 ENCOUNTER — Other Ambulatory Visit: Payer: Self-pay

## 2018-03-14 ENCOUNTER — Ambulatory Visit: Payer: 59 | Admitting: Family Medicine

## 2018-03-14 ENCOUNTER — Encounter: Payer: Self-pay | Admitting: Family Medicine

## 2018-03-14 VITALS — BP 110/64 | HR 76 | Temp 98.4°F | Ht 75.0 in | Wt 210.0 lb

## 2018-03-14 DIAGNOSIS — S39012A Strain of muscle, fascia and tendon of lower back, initial encounter: Secondary | ICD-10-CM

## 2018-03-14 DIAGNOSIS — S39012D Strain of muscle, fascia and tendon of lower back, subsequent encounter: Secondary | ICD-10-CM

## 2018-03-14 DIAGNOSIS — M545 Low back pain, unspecified: Secondary | ICD-10-CM

## 2018-03-14 MED ORDER — MELOXICAM 7.5 MG PO TABS: 7.5000 mg | ORAL_TABLET | Freq: Every day | ORAL | 0 refills | Status: DC | PRN

## 2018-03-14 MED ORDER — CYCLOBENZAPRINE HCL 5 MG PO TABS: ORAL_TABLET | ORAL | 0 refills | Status: DC

## 2018-03-14 NOTE — Progress Notes (Signed)
Subjective:  By signing my name below, I, Brendan Short, attest that this documentation has been prepared under the direction and in the presence of Wendie Agreste, MD Electronically Signed: Ladene Artist, ED Scribe 03/14/2018 at 2:52 PM.   Patient ID: Brendan Short, male    DOB: 1961/01/27, 57 y.o.   MRN: [id number]  Chief Complaint  Patient presents with  . Back Pain    Lower back pain. Worse Sat and Sun   HPI Brendan Short is a 57 y.o. male who presents to Primary Care at [facility] for f/u of low back pain. Seen 4/29. Pt had pain that started 2 days prior while twisting his back. Tried flexeril, ibuprofen, icy hot and cold compresses. Mild degenerative changes on 121/2016 XR. On exam, pt had pain in R paraspinals without midline bony tenderness, strength intact. Suspected strain. Refilled flexeril, continued symptomatic care including TENS unit and out of work note given as he does work in [place] feels ~50% better. States pain improved until he tried to return to work today and noticed a flare up with flexing his foot and lifting his hip. Reports that he is still having some intermittent R-sided low back pain. He has used lidocaine patches, cold compresses, 1 flexeril tab every 8 hrs and 1200 mg ibuprofen daily. Denies bladder/bowel incontinence, saddle anesthesia, weakness in LE, numbness/tingling in LE.  Patient Active Problem List   Diagnosis Date Noted  . 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 Thrombocytopenia ([place]) 03/14/2016  . Elevated PSA 03/20/2014  . Gout 06/11/2012   Past Medical History:  Diagnosis Date  . Allergy   . 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 Heart murmur   . Thrombocytopenia ([place]) 03/14/2016   Past Surgical History:  Procedure Laterality Date  . broken nose  1970  . broken nose surgery, in the early 70"s    . HERNIA REPAIR     Allergies  Allergen Reactions  . Allopurinol    Prior to Admission medications   Medication Sig Start Date  End Date Taking? Authorizing Provider  cholecalciferol (VITAMIN D) 1000 UNITS tablet Take 1,000 Units by mouth daily.    [provider]  colchicine (COLCRYS) 0.6 MG tablet TAKE 1 TABLET BY MOUTH EVERY OTHER DAY 03/11/18   Wendie Agreste, MD  cyclobenzaprine (FLEXERIL) 5 MG tablet 1 pill by mouth up to every 8 hours as needed. Start with one pill by mouth each bedtime as needed due to sedation 03/11/18   Wendie Agreste, MD  diclofenac sodium (VOLTAREN) 1 % GEL Apply 1 application topically 2 (two) times daily. 02/03/16   [name], MD  fluticasone (FLONASE) 50 MCG/ACT nasal spray Place 1-2 sprays into both nostrils daily. 02/08/17   Wendie Agreste, MD  probenecid (BENEMID) 500 MG tablet Take 1 tablet (500 mg total) by mouth daily. 03/11/18   Wendie Agreste, MD   Social History   Socioeconomic History  . Marital status: Married    Spouse name: Not on file  . Number of children: Not on file  . Years of education: Not on file  . Highest education level: Not on file  Occupational History  . Occupation: Landscaper  Social Needs  . Financial resource strain: Not on file  . Food insecurity:    Worry: Not on file    Inability: Not on file  . Transportation needs:    Medical: Not on file    Non-medical: Not on file  Tobacco Use  .  Smoking status: Never Smoker  . Smokeless tobacco: Never Used  Substance and Sexual Activity  . Alcohol use: Yes    Alcohol/week: 0.0 oz    Comment: few beers  . Drug use: No  . Sexual activity: Yes  Lifestyle  . Physical activity:    Days per week: Not on file    Minutes per session: Not on file  . Stress: Not on file  Relationships  . Social connections:    Talks on phone: Not on file    Gets together: Not on file    Attends religious service: Not on file    Active member of club or organization: Not on file    Attends meetings of clubs or organizations: Not on file    Relationship status: Not on file  . Intimate partner  violence:    Fear of current or ex partner: Not on file    Emotionally abused: Not on file    Physically abused: Not on file    Forced sexual activity: Not on file  Other Topics Concern  . Not on file  Social History Narrative   Married   Education: College   Exercise: Yes   Review of Systems  Genitourinary: Negative for enuresis.  Musculoskeletal: Positive for back pain.  Neurological: Negative for weakness and numbness.      Objective:   Physical Exam  Constitutional: He is oriented to person, place, and time. He appears well-developed and well-nourished. No distress.  HENT:  Head: Normocephalic and atraumatic.  Eyes: Conjunctivae and EOM are normal.  Neck: Neck supple. No tracheal deviation present.  Cardiovascular: Normal rate.  Pulmonary/Chest: Effort normal. No respiratory distress.  Musculoskeletal:  Appreciable spasm. Tenderness along R paraspinals towards SI joint. Slight apex L scoliosis of L mid back with muscle prominence on R paraspinals. Lumbar spine flexion to 80 degrees. Rotation intact. Guarding with R greater than L lateral flexion. Able to walk on toes and heels.  Neurological: He is alert and oriented to person, place, and time.  Skin: Skin is warm and dry.  Psychiatric: He has a normal mood and affect. His behavior is normal.  Nursing note and vitals reviewed.  Vitals:   03/14/18 1435  BP: 110/64  Pulse: 76  Temp: 98.4 F (36.9 C)  TempSrc: Oral  SpO2: 98%  Weight: 210 lb (95.3 kg)  Height: 6' 3"  (1.905 m)      Assessment & Plan:   Brendan Short is a 57 y.o. male Low back strain, subsequent encounter - Plan: meloxicam (MOBIC) 7.5 MG tablet  Acute right-sided low back pain without sciatica - Plan: cyclobenzaprine (FLEXERIL) 5 MG tablet  Low back strain, initial encounter - Plan: cyclobenzaprine (FLEXERIL) 5 MG tablet  -He has experienced some improvement, overall reassuring exam.  Suspected sprain/strain versus disc herniation but with  improvement in symptoms we will continue current regimen. Does have some days ahead for potential rest from work and anticipate continued improvement.   - continue Flexeril, change ibuprofen to meloxicam 1/day as needed, potential side effects, long-term risk discussed.  -If not continuing to improve into next week, consider repeat eval and imaging.   RTC precautions given.   Meds ordered this encounter  Medications  . meloxicam (MOBIC) 7.5 MG tablet    Sig: Take 1 tablet (7.5 mg total) by mouth daily as needed for pain.    Dispense:  30 tablet    Refill:  0  . cyclobenzaprine (FLEXERIL) 5 MG tablet    Sig:  1 pill by mouth up to every 8 hours as needed. Start with one pill by mouth each bedtime as needed due to sedation    Dispense:  15 tablet    Refill:  0   Patient Instructions   Back pain still appears to be due to a strain/overuse injury.  As it has had some improvement, I would like to give it a little bit more time.  Change the ibuprofen to meloxicam 1 per day.  Do not combine that medicine with anything over-the-counter other than Tylenol.  Continue Flexeril up to every 8 hours as needed, and if not improving into next week, return for x-rays and discussion of possible prednisone.   Return to the clinic or go to the nearest emergency room if any of your symptoms worsen or new symptoms occur.   Low Back Strain A strain is a stretch or tear in a muscle or the strong cords of tissue that attach muscle to bone (tendons). Strains of the lower back (lumbar spine) are a common cause of low back pain. A strain occurs when muscles or tendons are torn or are stretched beyond their limits. The muscles may become inflamed, resulting in pain and sudden muscle tightening (spasms). A strain can happen suddenly due to an injury (trauma), or it can develop gradually due to overuse. There are three types of strains:  Grade 1 is a mild strain involving a minor tear of the muscle fibers or tendons. This  may cause some pain but no loss of muscle strength.  Grade 2 is a moderate strain involving a partial tear of the muscle fibers or tendons. This causes more severe pain and some loss of muscle strength.  Grade 3 is a severe strain involving a complete tear of the muscle or tendon. This causes severe pain and complete or nearly complete loss of muscle strength.  What are the causes? This condition may be caused by:  Trauma, such as a fall or a hit to the body.  Twisting or overstretching the back. This may result from doing activities that require a lot of energy, such as lifting heavy objects.  What increases the risk? The following factors may increase your risk of getting this condition:  Playing contact sports.  Participating in sports or activities that put excessive stress on the back and require a lot of bending and twisting, including: ? Lifting weights or heavy objects. ? Gymnastics. ? Soccer. ? Figure skating. ? Snowboarding.  Being overweight or obese.  Having poor strength and flexibility.  What are the signs or symptoms? Symptoms of this condition may include:  Sharp or dull pain in the lower back that does not go away. Pain may extend to the buttocks.  Stiffness.  Limited range of motion.  Inability to stand up straight due to stiffness or pain.  Muscle spasms.  How is this diagnosed? This condition may be diagnosed based on:  Your symptoms.  Your medical history.  A physical exam. ? Your health care provider may push on certain areas of your back to determine the source of your pain. ? You may be asked to bend forward, backward, and side to side to assess the severity of your pain and your range of motion.  Imaging tests, such as: ? X-rays. ? MRI.  How is this treated? Treatment for this condition may include:  Applying heat and cold to the affected area.  Medicines to help relieve pain and to relax your muscles (muscle relaxants).  NSAIDs  to help reduce swelling and discomfort.  Physical therapy.  When your symptoms improve, it is important to gradually return to your normal routine as soon as possible to reduce pain, avoid stiffness, and avoid loss of muscle strength. Generally, symptoms should improve within 6 weeks of treatment. However, recovery time varies. Follow these instructions at home: Managing pain, stiffness, and swelling  If directed, apply ice to the injured area during the first 24 hours after your injury. ? Put ice in a plastic bag. ? Place a towel between your skin and the bag. ? Leave the ice on for 20 minutes, 2-3 times a day.  If directed, apply heat to the affected area as often as told by your health care provider. Use the heat source that your health care provider recommends, such as a moist heat pack or a heating pad. ? Place a towel between your skin and the heat source. ? Leave the heat on for 20-30 minutes. ? Remove the heat if your skin turns bright red. This is especially important if you are unable to feel pain, heat, or cold. You may have a greater risk of getting burned. Activity  Rest and return to your normal activities as told by your health care provider. Ask your health care provider what activities are safe for you.  Avoid activities that take a lot of effort (are strenuous) for as long as told by your health care provider.  Do exercises as told by your health care provider. General instructions   Take over-the-counter and prescription medicines only as told by your health care provider.  If you have questions or concerns about safety while taking pain medicine, talk with your health care provider.  Do not drive or operate heavy machinery until you know how your pain medicine affects you.  Do not use any tobacco products, such as cigarettes, chewing tobacco, and e-cigarettes. Tobacco can delay bone healing. If you need help quitting, ask your health care provider.  Keep all  follow-up visits as told by your health care provider. This is important. How is this prevented?  Warm up and stretch before being active.  Cool down and stretch after being active.  Give your body time to rest between periods of activity.  Avoid: ? Being physically inactive for long periods at a time. ? Exercising or playing sports when you are tired or in pain.  Use correct form when playing sports and lifting heavy objects.  Use good posture when sitting and standing.  Maintain a healthy weight.  Sleep on a mattress with medium firmness to support your back.  Make sure to use equipment that fits you, including shoes that fit well.  Be safe and responsible while being active to avoid falls.  Do at least 150 minutes of moderate-intensity exercise each week, such as brisk walking or water aerobics. Try a form of exercise that takes stress off your back, such as swimming or stationary cycling.  Maintain physical fitness, including: ? Strength. ? Flexibility. ? Cardiovascular fitness. ? Endurance. Contact a health care provider if:  Your back pain does not improve after 6 weeks of treatment.  Your symptoms get worse. Get help right away if:  Your back pain is severe.  You are unable to stand or walk.  You develop pain in your legs.  You develop weakness in your buttocks or legs.  You have difficulty controlling when you urinate or when you have a bowel movement. This information is not intended to  replace advice given to you by your health care provider. Make sure you discuss any questions you have with your health care provider. Document Released: 10/30/2005 Document Revised: 07/06/2016 Document Reviewed: 08/11/2015 Elsevier Interactive Patient Education  2017 [employer].   IF you received an x-ray today, you will receive an invoice from [facility]. Please contact [facility] at [phone] with questions or concerns regarding your invoice.     IF you received labwork today, you will receive an invoice from [place]. Please contact [facility] at [phone] with questions or concerns regarding your invoice.   Our billing staff will not be able to assist you with questions regarding bills from these companies.  You will be contacted with the lab results as soon as they are available. The fastest way to get your results is to activate your My Chart account. Instructions are located on the last page of this paperwork. If you have not heard from [place] regarding the results in 2 weeks, please contact this office.       I personally performed the services described in this documentation, which was scribed in my presence. The recorded information has been reviewed and considered for accuracy and completeness, addended by me as needed, and agree with information above.  Signed,   Merri Ray, MD Primary [facility] at [place].  03/15/18 1:50 PM

## 2018-03-14 NOTE — Patient Instructions (Addendum)
Back pain still appears to be due to a strain/overuse injury.  As it has had some improvement, I would like to give it a little bit more time.  Change the ibuprofen to meloxicam 1 per day.  Do not combine that medicine with anything over-the-counter other than Tylenol.  Continue Flexeril up to every 8 hours as needed, and if not improving into next week, return for x-rays and discussion of possible prednisone.   Return to the clinic or go to the nearest emergency room if any of your symptoms worsen or new symptoms occur.   Low Back Strain A strain is a stretch or tear in a muscle or the strong cords of tissue that attach muscle to bone (tendons). Strains of the lower back (lumbar spine) are a common cause of low back pain. A strain occurs when muscles or tendons are torn or are stretched beyond their limits. The muscles may become inflamed, resulting in pain and sudden muscle tightening (spasms). A strain can happen suddenly due to an injury (trauma), or it can develop gradually due to overuse. There are three types of strains:  Grade 1 is a mild strain involving a minor tear of the muscle fibers or tendons. This may cause some pain but no loss of muscle strength.  Grade 2 is a moderate strain involving a partial tear of the muscle fibers or tendons. This causes more severe pain and some loss of muscle strength.  Grade 3 is a severe strain involving a complete tear of the muscle or tendon. This causes severe pain and complete or nearly complete loss of muscle strength.  What are the causes? This condition may be caused by:  Trauma, such as a fall or a hit to the body.  Twisting or overstretching the back. This may result from doing activities that require a lot of energy, such as lifting heavy objects.  What increases the risk? The following factors may increase your risk of getting this condition:  Playing contact sports.  Participating in sports or activities that put excessive stress on  the back and require a lot of bending and twisting, including: ? Lifting weights or heavy objects. ? Gymnastics. ? Soccer. ? Figure skating. ? Snowboarding.  Being overweight or obese.  Having poor strength and flexibility.  What are the signs or symptoms? Symptoms of this condition may include:  Sharp or dull pain in the lower back that does not go away. Pain may extend to the buttocks.  Stiffness.  Limited range of motion.  Inability to stand up straight due to stiffness or pain.  Muscle spasms.  How is this diagnosed? This condition may be diagnosed based on:  Your symptoms.  Your medical history.  A physical exam. ? Your health care provider may push on certain areas of your back to determine the source of your pain. ? You may be asked to bend forward, backward, and side to side to assess the severity of your pain and your range of motion.  Imaging tests, such as: ? X-rays. ? MRI.  How is this treated? Treatment for this condition may include:  Applying heat and cold to the affected area.  Medicines to help relieve pain and to relax your muscles (muscle relaxants).  NSAIDs to help reduce swelling and discomfort.  Physical therapy.  When your symptoms improve, it is important to gradually return to your normal routine as soon as possible to reduce pain, avoid stiffness, and avoid loss of muscle strength. Generally, symptoms should  improve within 6 weeks of treatment. However, recovery time varies. Follow these instructions at home: Managing pain, stiffness, and swelling  If directed, apply ice to the injured area during the first 24 hours after your injury. ? Put ice in a plastic bag. ? Place a towel between your skin and the bag. ? Leave the ice on for 20 minutes, 2-3 times a day.  If directed, apply heat to the affected area as often as told by your health care provider. Use the heat source that your health care provider recommends, such as a moist heat  pack or a heating pad. ? Place a towel between your skin and the heat source. ? Leave the heat on for 20-30 minutes. ? Remove the heat if your skin turns bright red. This is especially important if you are unable to feel pain, heat, or cold. You may have a greater risk of getting burned. Activity  Rest and return to your normal activities as told by your health care provider. Ask your health care provider what activities are safe for you.  Avoid activities that take a lot of effort (are strenuous) for as long as told by your health care provider.  Do exercises as told by your health care provider. General instructions   Take over-the-counter and prescription medicines only as told by your health care provider.  If you have questions or concerns about safety while taking pain medicine, talk with your health care provider.  Do not drive or operate heavy machinery until you know how your pain medicine affects you.  Do not use any tobacco products, such as cigarettes, chewing tobacco, and e-cigarettes. Tobacco can delay bone healing. If you need help quitting, ask your health care provider.  Keep all follow-up visits as told by your health care provider. This is important. How is this prevented?  Warm up and stretch before being active.  Cool down and stretch after being active.  Give your body time to rest between periods of activity.  Avoid: ? Being physically inactive for long periods at a time. ? Exercising or playing sports when you are tired or in pain.  Use correct form when playing sports and lifting heavy objects.  Use good posture when sitting and standing.  Maintain a healthy weight.  Sleep on a mattress with medium firmness to support your back.  Make sure to use equipment that fits you, including shoes that fit well.  Be safe and responsible while being active to avoid falls.  Do at least 150 minutes of moderate-intensity exercise each week, such as brisk  walking or water aerobics. Try a form of exercise that takes stress off your back, such as swimming or stationary cycling.  Maintain physical fitness, including: ? Strength. ? Flexibility. ? Cardiovascular fitness. ? Endurance. Contact a health care provider if:  Your back pain does not improve after 6 weeks of treatment.  Your symptoms get worse. Get help right away if:  Your back pain is severe.  You are unable to stand or walk.  You develop pain in your legs.  You develop weakness in your buttocks or legs.  You have difficulty controlling when you urinate or when you have a bowel movement. This information is not intended to replace advice given to you by your health care provider. Make sure you discuss any questions you have with your health care provider. Document Released: 10/30/2005 Document Revised: 07/06/2016 Document Reviewed: 08/11/2015 Elsevier Interactive Patient Education  2017 [employer]  you received an x-ray today, you will receive an invoice from [facility]. Please contact [facility] at [phone] with questions or concerns regarding your invoice.   IF you received labwork today, you will receive an invoice from [place]. Please contact [facility] at [phone] with questions or concerns regarding your invoice.   Our billing staff will not be able to assist you with questions regarding bills from these companies.  You will be contacted with the lab results as soon as they are available. The fastest way to get your results is to activate your My Chart account. Instructions are located on the last page of this paperwork. If you have not heard from [place] regarding the results in 2 weeks, please contact this office.

## 2018-03-20 ENCOUNTER — Telehealth: Payer: Self-pay | Admitting: Family Medicine

## 2018-03-20 NOTE — Telephone Encounter (Signed)
Please advise 

## 2018-03-20 NOTE — Telephone Encounter (Unsigned)
Copied from [place] [phone]. Topic: Quick Communication - Rx Refill/Question >> Mar 20, 2018 10:31 AM Scherrie Gerlach wrote: Medication:  prednisone (?)  Pt states he is still having issues with his lower back pain.  Pt has sent the dr last [place] 5/02, for this issue.  Pt saw the dr in his work ([place]), and she recommend to see if the dr would proceed with a prednisone script to see if that will help. Dr Nyoka Cowden had mentioned this regimen after trying the mobic and flexeril if these did not work. Pt would like Dr Nyoka Cowden to prescribe this (since it happened away from work). [id number](Phone) [phone](Fax)

## 2018-03-21 ENCOUNTER — Telehealth: Payer: Self-pay

## 2018-03-21 NOTE — Telephone Encounter (Signed)
Copied from [place] [phone]. Topic: Quick Communication - Rx Refill/Question >> Mar 20, 2018 10:31 AM Scherrie Gerlach wrote: Medication:  prednisone (?)  Pt states he is still having issues with his lower back pain.  Pt has sent the dr last [place] 5/02, for this issue.  Pt saw the dr in his work ([place]), and she recommend to see if the dr would proceed with a prednisone script to see if that will help. Dr Nyoka Cowden had mentioned this regimen after trying the mobic and flexeril if these did not work. Pt would like Dr Nyoka Cowden to prescribe this (since it happened away from work). [id number] [place]Phone) [phone](Fax)     >> Mar 21, 2018  8:26 AM RPricilla Handlerwrote: Patient called to follow up on his medication request to Dr. GCarlota Raspberry

## 2018-03-21 NOTE — Telephone Encounter (Signed)
Please advise 

## 2018-03-22 ENCOUNTER — Ambulatory Visit: Payer: 59 | Admitting: Emergency Medicine

## 2018-03-22 ENCOUNTER — Encounter: Payer: Self-pay | Admitting: Emergency Medicine

## 2018-03-22 ENCOUNTER — Other Ambulatory Visit: Payer: Self-pay

## 2018-03-22 VITALS — BP 120/80 | HR 80 | Temp 98.7°F | Resp 16 | Ht 72.5 in | Wt 206.0 lb

## 2018-03-22 DIAGNOSIS — M62838 Other muscle spasm: Secondary | ICD-10-CM

## 2018-03-22 DIAGNOSIS — M545 Low back pain, unspecified: Secondary | ICD-10-CM

## 2018-03-22 DIAGNOSIS — M7918 Myalgia, other site: Secondary | ICD-10-CM

## 2018-03-22 MED ORDER — HYDROCODONE-ACETAMINOPHEN 5-325 MG PO TABS: 1.0000 | ORAL_TABLET | Freq: Four times a day (QID) | ORAL | 0 refills | Status: DC | PRN

## 2018-03-22 MED ORDER — METHYLPREDNISOLONE ACETATE 80 MG/ML IJ SUSP
80.0000 mg | Freq: Once | INTRAMUSCULAR | Status: AC
  Administered 2018-03-22: 80 mg via INTRAMUSCULAR

## 2018-03-22 MED ORDER — PREDNISONE 20 MG PO TABS: 40.0000 mg | ORAL_TABLET | Freq: Every day | ORAL | 0 refills | Status: AC

## 2018-03-22 NOTE — Telephone Encounter (Signed)
He was seen today by Dr. Mitchel Honour and started on prednisone.

## 2018-03-22 NOTE — Progress Notes (Signed)
Brendan Short 57 y.o.   Chief Complaint  Patient presents with  . Back Pain    per pt certain movements still hurt back," leaning forward and when taking a step, getting in/out back" Meloxicam and Flexeril have helped some, but I thought it would be better.    HISTORY OF PRESENT ILLNESS: This is a 57 y.o. male complaining of right-sided lumbar pain.  Has been on meloxicam and Flexeril with some help but still has significant pain.  Seen last here 03/14/2018.  Denies any bowel or bladder problems.  No numbness or tingling to the legs.  No focal weakness.  Back Pain  This is a recurrent problem. The current episode started in the past 7 days. The problem occurs constantly. The problem has been waxing and waning since onset. The pain is present in the lumbar spine. The quality of the pain is described as aching. The pain does not radiate. The pain is at a severity of 7/10. The pain is moderate. The pain is the same all the time. The symptoms are aggravated by bending, standing and position. Pertinent negatives include no abdominal pain, bladder incontinence, bowel incontinence, chest pain, dysuria, fever, headaches, leg pain, numbness, paresis, paresthesias, pelvic pain, perianal numbness, tingling, weakness or weight loss. Risk factors: none. He has tried muscle relaxant, NSAIDs and heat for the symptoms. The treatment provided mild relief.     Prior to Admission medications   Medication Sig Start Date End Date Taking? Authorizing Provider  cholecalciferol (VITAMIN D) 1000 UNITS tablet Take 1,000 Units by mouth daily.   Yes [provider]  colchicine (COLCRYS) 0.6 MG tablet TAKE 1 TABLET BY MOUTH EVERY OTHER DAY 03/11/18  Yes Wendie Agreste, MD  cyclobenzaprine (FLEXERIL) 5 MG tablet 1 pill by mouth up to every 8 hours as needed. Start with one pill by mouth each bedtime as needed due to sedation 03/14/18  Yes Wendie Agreste, MD  diclofenac sodium (VOLTAREN) 1 % GEL Apply 1  application topically 2 (two) times daily. 02/03/16  Yes [name], MD  fluticasone (FLONASE) 50 MCG/ACT nasal spray Place 1-2 sprays into both nostrils daily. 02/08/17  Yes Wendie Agreste, MD  meloxicam (MOBIC) 7.5 MG tablet Take 1 tablet (7.5 mg total) by mouth daily as needed for pain. 03/14/18  Yes Wendie Agreste, MD  probenecid (BENEMID) 500 MG tablet Take 1 tablet (500 mg total) by mouth daily. 03/11/18  Yes Wendie Agreste, MD    Allergies  Allergen Reactions  . Allopurinol     Patient Active Problem List   Diagnosis Date Noted  . 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 Thrombocytopenia ([place]) 03/14/2016  . Elevated PSA 03/20/2014  . Gout 06/11/2012    Past Medical History:  Diagnosis Date  . Allergy   . 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 Heart murmur   . Thrombocytopenia ([place]) 03/14/2016    Past Surgical History:  Procedure Laterality Date  . broken nose  1970  . broken nose surgery, in the early 70"s    . HERNIA REPAIR      Social History   Socioeconomic History  . Marital status: Married    Spouse name: Not on file  . Number of children: Not on file  . Years of education: Not on file  . Highest education level: Not on file  Occupational History  . Occupation: Landscaper  Social Needs  . Financial resource strain: Not on file  . Food insecurity:  Worry: Not on file    Inability: Not on file  . Transportation needs:    Medical: Not on file    Non-medical: Not on file  Tobacco Use  . Smoking status: Never Smoker  . Smokeless tobacco: Never Used  Substance and Sexual Activity  . Alcohol use: Yes    Alcohol/week: 0.0 oz    Comment: few beers  . Drug use: No  . Sexual activity: Yes  Lifestyle  . Physical activity:    Days per week: Not on file    Minutes per session: Not on file  . Stress: Not on file  Relationships  . Social connections:    Talks on phone: Not on file    Gets together: Not on file    Attends religious  service: Not on file    Active member of club or organization: Not on file    Attends meetings of clubs or organizations: Not on file    Relationship status: Not on file  . Intimate partner violence:    Fear of current or ex partner: Not on file    Emotionally abused: Not on file    Physically abused: Not on file    Forced sexual activity: Not on file  Other Topics Concern  . Not on file  Social History Narrative   Married   Education: College   Exercise: Yes    Family History  Problem Relation Age of Onset  . Heart disease Mother   . Cancer Father   . Heart disease Maternal Grandmother      Review of Systems  Constitutional: Negative for fever and weight loss.  HENT: Negative.  Negative for sore throat.   Eyes: Negative.   Respiratory: Negative for shortness of breath.   Cardiovascular: Negative for chest pain and palpitations.  Gastrointestinal: Negative for abdominal pain, blood in stool, bowel incontinence, melena, nausea and vomiting.  Genitourinary: Negative for bladder incontinence, dysuria, hematuria and pelvic pain.  Musculoskeletal: Positive for back pain.  Skin: Negative for rash.  Neurological: Negative for tingling, sensory change, focal weakness, weakness, numbness, headaches and paresthesias.  Endo/Heme/Allergies: Negative.   All other systems reviewed and are negative.     Vitals:   03/22/18 0931  BP: 120/80  Pulse: 80  Resp: 16  Temp: 98.7 F (37.1 C)  SpO2: 96%    Physical Exam  Constitutional: He is oriented to person, place, and time. He appears well-developed and well-nourished.  HENT:  Head: Normocephalic and atraumatic.  Eyes: Pupils are equal, round, and reactive to light. EOM are normal.  Neck: Normal range of motion. Neck supple.  Cardiovascular: Normal rate and regular rhythm.  Pulmonary/Chest: Effort normal and breath sounds normal.  Abdominal: Soft. He exhibits no distension. There is no tenderness.  Musculoskeletal:        Lumbar back: He exhibits decreased range of motion, tenderness and spasm. He exhibits no bony tenderness and normal pulse.       Back:  Neurological: He is alert and oriented to person, place, and time. He displays normal reflexes. No sensory deficit. He exhibits normal muscle tone. Coordination normal.  Skin: Skin is warm and dry. Capillary refill takes less than 2 seconds.  Psychiatric: He has a normal mood and affect. His behavior is normal.  Vitals reviewed.    ASSESSMENT & PLAN: Dustan was seen today for back pain.  Diagnoses and all orders for this visit:  Lumbar pain -     methylPREDNISolone acetate (DEPO-MEDROL) injection 80 mg -  HYDROcodone-acetaminophen (NORCO) 5-325 MG tablet; Take 1 tablet by mouth every 6 (six) hours as needed for moderate pain. -     predniSONE (DELTASONE) 20 MG tablet; Take 2 tablets (40 mg total) by mouth daily with breakfast for 5 days.  Muscle spasm  Musculoskeletal pain      Agustina Caroli, MD [facility]

## 2018-03-22 NOTE — Patient Instructions (Addendum)
     IF you received an x-ray today, you will receive an invoice from [facility]. Please contact [facility] at [phone] with questions or concerns regarding your invoice.   IF you received labwork today, you will receive an invoice from [facility]. Please contact [facility] at [phone] with questions or concerns regarding your invoice.   Our billing staff will not be able to assist you with questions regarding bills from these companies.  You will be contacted with the lab results as soon as they are available. The fastest way to get your results is to activate your My Chart account. Instructions are located on the last page of this paperwork. If you have not heard from us regarding the results in 2 weeks, please contact this office.      Back Pain, Adult Back pain is very common. The pain often gets better over time. The cause of back pain is usually not dangerous. Most people can learn to manage their back pain on their own. Follow these instructions at home: Watch your back pain for any changes. The following actions may help to lessen any pain you are feeling:  Stay active. Start with short walks on flat ground if you can. Try to walk farther each day.  Exercise regularly as told by your doctor. Exercise helps your back heal faster. It also helps avoid future injury by keeping your muscles strong and flexible.  Do not sit, drive, or stand in one place for more than 30 minutes.  Do not stay in bed. Resting more than 1-2 days can slow down your recovery.  Be careful when you bend or lift an object. Use good form when lifting: ? Bend at your knees. ? Keep the object close to your body. ? Do not twist.  Sleep on a firm mattress. Lie on your side, and bend your knees. If you lie on your back, put a pillow under your knees.  Take medicines only as told by your doctor.  Put ice on the injured area. ? Put ice in a plastic bag. ? Place a towel between your  skin and the bag. ? Leave the ice on for 20 minutes, 2-3 times a day for the first 2-3 days. After that, you can switch between ice and heat packs.  Avoid feeling anxious or stressed. Find good ways to deal with stress, such as exercise.  Maintain a healthy weight. Extra weight puts stress on your back.  Contact a doctor if:  You have pain that does not go away with rest or medicine.  You have worsening pain that goes down into your legs or buttocks.  You have pain that does not get better in one week.  You have pain at night.  You lose weight.  You have a fever or chills. Get help right away if:  You cannot control when you poop (bowel movement) or pee (urinate).  Your arms or legs feel weak.  Your arms or legs lose feeling (numbness).  You feel sick to your stomach (nauseous) or throw up (vomit).  You have belly (abdominal) pain.  You feel like you may pass out (faint). This information is not intended to replace advice given to you by your health care provider. Make sure you discuss any questions you have with your health care provider. Document Released: 04/17/2008 Document Revised: 04/06/2016 Document Reviewed: 03/03/2014 Elsevier Interactive Patient Education  2018 [employer]  

## 2018-03-25 ENCOUNTER — Ambulatory Visit: Payer: 59 | Admitting: Family Medicine

## 2018-03-25 ENCOUNTER — Encounter: Payer: Self-pay | Admitting: Family Medicine

## 2018-03-25 VITALS — BP 110/64 | HR 83 | Temp 97.8°F | Ht 75.0 in | Wt 203.8 lb

## 2018-03-25 DIAGNOSIS — S39012D Strain of muscle, fascia and tendon of lower back, subsequent encounter: Secondary | ICD-10-CM | POA: Diagnosis not present

## 2018-03-25 NOTE — Patient Instructions (Addendum)
I'm glad to hear that the back pain is improving on prednisone. Range of motion and gentle stretches are ok, but continue to avoid heavy lifting, twisting and bending until the pain has significantly improved (then return to these activities slowly).   Ok to take flexeril or hydrocodone at night if needed (try not to take these at the same time). If not continuing to improve into next week, then return for xray, possible repeat prednisone and we may also order an MRI at that point. Return to the clinic or go to the nearest emergency room if any of your symptoms worsen or new symptoms occur.  Thanks for coming in today.    Back Pain, Adult Many adults have back pain from time to time. Common causes of back pain include:  A strained muscle or ligament.  Wear and tear (degeneration) of the spinal disks.  Arthritis.  A hit to the back.  Back pain can be short-lived (acute) or last a long time (chronic). A physical exam, lab tests, and imaging studies may be done to find the cause of your pain. Follow these instructions at home: Managing pain and stiffness  Take over-the-counter and prescription medicines only as told by your health care provider.  If directed, apply heat to the affected area as often as told by your health care provider. Use the heat source that your health care provider recommends, such as a moist heat pack or a heating pad. ? Place a towel between your skin and the heat source. ? Leave the heat on for 20-30 minutes. ? Remove the heat if your skin turns bright red. This is especially important if you are unable to feel pain, heat, or cold. You have a greater risk of getting burned.  If directed, apply ice to the injured area: ? Put ice in a plastic bag. ? Place a towel between your skin and the bag. ? Leave the ice on for 20 minutes, 2-3 times a day for the first 2-3 days. Activity  Do not stay in bed. Resting more than 1-2 days can delay your recovery.  Take  short walks on even surfaces as soon as you are able. Try to increase the length of time you walk each day.  Do not sit, drive, or stand in one place for more than 30 minutes at a time. Sitting or standing for long periods of time can put stress on your back.  Use proper lifting techniques. When you bend and lift, use positions that put less stress on your back: ? [place] your knees. ? Keep the load close to your body. ? Avoid twisting.  Exercise regularly as told by your health care provider. Exercising will help your back heal faster. This also helps prevent back injuries by keeping muscles strong and flexible.  Your health care provider may recommend that you see a physical therapist. This person can help you come up with a safe exercise program. Do any exercises as told by your physical therapist. Lifestyle  Maintain a healthy weight. Extra weight puts stress on your back and makes it difficult to have good posture.  Avoid activities or situations that make you feel anxious or stressed. Learn ways to manage anxiety and stress. One way to manage stress is through exercise. Stress and anxiety increase muscle tension and can make back pain worse. General instructions  Sleep on a firm mattress in a comfortable position. Try lying on your side with your knees slightly bent. If you lie on  your back, put a pillow under your knees.  Follow your treatment plan as told by your health care provider. This may include: ? Cognitive or behavioral therapy. ? Acupuncture or massage therapy. ? Meditation or yoga. Contact a health care provider if:  You have pain that is not relieved with rest or medicine.  You have increasing pain going down into your legs or buttocks.  Your pain does not improve in 2 weeks.  You have pain at night.  You lose weight.  You have a fever or chills. Get help right away if:  You develop new bowel or bladder control problems.  You have unusual weakness or numbness  in your arms or legs.  You develop nausea or vomiting.  You develop abdominal pain.  You feel faint. Summary  Many adults have back pain from time to time. A physical exam, lab tests, and imaging studies may be done to find the cause of your pain.  Use proper lifting techniques. When you bend and lift, use positions that put less stress on your back.  Take over-the-counter and prescription medicines and apply heat or ice as directed by your health care provider. This information is not intended to replace advice given to you by your health care provider. Make sure you discuss any questions you have with your health care provider. Document Released: 10/30/2005 Document Revised: 12/04/2016 Document Reviewed: 12/04/2016 Elsevier Interactive Patient Education  2018 [employer].   IF you received an x-ray today, you will receive an invoice from [facility]. Please contact [facility] at [phone] with questions or concerns regarding your invoice.   IF you received labwork today, you will receive an invoice from [facility]. Please contact [facility] at [phone] with questions or concerns regarding your invoice.   Our billing staff will not be able to assist you with questions regarding bills from these companies.  You will be contacted with the lab results as soon as they are available. The fastest way to get your results is to activate your My Chart account. Instructions are located on the last page of this paperwork. If you have not heard from [place] regarding the results in 2 weeks, please contact this office.

## 2018-03-25 NOTE — Progress Notes (Signed)
Subjective:  By signing my name below, I, Brendan Short, attest that this documentation has been prepared under the direction and in the presence of Merri Ray, MD. Electronically Signed: [name], [place]. 03/25/2018 , 12:24 PM .  Patient was seen in Room 12 .   Patient ID: Brendan Short, male    DOB: [birth year], 57 y.o.   MRN: [id number] Chief Complaint  Patient presents with  . Back Pain    follow up (pain today 4)   HPI Brendan Short is a 57 y.o. male Here for follow up of low back pain. He was initially seen on April 29th, pain started 2 days prior with twisting in his low back. He had tried flexeril, ibuprofen, icy hot and cold compresses. He had right paraspinal tenderness without bony tenderness. Suspected straining treated with flexeril, and continued home TENS unit. When seen on May 2nd, he was approximately 50% better. He did have a flare when returning to work that day. He was using lidocaine patches, flexeril and ibuprofen. No apparent red flags on history or exam that day and with improving symptoms, continued on flexeril, ibuprofen to meloxicam. Telephone note on May 8th, reported had seen city work doctor who had mentioned prednisone. He was seen 3 days ago in office by Dr. Mitchel Honour, treated with Depo-Medrol 52m IM, prednisone 421mQD x5 days, and hydrocodone 53m63mvery 6 hours as needed. Most recent imaging in 2016, mild degenerative changes without acute abnormality.   Patient reports having some trouble sleeping last night while on prednisone. He notes running out of flexeril on Thursday 03/28/18. Aside from last night, his sleeping has overall been better. He hasn't started hydrocodone yet. He rated pain at 6 while in office 3 days ago, and rates pain at 4 today. 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 He tried using a backpack leaf blower today without worsening of symptoms. He denies lower extremity weakness, urinary/bowel incontinence,  or saddle anesthesia.   Patient Active Problem List   Diagnosis Date Noted  . [facility]-induced [facility] ([facility]  . Thrombocytopenia ([facility]/12/2015  . Elevated PSA 03/20/2014  . Gout 06/11/2012   Past Medical History:  Diagnosis Date  . Allergy   . 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 Heart murmur   . [place]ocyt[place] ([facility]12/[place]   Past Surgical History:  Procedure Laterality Date  . broken nose  1970  . broken nose surgery, in the early 70"s    . HERNIA REPAIR     Allergies  Allergen Reactions  . Allopurinol    Prior to Admission medications   Medication Sig Start Date End Date Taking? Authorizing Provider  cholecalciferol (VITAMIN D) 1000 UNITS tablet Take 1,000 Units by mouth daily.    [provider]  colchicine (COLCRYS) 0.6 MG tablet TAKE 1 TABLET BY MOUTH EVERY OTHER DAY 03/11/18   GreWendie AgresteD  cyclobenzaprine (FLEXERIL) 5 MG tablet 1 pill by mouth up to every 8 hours as needed. Start with one pill by mouth each bedtime as needed due to sedation 03/14/18   GreWendie AgresteD  diclofenac sodium (VOLTAREN) 1 % GEL Apply 1 application topically 2 (two) times daily. 02/03/16   DauDarlyne RussianD  fluticasone (FLONASE) 50 MCG/ACT nasal spray Place 1-2 sprays into both nostrils daily. 02/08/17   GreWendie AgresteD  HYDROcodone-acetaminophen (NORCO) 5-325 MG tablet Take 1 tablet by mouth every 6 (six) hours as needed for moderate  pain. 03/22/18   Horald Pollen, MD  meloxicam (MOBIC) 7.5 MG tablet Take 1 tablet (7.5 mg total) by mouth daily as needed for pain. 03/14/18   Wendie Agreste, MD  predniSONE (DELTASONE) 20 MG tablet Take 2 tablets (40 mg total) by mouth daily with breakfast for 5 days. 03/22/18 03/27/18  Horald Pollen, MD  probenecid (BENEMID) 500 MG tablet Take 1 tablet (500 mg total) by mouth daily. 03/11/18   Wendie Agreste, MD   Social History   Socioeconomic History  . Marital status: Married    Spouse name:  Not on file  . Number of children: Not on file  . Years of education: Not on file  . Highest education level: Not on file  Occupational History  . Occupation: Landscaper  Social Needs  . Financial resource strain: Not on file  . Food insecurity:    Worry: Not on file    Inability: Not on file  . Transportation needs:    Medical: Not on file    Non-medical: Not on file  Tobacco Use  . Smoking status: Never Smoker  . Smokeless tobacco: Never Used  Substance and Sexual Activity  . Alcohol use: Yes    Alcohol/week: 0.0 oz    Comment: few beers  . Drug use: No  . Sexual activity: Yes  Lifestyle  . Physical activity:    Days per week: Not on file    Minutes per session: Not on file  . Stress: Not on file  Relationships  . Social connections:    Talks on phone: Not on file    Gets together: Not on file    Attends religious service: Not on file    Active member of club or organization: Not on file    Attends meetings of clubs or organizations: Not on file    Relationship status: Not on file  . Intimate partner violence:    Fear of current or ex partner: Not on file    Emotionally abused: Not on file    Physically abused: Not on file    Forced sexual activity: Not on file  Other Topics Concern  . Not on file  Social History Narrative   Married   Education: College   Exercise: Yes   Review of Systems  Constitutional: Negative for fatigue and unexpected weight change.  Eyes: Negative for visual disturbance.  Respiratory: Negative for cough, chest tightness and shortness of breath.   Cardiovascular: Negative for chest pain, palpitations and leg swelling.  Gastrointestinal: Negative for abdominal pain and Short in stool.  Musculoskeletal: Positive for back pain. Negative for arthralgias, gait problem and myalgias.  Skin: Negative for rash.  Neurological: Negative for dizziness, weakness, light-headedness and headaches.       Objective:   Physical Exam  Constitutional:  He is oriented to person, place, and time. He appears well-developed and well-nourished. No distress.  HENT:  Head: Normocephalic and atraumatic.  Eyes: Pupils are equal, round, and reactive to light. EOM are normal.  Neck: Neck supple.  Cardiovascular: Normal rate.  Pulmonary/Chest: Effort normal. No respiratory distress.  Musculoskeletal: Normal range of motion.  Back: slight tenderness over right paraspinal para lumbar, same location as before; full flexion, slight discomfort with extension but intact, discomfort with right lateral flexion, left lateral flexion intact, full rotation, equal lower extremities strength  Neurological: He is alert and oriented to person, place, and time.  Reflex Scores:      Patellar reflexes are 2+ on  the right side and 2+ on the left side.      Achilles reflexes are 2+ on the right side and 2+ on the left side. Skin: Skin is warm and dry.  Psychiatric: He has a normal mood and affect. His behavior is normal.  Nursing note and vitals reviewed.   Vitals:   03/25/18 1139  BP: 110/64  Pulse: 83  Temp: 97.8 F (36.6 C)  TempSrc: Oral  SpO2: 98%  Weight: 203 lb 12.8 oz (92.4 kg)  Height: 6' 3"  (1.905 m)       Assessment & Plan:    Brendan Short is a 57 y.o. male Strain of lumbar region, subsequent encounter  Improving low back pain/strain.  No red flags on exam or history, decided to continue prednisone as previously prescribed  - Flexeril refilled, has hydrocodone if needed.  Cautioned on combination.   -  If symptoms are not continuing to improve into next week would consider imaging with x-ray of LS spine initially with potential MRI, as well as potential longer course of prednisone to treat for possible herniated nucleus pulposus. Slow resumption of physical activity and caution with lifting/twisting/turning at work.  RTC precautions if worsening.    No orders of the defined types were placed in this encounter.  Patient Instructions     I'm glad to hear that the back pain is improving on prednisone. Range of motion and gentle stretches are ok, but continue to avoid heavy lifting, twisting and bending until the pain has significantly improved (then return to these activities slowly).   Ok to take flexeril or hydrocodone at night if needed (try not to take these at the same time). If not continuing to improve into next week, then return for xray, possible repeat prednisone and we may also order an MRI at that point. Return to the clinic or go to the nearest emergency room if any of your symptoms worsen or new symptoms occur.  Thanks for coming in today.    Back Pain, Adult Many adults have back pain from time to time. Common causes of back pain include:  A strained muscle or ligament.  Wear and tear (degeneration) of the spinal disks.  Arthritis.  A hit to the back.  Back pain can be Short-lived (acute) or last a long time (chronic). A physical exam, lab tests, and imaging studies may be done to find the cause of your pain. Follow these instructions at home: Managing pain and stiffness  Take over-the-counter and prescription medicines only as told by your health care provider.  If directed, apply heat to the affected area as often as told by your health care provider. Use the heat source that your health care provider recommends, such as a moist heat pack or a heating pad. ? Place a towel between your skin and the heat source. ? Leave the heat on for 20-30 minutes. ? Remove the heat if your skin turns bright red. This is especially important if you are unable to feel pain, heat, or cold. You have a greater risk of getting burned.  If directed, apply ice to the injured area: ? Put ice in a plastic bag. ? Place a towel between your skin and the bag. ? Leave the ice on for 20 minutes, 2-3 times a day for the first 2-3 days. Activity  Do not stay in bed. Resting more than 1-2 days can delay your recovery.  Take  Short walks on even surfaces as soon as you  are able. Try to increase the length of time you walk each day.  Do not sit, drive, or stand in one place for more than 30 minutes at a time. Sitting or standing for long periods of time can put stress on your back.  Use proper lifting techniques. When you bend and lift, use positions that put less stress on your back: ? Carpenter your knees. ? Keep the load close to your body. ? Avoid twisting.  Exercise regularly as told by your health care provider. Exercising will help your back heal faster. This also helps prevent back injuries by keeping muscles strong and flexible.  Your health care provider may recommend that you see a physical therapist. This person can help you come up with a safe exercise program. Do any exercises as told by your physical therapist. Lifestyle  Maintain a healthy weight. Extra weight puts stress on your back and makes it difficult to have good posture.  Avoid activities or situations that make you feel anxious or stressed. Learn ways to manage anxiety and stress. One way to manage stress is through exercise. Stress and anxiety increase muscle tension and can make back pain worse. General instructions  Sleep on a firm mattress in a comfortable position. Try lying on your side with your knees slightly bent. If you lie on your back, put a pillow under your knees.  Follow your treatment plan as told by your health care provider. This may include: ? Cognitive or behavioral therapy. ? Acupuncture or massage therapy. ? Meditation or yoga. Contact a health care provider if:  You have pain that is not relieved with rest or medicine.  You have increasing pain going down into your legs or buttocks.  Your pain does not improve in 2 weeks.  You have pain at night.  You lose weight.  You have a fever or chills. Get help right away if:  You develop new bowel or bladder control problems.  You have unusual weakness or numbness  in your arms or legs.  You develop nausea or vomiting.  You develop abdominal pain.  You feel faint. Summary  Many adults have back pain from time to time. A physical exam, lab tests, and imaging studies may be done to find the cause of your pain.  Use proper lifting techniques. When you bend and lift, use positions that put less stress on your back.  Take over-the-counter and prescription medicines and apply heat or ice as directed by your health care provider. This information is not intended to replace advice given to you by your health care provider. Make sure you discuss any questions you have with your health care provider. Document Released: 10/30/2005 Document Revised: 12/04/2016 Document Reviewed: 12/04/2016 Elsevier Interactive Patient Education  2018 [employer].   IF you received an x-ray today, you will receive an invoice from [facility]. Please contact [facility] at [phone] with questions or concerns regarding your invoice.   IF you received labwork today, you will receive an invoice from [facility]. Please contact [facility] at [phone] with questions or concerns regarding your invoice.   Our billing staff will not be able to assist you with questions regarding bills from these companies.  You will be contacted with the lab results as soon as they are available. The fastest way to get your results is to activate your My Chart account. Instructions are located on the last page of this paperwork. If you have not heard from [place] regarding the results in 2  weeks, please contact this office.       I personally performed the services described in this documentation, which was scribed in my presence. The recorded information has been reviewed and considered for accuracy and completeness, addended by me as needed, and agree with information above.  Signed,   Merri Ray, MD [facility].  03/25/18 2:06  PM

## 2018-04-03 ENCOUNTER — Other Ambulatory Visit: Payer: Self-pay | Admitting: Family Medicine

## 2018-04-03 DIAGNOSIS — Z8739 Personal history of other diseases of the musculoskeletal system and connective tissue: Secondary | ICD-10-CM

## 2018-04-03 DIAGNOSIS — M109 Gout, unspecified: Secondary | ICD-10-CM

## 2018-04-11 ENCOUNTER — Other Ambulatory Visit: Payer: 59

## 2018-04-11 ENCOUNTER — Ambulatory Visit: Payer: 59 | Admitting: Hematology & Oncology

## 2018-04-12 ENCOUNTER — Ambulatory Visit: Payer: 59 | Admitting: Hematology & Oncology

## 2018-04-12 ENCOUNTER — Other Ambulatory Visit: Payer: 59

## 2018-04-12 ENCOUNTER — Other Ambulatory Visit: Payer: Self-pay

## 2018-04-12 ENCOUNTER — Encounter: Payer: Self-pay | Admitting: Family Medicine

## 2018-04-12 ENCOUNTER — Ambulatory Visit (INDEPENDENT_AMBULATORY_CARE_PROVIDER_SITE_OTHER): Payer: 59

## 2018-04-12 ENCOUNTER — Ambulatory Visit: Payer: 59 | Admitting: Family Medicine

## 2018-04-12 VITALS — BP 128/79 | HR 84 | Temp 98.4°F | Resp 18 | Ht 75.32 in | Wt 200.0 lb

## 2018-04-12 DIAGNOSIS — S39012D Strain of muscle, fascia and tendon of lower back, subsequent encounter: Secondary | ICD-10-CM

## 2018-04-12 DIAGNOSIS — M545 Low back pain, unspecified: Secondary | ICD-10-CM

## 2018-04-12 NOTE — Progress Notes (Signed)
Subjective:  By signing my name below, I, Brendan Short, attest that this documentation has been prepared under the direction and in the presence of Merri Ray, MD. Electronically Signed: [name], [place]. 04/12/2018 , 2:34 PM .  Patient was seen in Room 2 .   Patient ID: Brendan Short, male    DOB: 11-Mar-1961, 57 y.o.   MRN: [id number] Chief Complaint  Patient presents with  . Back Pain    f/u   HPI Brendan Short is a 57 y.o. male Here for follow up of low back strain. Patient was most recently seen on May 13th; initially seen on April 29th, reported right sided back pain. He was seen on May 10th, treated with Depo-Medrol, prednisone and hydrocodone as needed for pain, as well as continued on flexeril. He reported not using hydrocodone, and pain was improving. 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 He was able to use a back pack leaf blower without worsening of symptoms. I refilled his flexeril; he was cautioned on returning to physical activity with lifting, twisting and turning at work.   Patient states his right back pain is doing a lot better. After he finished prednisone course, he took a few meloxicam and flexeril at night, and weaned off that. He's doing most things at work, but still being cautious. He denies pain radiating down into his legs. He's not back at 100%, and rates he's currently at 80-85%. He does have home exercises which he does in the mornings from physical therapy. He's a little hesitate to play golf and tennis with his friends. He also mentions applying TENS unit to affected area and cold packs if it's a long day at work.   Patient Active Problem List   Diagnosis Date Noted  . 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 Thrombocytopenia ([place]) 03/14/2016  . Elevated PSA 03/20/2014  . Gout 06/11/2012   Past Medical History:  Diagnosis Date  . Allergy   . 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 Heart murmur   . Thrombocytopenia ([place])  03/14/2016   Past Surgical History:  Procedure Laterality Date  . broken nose  1970  . broken nose surgery, in the early 70"s    . HERNIA REPAIR     Allergies  Allergen Reactions  . Allopurinol    Prior to Admission medications   Medication Sig Start Date End Date Taking? Authorizing Provider  cholecalciferol (VITAMIN D) 1000 UNITS tablet Take 1,000 Units by mouth daily.    [provider]  colchicine (COLCRYS) 0.6 MG tablet TAKE 1 TABLET BY MOUTH EVERY OTHER DAY 03/11/18   Wendie Agreste, MD  cyclobenzaprine (FLEXERIL) 5 MG tablet 1 pill by mouth up to every 8 hours as needed. Start with one pill by mouth each bedtime as needed due to sedation 03/14/18   Wendie Agreste, MD  diclofenac sodium (VOLTAREN) 1 % GEL Apply 1 application topically 2 (two) times daily. 02/03/16   [name], MD  fluticasone (FLONASE) 50 MCG/ACT nasal spray Place 1-2 sprays into both nostrils daily. 02/08/17   Wendie Agreste, MD  HYDROcodone-acetaminophen (NORCO) 5-325 MG tablet Take 1 tablet by mouth every 6 (six) hours as needed for moderate pain. 03/22/18   Horald Pollen, MD  meloxicam (MOBIC) 7.5 MG tablet Take 1 tablet (7.5 mg total) by mouth daily as needed for pain. Patient not taking: Reported on 03/25/2018 03/14/18   Wendie Agreste, MD  probenecid (BENEMID) 500 MG tablet  Take 1 tablet (500 mg total) by mouth daily. 03/11/18   Wendie Agreste, MD   Social History   Socioeconomic History  . Marital status: Married    Spouse name: Not on file  . Number of children: 0  . Years of education: Not on file  . Highest education level: Not on file  Occupational History  . Occupation: Landscaper  Social Needs  . Financial resource strain: Not on file  . Food insecurity:    Worry: Not on file    Inability: Not on file  . Transportation needs:    Medical: Not on file    Non-medical: Not on file  Tobacco Use  . Smoking status: Never Smoker  . Smokeless tobacco: Never Used    Substance and Sexual Activity  . Alcohol use: Yes    Alcohol/week: 0.0 oz    Comment: few beers  . Drug use: No  . Sexual activity: Yes  Lifestyle  . Physical activity:    Days per week: Not on file    Minutes per session: Not on file  . Stress: Not on file  Relationships  . Social connections:    Talks on phone: Not on file    Gets together: Not on file    Attends religious service: Not on file    Active member of club or organization: Not on file    Attends meetings of clubs or organizations: Not on file    Relationship status: Not on file  . Intimate partner violence:    Fear of current or ex partner: Not on file    Emotionally abused: Not on file    Physically abused: Not on file    Forced sexual activity: Not on file  Other Topics Concern  . Not on file  Social History Narrative   Married   Education: College   Exercise: Yes   Review of Systems  Constitutional: Negative for fatigue and unexpected weight change.  Eyes: Negative for visual disturbance.  Respiratory: Negative for cough, chest tightness and shortness of breath.   Cardiovascular: Negative for chest pain, palpitations and leg swelling.  Gastrointestinal: Negative for abdominal pain and Short in stool.  Musculoskeletal: Positive for back pain.  Skin: Negative for rash and wound.  Neurological: Negative for dizziness, light-headedness and headaches.       Objective:   Physical Exam  Constitutional: He is oriented to person, place, and time. He appears well-developed and well-nourished. No distress.  HENT:  Head: Normocephalic and atraumatic.  Eyes: Pupils are equal, round, and reactive to light. EOM are normal.  Neck: Neck supple.  Cardiovascular: Normal rate.  Pulmonary/Chest: Effort normal. No respiratory distress.  Musculoskeletal: Normal range of motion.  Right lumbar spine: full flexion, full extension, slight discomfort with right lateral flexion, rotations intact  Neurological: He is alert  and oriented to person, place, and time.  Skin: Skin is warm and dry.  Psychiatric: He has a normal mood and affect. His behavior is normal.  Nursing note and vitals reviewed.   Vitals:   04/12/18 1400  BP: 128/79  Pulse: 84  Resp: 18  Temp: 98.4 F (36.9 C)  TempSrc: Oral  SpO2: 96%  Weight: 200 lb (90.7 kg)  Height: 6' 3.32" (1.913 m)    Dg Lumbar Spine 2-3 Views  Result Date: 04/12/2018 CLINICAL DATA:  Low back pain, chronic. The patient suffered a strain of the low back by an unspecified mechanism. Question spondylolisthesis. EXAM: LUMBAR SPINE - 2-3 VIEW COMPARISON:  Plain films lumbar spine 09/29/2015. FINDINGS: Vertebral body height and alignment are maintained. There has been progression of degenerative disc disease with worsened loss of disc space height and endplate spurring seen at L2-3 and L3-4. Paraspinous structures demonstrate a 0.4 cm calcification projecting in the lower pole of the left kidney consistent with a nonobstructing stone. IMPRESSION: No acute abnormality. Progressive degenerative disc disease L2-3 and L3-4. 0.4 cm nonobstructing stone lower pole left kidney. Electronically Signed   By: Inge Rise M.D.   On: 04/12/2018 14:47       Assessment & Plan:   Brendan Short is a 57 y.o. male Strain of lumbar region, subsequent encounter - Plan: DG Lumbar Spine 2-3 Views  Acute right-sided low back pain without sciatica - Plan: DG Lumbar Spine 2-3 Views Low back pain/strain.  Improving.  Does have degenerative disc disease noted on x-ray without acute abnormalities or spondylolisthesis.  Continue home exercises as prescribed by physical therapy in the past, has meloxicam or Flexeril if needed.  Slow resumption of activities including lifting as well as handout given on back injury prevention.  RTC precautions if worsening symptoms.   No orders of the defined types were placed in this encounter.  Patient Instructions    I'm glad to hear that the back pain  has improved. Continue to be careful with lifting/twisting, then slowly add in activity over the next few weeks.   Continue the home exercises for your back from physical therapy. If pain is not continuing to improve (or worsens) please return to discuss next step.  Thanks for coming in today.   Back Injury Prevention Back injuries can be very painful. They can also be difficult to heal. After having one back injury, you are more likely to injure your back again. It is important to learn how to avoid injuring or re-injuring your back. The following tips can help you to prevent a back injury. What should I know about physical fitness?  Exercise for 30 minutes per day on most days of the week or as directed by your health care provider. Make sure to: ? Do aerobic exercises, such as walking, jogging, biking, or swimming. ? Do exercises that increase balance and strength, such as tai chi and yoga. These can decrease your risk of falling and injuring your back. ? Do stretching exercises to help with flexibility. ? Try to develop strong abdominal muscles. Your abdominal muscles provide a lot of the support that is needed by your back.  Maintain a healthy weight. This helps to decrease your risk of a back injury. What should I know about my diet?  Talk with your health care provider about your overall diet. Take supplements and vitamins only as directed by your health care provider.  Talk with your health care provider about how much calcium and vitamin D you need each day. These nutrients help to prevent weakening of the bones (osteoporosis). Osteoporosis can cause broken (fractured) bones, which lead to back pain.  Include good sources of calcium in your diet, such as dairy products, green leafy vegetables, and products that have had calcium added to them (fortified).  Include good sources of vitamin D in your diet, such as milk and foods that are fortified with vitamin D. What should I know  about my posture?  Sit up straight and stand up straight. Avoid leaning forward when you sit or hunching over when you stand.  Choose chairs that have good low-back (lumbar) support.  If you work  at a desk, sit close to it so you do not need to lean over. Keep your chin tucked in. Keep your neck drawn back, and keep your elbows bent at a right angle. Your arms should look like the letter "L."  Sit high and close to the steering wheel when you drive. Add a lumbar support to your car seat, if needed.  Avoid sitting or standing in one position for very long. Take breaks to get up, stretch, and walk around at least one time every hour. Take breaks every hour if you are driving for long periods of time.  Sleep on your side with your knees slightly bent, or sleep on your back with a pillow under your knees. Do not lie on the front of your body to sleep. What should I know about lifting, twisting, and reaching? Lifting and Heavy Lifting   Avoid heavy lifting, especially repetitive heavy lifting. If you must do heavy lifting: ? Stretch before lifting. ? Work slowly. ? Rest between lifts. ? Use a tool such as a cart or a dolly to move objects if one is available. ? Make several small trips instead of carrying one heavy load. ? Ask for help when you need it, especially when moving big objects.  Follow these steps when lifting: ? Stand with your feet shoulder-width apart. ? Get as close to the object as you can. Do not try to pick up a heavy object that is far from your body. ? Use handles or lifting straps if they are available. ? Bend at your knees. Squat down, but keep your heels off the floor. ? Keep your shoulders pulled back, your chin tucked in, and your back straight. ? Lift the object slowly while you tighten the muscles in your legs, abdomen, and buttocks. Keep the object as close to the center of your body as possible.  Follow these steps when putting down a heavy load: ? Stand with  your feet shoulder-width apart. ? Lower the object slowly while you tighten the muscles in your legs, abdomen, and buttocks. Keep the object as close to the center of your body as possible. ? Keep your shoulders pulled back, your chin tucked in, and your back straight. ? Bend at your knees. Squat down, but keep your heels off the floor. ? Use handles or lifting straps if they are available. Twisting and Reaching  Avoid lifting heavy objects above your waist.  Do not twist at your waist while you are lifting or carrying a load. If you need to turn, move your feet.  Do not bend over without bending at your knees.  Avoid reaching over your head, across a table, or for an object on a high surface. What are some other tips?  Avoid wet floors and icy ground. Keep sidewalks clear of ice to prevent falls.  Do not sleep on a mattress that is too soft or too hard.  Keep items that are used frequently within easy reach.  Put heavier objects on shelves at waist level, and put lighter objects on lower or higher shelves.  Find ways to decrease your stress, such as exercise, massage, or relaxation techniques. Stress can build up in your muscles. Tense muscles are more vulnerable to injury.  Talk with your health care provider if you feel anxious or depressed. These conditions can make back pain worse.  Wear flat heel shoes with cushioned soles.  Avoid sudden movements.  Use both shoulder straps when carrying a backpack.  Do  not use any tobacco products, including cigarettes, chewing tobacco, or electronic cigarettes. If you need help quitting, ask your health care provider. This information is not intended to replace advice given to you by your health care provider. Make sure you discuss any questions you have with your health care provider. Document Released: 12/07/2004 Document Revised: 04/06/2016 Document Reviewed: 11/03/2014 Elsevier Interactive Patient Education  2018 [employer].    IF you received an x-ray today, you will receive an invoice from [facility]. Please contact [facility] at [phone] with questions or concerns regarding your invoice.   IF you received labwork today, you will receive an invoice from [facility]. Please contact [facility] at [phone] with questions or concerns regarding your invoice.   Our billing staff will not be able to assist you with questions regarding bills from these companies.  You will be contacted with the lab results as soon as they are available. The fastest way to get your results is to activate your My Chart account. Instructions are located on the last page of this paperwork. If you have not heard from [place] regarding the results in 2 weeks, please contact this office.      I personally performed the services described in this documentation, which was scribed in my presence. The recorded information has been reviewed and considered for accuracy and completeness, addended by me as needed, and agree with information above.  Signed,   Merri Ray, MD [facility].  04/12/18 3:32 PM

## 2018-04-12 NOTE — Patient Instructions (Addendum)
I'm glad to hear that the back pain has improved. Continue to be careful with lifting/twisting, then slowly add in activity over the next few weeks.   Continue the home exercises for your back from physical therapy. If pain is not continuing to improve (or worsens) please return to discuss next step.  Thanks for coming in today.   Back Injury Prevention Back injuries can be very painful. They can also be difficult to heal. After having one back injury, you are more likely to injure your back again. It is important to learn how to avoid injuring or re-injuring your back. The following tips can help you to prevent a back injury. What should I know about physical fitness?  Exercise for 30 minutes per day on most days of the week or as directed by your health care provider. Make sure to: ? Do aerobic exercises, such as walking, jogging, biking, or swimming. ? Do exercises that increase balance and strength, such as tai chi and yoga. These can decrease your risk of falling and injuring your back. ? Do stretching exercises to help with flexibility. ? Try to develop strong abdominal muscles. Your abdominal muscles provide a lot of the support that is needed by your back.  Maintain a healthy weight. This helps to decrease your risk of a back injury. What should I know about my diet?  Talk with your health care provider about your overall diet. Take supplements and vitamins only as directed by your health care provider.  Talk with your health care provider about how much calcium and vitamin D you need each day. These nutrients help to prevent weakening of the bones (osteoporosis). Osteoporosis can cause broken (fractured) bones, which lead to back pain.  Include good sources of calcium in your diet, such as dairy products, green leafy vegetables, and products that have had calcium added to them (fortified).  Include good sources of vitamin D in your diet, such as milk and foods that are fortified  with vitamin D. What should I know about my posture?  Sit up straight and stand up straight. Avoid leaning forward when you sit or hunching over when you stand.  Choose chairs that have good low-back (lumbar) support.  If you work at a desk, sit close to it so you do not need to lean over. Keep your chin tucked in. Keep your neck drawn back, and keep your elbows bent at a right angle. Your arms should look like the letter "L."  Sit high and close to the steering wheel when you drive. Add a lumbar support to your car seat, if needed.  Avoid sitting or standing in one position for very long. Take breaks to get up, stretch, and walk around at least one time every hour. Take breaks every hour if you are driving for long periods of time.  Sleep on your side with your knees slightly bent, or sleep on your back with a pillow under your knees. Do not lie on the front of your body to sleep. What should I know about lifting, twisting, and reaching? Lifting and Heavy Lifting   Avoid heavy lifting, especially repetitive heavy lifting. If you must do heavy lifting: ? Stretch before lifting. ? Work slowly. ? Rest between lifts. ? Use a tool such as a cart or a dolly to move objects if one is available. ? Make several small trips instead of carrying one heavy load. ? Ask for help when you need it, especially when moving big objects.  Follow these steps when lifting: ? Stand with your feet shoulder-width apart. ? Get as close to the object as you can. Do not try to pick up a heavy object that is far from your body. ? Use handles or lifting straps if they are available. ? Bend at your knees. Squat down, but keep your heels off the floor. ? Keep your shoulders pulled back, your chin tucked in, and your back straight. ? Lift the object slowly while you tighten the muscles in your legs, abdomen, and buttocks. Keep the object as close to the center of your body as possible.  Follow these steps when  putting down a heavy load: ? Stand with your feet shoulder-width apart. ? Lower the object slowly while you tighten the muscles in your legs, abdomen, and buttocks. Keep the object as close to the center of your body as possible. ? Keep your shoulders pulled back, your chin tucked in, and your back straight. ? Bend at your knees. Squat down, but keep your heels off the floor. ? Use handles or lifting straps if they are available. Twisting and Reaching  Avoid lifting heavy objects above your waist.  Do not twist at your waist while you are lifting or carrying a load. If you need to turn, move your feet.  Do not bend over without bending at your knees.  Avoid reaching over your head, across a table, or for an object on a high surface. What are some other tips?  Avoid wet floors and icy ground. Keep sidewalks clear of ice to prevent falls.  Do not sleep on a mattress that is too soft or too hard.  Keep items that are used frequently within easy reach.  Put heavier objects on shelves at waist level, and put lighter objects on lower or higher shelves.  Find ways to decrease your stress, such as exercise, massage, or relaxation techniques. Stress can build up in your muscles. Tense muscles are more vulnerable to injury.  Talk with your health care provider if you feel anxious or depressed. These conditions can make back pain worse.  Wear flat heel shoes with cushioned soles.  Avoid sudden movements.  Use both shoulder straps when carrying a backpack.  Do not use any tobacco products, including cigarettes, chewing tobacco, or electronic cigarettes. If you need help quitting, ask your health care provider. This information is not intended to replace advice given to you by your health care provider. Make sure you discuss any questions you have with your health care provider. Document Released: 12/07/2004 Document Revised: 04/06/2016 Document Reviewed: 11/03/2014 Elsevier Interactive  Patient Education  2018 [employer].    IF you received an x-ray today, you will receive an invoice from [facility]. Please contact [facility] at [phone] with questions or concerns regarding your invoice.   IF you received labwork today, you will receive an invoice from [facility]. Please contact [facility] at [phone] with questions or concerns regarding your invoice.   Our billing staff will not be able to assist you with questions regarding bills from these companies.  You will be contacted with the lab results as soon as they are available. The fastest way to get your results is to activate your My Chart account. Instructions are located on the last page of this paperwork. If you have not heard from [place] regarding the results in 2 weeks, please contact this office.

## 2018-04-19 ENCOUNTER — Inpatient Hospital Stay: Payer: 59 | Attending: Hematology & Oncology

## 2018-04-19 ENCOUNTER — Other Ambulatory Visit: Payer: Self-pay

## 2018-04-19 ENCOUNTER — Inpatient Hospital Stay (HOSPITAL_BASED_OUTPATIENT_CLINIC_OR_DEPARTMENT_OTHER): Payer: 59 | Admitting: Hematology & Oncology

## 2018-04-19 ENCOUNTER — Encounter: Payer: Self-pay | Admitting: Hematology & Oncology

## 2018-04-19 VITALS — BP 121/90 | HR 58 | Temp 98.2°F | Resp 18 | Wt 199.0 lb

## 2018-04-19 DIAGNOSIS — D696 Thrombocytopenia, unspecified: Secondary | ICD-10-CM

## 2018-04-19 DIAGNOSIS — K7581 Nonalcoholic steatohepatitis (NASH): Secondary | ICD-10-CM | POA: Insufficient documentation

## 2018-04-19 DIAGNOSIS — Z79899 Other long term (current) drug therapy: Secondary | ICD-10-CM

## 2018-04-19 DIAGNOSIS — M109 Gout, unspecified: Secondary | ICD-10-CM

## 2018-04-19 LAB — CBC WITH DIFFERENTIAL (CANCER CENTER ONLY)
BASOS ABS: 0 10*3/uL (ref 0.0–0.1)
Basophils Relative: 1 %
Eosinophils Absolute: 0.1 10*3/uL (ref 0.0–0.5)
Eosinophils Relative: 2 %
HCT: 41.4 % (ref 38.7–49.9)
Hemoglobin: 14.3 g/dL (ref 13.0–17.1)
LYMPHS PCT: 24 %
Lymphs Abs: 0.9 10*3/uL (ref 0.9–3.3)
MCH: 34.3 pg — ABNORMAL HIGH (ref 28.0–33.4)
MCHC: 34.5 g/dL (ref 32.0–35.9)
MCV: 99.3 fL — AB (ref 82.0–98.0)
MONO ABS: 0.4 10*3/uL (ref 0.1–0.9)
Monocytes Relative: 10 %
Neutro Abs: 2.4 10*3/uL (ref 1.5–6.5)
Neutrophils Relative %: 63 %
Platelet Count: 134 10*3/uL — ABNORMAL LOW (ref 145–400)
RBC: 4.17 MIL/uL — AB (ref 4.20–5.70)
RDW: 12.3 % (ref 11.1–15.7)
WBC Count: 3.8 10*3/uL — ABNORMAL LOW (ref 4.0–10.0)

## 2018-04-19 LAB — SAVE SMEAR

## 2018-04-19 NOTE — Progress Notes (Signed)
Hematology and Oncology Follow Up Visit  Brendan Short [id number] 02-24-1961 57 y.o. 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  Everything is going quite well.  He is still working.  He works for the [employer].  He works in [employer].  He unfortunately got into a bees nest earlier this week.  He got stung quite a bit.  He and his family are planning to go over to [place] next year.  He is looking forward to this.  He has had some problems with gout.  He is on a couple gout medications.  He is had no bleeding.  He has had no fever.  There is been no cough or shortness of breath.  He is trying to stay active.  He gets a lot of activity with his work.  ECOG Performance Status: 0 - Asymptomatic  Medications:  Allergies as of 04/19/2018      Reactions   Allopurinol       Medication List        Accurate as of 04/19/18  8:09 AM. Always use your most recent med list.          cholecalciferol 1000 units tablet Commonly known as:  VITAMIN D Take 1,000 Units by mouth daily.   colchicine 0.6 MG tablet Commonly known as:  COLCRYS TAKE 1 TABLET BY MOUTH EVERY OTHER DAY   cyclobenzaprine 5 MG tablet Commonly known as:  FLEXERIL 1 pill by mouth up to every 8 hours as needed. Start with one pill by mouth each bedtime as needed due to sedation   diclofenac sodium 1 % Gel Commonly known as:  VOLTAREN Apply 1 application topically 2 (two) times daily.   fluticasone 50 MCG/ACT nasal spray Commonly known as:  FLONASE Place 1-2 sprays into both nostrils daily.   HYDROcodone-acetaminophen 5-325 MG tablet Commonly known as:  NORCO Take 1 tablet by mouth every 6 (six) hours as needed for moderate pain.   meloxicam 7.5 MG tablet Commonly known as:  MOBIC Take 1 tablet (7.5 mg total) by mouth daily as needed for pain.   probenecid 500  MG tablet Commonly known as:  BENEMID Take 1 tablet (500 mg total) by mouth daily.       Allergies:  Allergies  Allergen Reactions  . Allopurinol     Past Medical History, Surgical history, Social history, and Family History were reviewed and updated.  Review of Systems: Review of Systems  Constitutional: Negative.   HENT: Negative.   Eyes: Negative.   Respiratory: Negative.   Cardiovascular: Negative.   Gastrointestinal: Negative.   Genitourinary: Negative.   Musculoskeletal: Negative.   Skin: Negative.   Neurological: Negative.   Endo/Heme/Allergies: Negative.   Psychiatric/Behavioral: Negative.      Physical Exam:  vitals were not taken for this visit.   Wt Readings from Last 3 Encounters:  04/12/18 200 lb (90.7 kg)  03/25/18 203 lb 12.8 oz (92.4 kg)  03/22/18 206 lb (93.4 kg)    Physical Exam  Constitutional: He is oriented to person, place, and time.  HENT:  Head: Normocephalic and atraumatic.  Mouth/Throat: Oropharynx is clear and moist.  Eyes: Pupils are equal, round, and reactive to light. EOM are normal.  Neck: Normal range of motion.  Cardiovascular: Normal rate, regular rhythm and normal heart sounds.  Pulmonary/Chest: Effort normal and breath sounds normal.  Abdominal: Soft.  Bowel sounds are normal.  Musculoskeletal: Normal range of motion. He exhibits no edema, tenderness or deformity.  Lymphadenopathy:    He has no cervical adenopathy.  Neurological: He is alert and oriented to person, place, and time.  Skin: Skin is warm and dry. No rash noted. No erythema.  Psychiatric: He has a normal mood and affect. His behavior is normal. Judgment and thought content normal.  Vitals reviewed.    Lab Results  Component Value Date   WBC 4.0 10/12/2017   HGB 14.7 10/12/2017   HCT 41.5 10/12/2017   MCV 98 10/12/2017   PLT 115 Platelet count consistent in citrate (L) 10/12/2017   No results found for: FERRITIN, IRON, TIBC, UIBC, IRONPCTSAT Lab  Results  Component Value Date   RBC 4.22 10/12/2017   No results found for: KPAFRELGTCHN, LAMBDASER, KAPLAMBRATIO No results found for: IGGSERUM, IGA, IGMSERUM No results found for: Odetta Pink, SPEI   Chemistry      Component Value Date/Time   NA 140 03/11/2018 1157   K 4.4 03/11/2018 1157   CL 103 03/11/2018 1157   CO2 23 03/11/2018 1157   BUN 18 03/11/2018 1157   CREATININE 1.00 03/11/2018 1157   CREATININE 0.97 02/03/2016 1446      Component Value Date/Time   CALCIUM 9.1 03/11/2018 1157   ALKPHOS 50 03/11/2018 1157   AST 21 03/11/2018 1157   ALT 20 03/11/2018 1157   BILITOT 0.4 03/11/2018 1157      Impression and Plan: Brendan Short is a very pleasant 57 yo caucasian male with history of thombocytopenia secondary to NASH.   His platelet count looks great.  We have really had no problems with thrombocytopenia.  It has been mild at best.  I think we can get him back now in 1 year.  I does do not see that we are really doing all that much for him right now.  I think a yearly follow-up would be reasonable.      Volanda Napoleon, MD 6/7/20198:09 AM

## 2018-04-24 LAB — PNH PROFILE (-HIGH SENSITIVITY)

## 2018-05-18 ENCOUNTER — Telehealth: Payer: Self-pay | Admitting: Family Medicine

## 2018-05-18 DIAGNOSIS — M5136 Other intervertebral disc degeneration, lumbar region: Secondary | ICD-10-CM

## 2018-05-18 NOTE — Telephone Encounter (Signed)
Patient would like to get an MRI because he is still having issues with his back issues he doesn't want to come in to be seen since this is something that has been going on and Dr Carlota Raspberry has treated him with everything in the past.  Wants to know if he can get a referral placed for this.  His call back number is [phone]

## 2018-05-19 NOTE — Telephone Encounter (Signed)
I can potentially order that but need more information.  Is he having increasing pain, which side is bothering him, and is he having any radiating pain down the legs?  That helps to provide more information on ordering an MRI.  Another option is meeting with a back specialist/orthopedics and am happy to place that referral as well. Let me know.

## 2018-05-20 NOTE — Telephone Encounter (Signed)
Pt called and states the pain is on the right the side, feels like a sciatic nerve, pt will look for a specialist and will contact back and give information on who he finds to set up a referral, contact pt if needed

## 2018-05-20 NOTE — Telephone Encounter (Signed)
Left message in voice mail of mobile number to call back. Dr Carlota Raspberry needs to know more information. Are you having increasing pain that is bothering you, which side and are you having any radiating pain down the legs?. This will help him with ordering the MRI. Also, another option is meeting with a back specialist/orthopedics and he will be happy to make a referral. Let him know your option.

## 2018-05-23 NOTE — Telephone Encounter (Signed)
I ordered an MRI of his back.  As far as specialists, I have referred to back specialist Dr. Lynann Bologna in the past but also other providers. If he has a preference, I can refer him wherever he would like. We can also see results of MRI first to determine next step. Let me know if there are questions.

## 2018-05-27 NOTE — Telephone Encounter (Signed)
Message per Dr. Carlota Raspberry sent to  [facility] to ensure someone calls pt back.

## 2018-06-01 ENCOUNTER — Ambulatory Visit
Admission: RE | Admit: 2018-06-01 | Discharge: 2018-06-01 | Disposition: A | Discharge status: Home / Self Care | Payer: 59 | Source: Ambulatory Visit | Attending: Family Medicine | Admitting: Family Medicine

## 2018-06-01 DIAGNOSIS — M5136 Other intervertebral disc degeneration, lumbar region: Secondary | ICD-10-CM

## 2018-06-01 DIAGNOSIS — M51369 Other intervertebral disc degeneration, lumbar region without mention of lumbar back pain or lower extremity pain: Secondary | ICD-10-CM

## 2018-06-01 DIAGNOSIS — M545 Low back pain: Secondary | ICD-10-CM | POA: Diagnosis not present

## 2018-06-05 NOTE — Telephone Encounter (Signed)
MRI has been completed.

## 2018-06-07 ENCOUNTER — Telehealth: Payer: Self-pay | Admitting: Family Medicine

## 2018-06-07 NOTE — Telephone Encounter (Signed)
Copied from [place] [id number]. Topic: Quick Communication - See Telephone Encounter >> Jun 07, 2018  7:56 AM Hewitt Shorts wrote: Pt is wanting to inform Dr. Carlota Raspberry that he has already scheduled himself an appt with [facility] on [address] -Dr. Ronnald Ramp in regards to the results of his xrays  Showing disc disease his appt is 06/10/18   Best number [phone]

## 2018-06-10 DIAGNOSIS — M5126 Other intervertebral disc displacement, lumbar region: Secondary | ICD-10-CM | POA: Diagnosis not present

## 2018-06-10 DIAGNOSIS — R030 Elevated blood-pressure reading, without diagnosis of hypertension: Secondary | ICD-10-CM | POA: Diagnosis not present

## 2018-08-30 DIAGNOSIS — N400 Enlarged prostate without lower urinary tract symptoms: Secondary | ICD-10-CM | POA: Diagnosis not present

## 2018-09-09 DIAGNOSIS — N400 Enlarged prostate without lower urinary tract symptoms: Secondary | ICD-10-CM | POA: Diagnosis not present

## 2018-09-09 DIAGNOSIS — R972 Elevated prostate specific antigen [PSA]: Secondary | ICD-10-CM | POA: Diagnosis not present

## 2018-12-15 DIAGNOSIS — M7020 Olecranon bursitis, unspecified elbow: Secondary | ICD-10-CM | POA: Diagnosis not present

## 2018-12-19 ENCOUNTER — Encounter: Payer: Self-pay | Admitting: Family Medicine

## 2018-12-19 ENCOUNTER — Other Ambulatory Visit: Payer: Self-pay

## 2018-12-19 ENCOUNTER — Ambulatory Visit: Payer: 59 | Admitting: Family Medicine

## 2018-12-19 ENCOUNTER — Ambulatory Visit (INDEPENDENT_AMBULATORY_CARE_PROVIDER_SITE_OTHER): Payer: 59

## 2018-12-19 VITALS — BP 133/89 | HR 83 | Temp 98.4°F | Ht 75.0 in | Wt 213.2 lb

## 2018-12-19 DIAGNOSIS — M7989 Other specified soft tissue disorders: Secondary | ICD-10-CM | POA: Diagnosis not present

## 2018-12-19 DIAGNOSIS — M1A0210 Idiopathic chronic gout, right elbow, without tophus (tophi): Secondary | ICD-10-CM | POA: Diagnosis not present

## 2018-12-19 DIAGNOSIS — Z09 Encounter for follow-up examination after completed treatment for conditions other than malignant neoplasm: Secondary | ICD-10-CM

## 2018-12-19 DIAGNOSIS — M25521 Pain in right elbow: Secondary | ICD-10-CM | POA: Diagnosis not present

## 2018-12-19 DIAGNOSIS — S59901A Unspecified injury of right elbow, initial encounter: Secondary | ICD-10-CM | POA: Diagnosis not present

## 2018-12-19 MED ORDER — INDOMETHACIN 25 MG PO CAPS: 25.0000 mg | ORAL_CAPSULE | Freq: Two times a day (BID) | ORAL | 2 refills | Status: DC

## 2018-12-19 NOTE — Patient Instructions (Addendum)
If you have lab work done today you will be contacted with your lab results within the next 2 weeks.  If you have not heard from [place] then please contact us. The fastest way to get your results is to register for My Chart.   IF you received an x-ray today, you will receive an invoice from [facility]. Please contact [facility] at [phone] with questions or concerns regarding your invoice.   IF you received labwork today, you will receive an invoice from [facility]. Please contact [facility] at [phone] with questions or concerns regarding your invoice.   Our billing staff will not be able to assist you with questions regarding bills from these companies.  You will be contacted with the lab results as soon as they are available. The fastest way to get your results is to activate your My Chart account. Instructions are located on the last page of this paperwork. If you have not heard from [place] regarding the results in 2 weeks, please contact this office.      Gout  Gout is painful swelling of your joints. Gout is a type of arthritis. It is caused by having too much uric acid in your body. Uric acid is a chemical that is made when your body breaks down substances called purines. If your body has too much uric acid, sharp crystals can form and build up in your joints. This causes pain and swelling. Gout attacks can happen quickly and be very painful (acute gout). Over time, the attacks can affect more joints and happen more often (chronic gout). What are the causes?  Too much uric acid in your blood. This can happen because: ? Your kidneys do not remove enough uric acid from your blood. ? Your body makes too much uric acid. ? You eat too many foods that are high in purines. These foods include organ meats, some seafood, and beer.  Trauma or stress. What increases the risk?  Having a family history of gout.  Being male and middle-aged.  Being male and having  gone through menopause.  Being very overweight (obese).  Drinking alcohol, especially beer.  Not having enough water in the body (being dehydrated).  Losing weight too quickly.  Having an organ transplant.  Having lead poisoning.  Taking certain medicines.  Having kidney disease.  Having a skin condition called psoriasis. What are the signs or symptoms? An attack of acute gout usually happens in just one joint. The most common place is the big toe. Attacks often start at night. Other joints that may be affected include joints of the feet, ankle, knee, fingers, wrist, or elbow. Symptoms of an attack may include:  Very bad pain.  Warmth.  Swelling.  Stiffness.  Shiny, red, or purple skin.  Tenderness. The affected joint may be very painful to touch.  Chills and fever. Chronic gout may cause symptoms more often. More joints may be involved. You may also have white or yellow lumps (tophi) on your hands or feet or in other areas near your joints. How is this treated?  Treatment for this condition has two phases: treating an acute attack and preventing future attacks.  Acute gout treatment may include: ? NSAIDs. ? Steroids. These are taken by mouth or injected into a joint. ? Colchicine. This medicine relieves pain and swelling. It can be given by mouth or through an IV tube.  Preventive treatment may include: ? Taking small doses of NSAIDs or colchicine daily. ? Using  a medicine that reduces uric acid levels in your blood. ? Making changes to your diet. You may need to see a food expert (dietitian) about what to eat and drink to prevent gout. Follow these instructions at home: During a gout attack   If told, put ice on the painful area: ? Put ice in a plastic bag. ? Place a towel between your skin and the bag. ? Leave the ice on for 20 minutes, 2-3 times a day.  Raise (elevate) the painful joint above the level of your heart as often as you can.  Rest the joint  as much as possible. If the joint is in your leg, you may be given crutches.  Follow instructions from your doctor about what you cannot eat or drink. Avoiding future gout attacks  Eat a low-purine diet. Avoid foods and drinks such as: ? Liver. ? Kidney. ? Anchovies. ? Asparagus. ? Herring. ? Mushrooms. ? Mussels. ? Beer.  Stay at a healthy weight. If you want to lose weight, talk with your doctor. Do not lose weight too fast.  Start or continue an exercise plan as told by your doctor. Eating and drinking  Drink enough fluids to keep your pee (urine) pale yellow.  If you drink alcohol: ? Limit how much you use to:  0-1 drink a day for women.  0-2 drinks a day for men. ? Be aware of how much alcohol is in your drink. In the [place] one drink equals one 12 oz bottle of beer (355 mL), one 5 oz glass of wine (148 mL), or one 1 oz glass of hard liquor (44 mL). General instructions  Take over-the-counter and prescription medicines only as told by your doctor.  Do not drive or use heavy machinery while taking prescription pain medicine.  Return to your normal activities as told by your doctor. Ask your doctor what activities are safe for you.  Keep all follow-up visits as told by your doctor. This is important. Contact a doctor if:  You have another gout attack.  You still have symptoms of a gout attack after 10 days of treatment.  You have problems (side effects) because of your medicines.  You have chills or a fever.  You have burning pain when you pee (urinate).  You have pain in your lower back or belly. Get help right away if:  You have very bad pain.  Your pain cannot be controlled.  You cannot pee. Summary  Gout is painful swelling of the joints.  The most common site of pain is the big toe, but it can affect other joints.  Medicines and avoiding some foods can help to prevent and treat gout attacks. This information is not intended to replace advice  given to you by your health care provider. Make sure you discuss any questions you have with your health care provider. Document Released: 08/08/2008 Document Revised: 05/22/2018 Document Reviewed: 05/22/2018 Elsevier Interactive Patient Education  2019 Else[employer] Indomethacin capsules What is this medicine? INDOMETHACIN (in doe METH a sin) is a non-steroidal anti-inflammatory drug (NSAID). It is used to reduce swelling and to treat pain. It may be used for painful joint and muscular problems such as arthritis, tendinitis, bursitis, and gout. This medicine may be used for other purposes; ask your health care provider or pharmacist if you have questions. COMMON BRAND NAME(S): Indocin, TIVORBEX What should I tell my health care provider before I take this medicine? They need to know if you have any of  these conditions: -asthma, especially aspirin sensitive asthma -coronary artery bypass graft (CABG) surgery within the past 2 weeks -depression -drink more than 3 alcohol containing drinks a day -heart disease or circulation problems like heart failure or leg edema (fluid retention) -high blood pressure -kidney disease -liver disease -Parkinson's disease -seizures -stomach bleeding or ulcers -an unusual or allergic reaction to indomethacin, aspirin, other NSAIDs, other medicines, foods, dyes, or preservatives -pregnant or trying to get pregnant -breast-feeding How should I use this medicine? Take this medicine by mouth with food and with a full glass of water. Follow the directions on the prescription label. Take your medicine at regular intervals. Do not take your medicine more often than directed. Long-term, continuous use may increase the risk of heart attack or stroke. A special MedGuide will be given to you by the pharmacist with each prescription and refill. Be sure to read this information carefully each time. Talk to your pediatrician regarding the use of this medicine in children.  Special care may be needed. While this drug may be prescribed for children as young as 15 years for selected conditions, precautions do apply. Elderly patients over 24 years old may have a stronger reaction and need a smaller dose. Overdosage: If you think you have taken too much of this medicine contact a poison control center or emergency room at once. NOTE: This medicine is only for you. Do not share this medicine with others. What if I miss a dose? If you miss a dose, take it as soon as you can. If it is almost time for your next dose, take only that dose. Do not take double or extra doses. What may interact with this medicine? Do not take this medicine with any of the following medications: -cidofovir -diflunisal -ketorolac -methotrexate -pemetrexed -triamterene This medicine may also interact with the following medications: -alcohol -antacids -aspirin and aspirin-like medicines -cyclosporine -digoxin -diuretics -lithium -medicines for diabetes -medicines for high blood pressure -medicines that affect platelets -medicines that treat or prevent blood clots like warfarin -NSAIDs, medicines for pain and inflammation, like ibuprofen or naproxen -probenecid -steroid medicines like prednisone or cortisone This list may not describe all possible interactions. Give your health care provider a list of all the medicines, herbs, non-prescription drugs, or dietary supplements you use. Also tell them if you smoke, drink alcohol, or use illegal drugs. Some items may interact with your medicine. What should I watch for while using this medicine? Tell your doctor or health care professional if your pain does not get better. Talk to your doctor before taking another medicine for pain. Do not treat yourself. This medicine does not prevent heart attack or stroke. In fact, this medicine may increase the chance of a heart attack or stroke. The chance may increase with longer use of this medicine and  in people who have heart disease. If you take aspirin to prevent heart attack or stroke, talk with your doctor or health care professional. Do not take medicines such as ibuprofen and naproxen with this medicine. Side effects such as stomach upset, nausea, or ulcers may be more likely to occur. Many medicines available without a prescription should not be taken with this medicine. This medicine can cause ulcers and bleeding in the stomach and intestines at any time during treatment. Do not smoke cigarettes or drink alcohol. These increase irritation to your stomach and can make it more susceptible to damage from this medicine. Ulcers and bleeding can happen without warning symptoms and can cause death. You  may get drowsy or dizzy. Do not drive, use machinery, or do anything that needs mental alertness until you know how this medicine affects you. Do not stand or sit up quickly, especially if you are an older patient. This reduces the risk of dizzy or fainting spells. This medicine can cause you to bleed more easily. Try to avoid damage to your teeth and gums when you brush or floss your teeth. What side effects may I notice from receiving this medicine? Side effects that you should report to your doctor or health care professional as soon as possible: -allergic reactions like skin rash, itching or hives, swelling of the face, lips, or tongue -difficulty breathing or wheezing -nausea, vomiting -signs and symptoms of bleeding such as bloody or black, tarry stools; red or dark-brown urine; spitting up blood or brown material that looks like coffee grounds; red spots on the skin; unusual bruising or bleeding from the eye, gums, or nose -signs and symptoms of a blood clot such as changes in vision; chest pain; severe, sudden headache; trouble speaking; sudden numbness or weakness of the face, arm, or leg; trouble walking -unexplained weight gain or swelling -unusually weak or tired -yellowing of eyes or  skin Side effects that usually do not require medical attention (report to your doctor or health care professional if they continue or are bothersome): -diarrhea -dizziness -headache -heartburn This list may not describe all possible side effects. Call your doctor for medical advice about side effects. You may report side effects to FDA at 1-800-FDA-1088. Where should I keep my medicine? Keep out of the reach of children. Store at room temperature between 15 and 30 degrees C (59 and 86 degrees F). Keep container tightly closed. Throw away any unused medicine after the expiration date. NOTE: This sheet is a summary. It may not cover all possible information. If you have questions about this medicine, talk to your doctor, pharmacist, or health care provider.  2019 Elsevier/Gold Standard (2013-03-18 15:28:44)

## 2018-12-19 NOTE — Progress Notes (Signed)
Established Patient Office Visit  Subjective:  Patient ID: Brendan Short, male    DOB: [birth year]  Age: 58 y.o. MRN: [id number]  CC:  Chief Complaint  Patient presents with  . right elbow pain    since last monday 12/09/2018    HPI Brendan Short is a 58 year old male who presents for right elbow pain.   Past Medical History:  Diagnosis Date  . Allergy   . 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 Heart murmur   . Thrombocytopenia ([place]) 03/14/2016   Current Status: Mr. Dubin has been having right elbow pain X week now. After visiting a restaurant on 12/07/2018 and eating a large Asian meal he began to experience mild right elbow pain. That Monday on 12/09/2018, he injured elbow at work. Pain increased. He went to [facility] over the past weeken, where he was diagnosed with Bursitis and placed on a Prednisone taper. he states that he has not gotten any relief from Prednisone. He has a history of Gout and continues to take Benemid and Colcrys as directed. He is also increasing fluids and drinking Cherry Juice.   He denies fevers, chills, fatigue, recent infections, weight loss, and night sweats. He has not had any headaches, visual changes, dizziness, and falls. No chest pain, heart palpitations, cough and shortness of breath reported. No reports of GI problems such as nausea, vomiting, diarrhea, and constipation. Hehas no reports of blood in stools, dysuria and hematuria. No depression or anxiety reported.   Past Surgical History:  Procedure Laterality Date  . broken nose  1970  . broken nose surgery, in the early 70"s    . HERNIA REPAIR      Family History  Problem Relation Age of Onset  . Heart disease Mother   . Cancer Father   . Heart disease Maternal Grandmother     Social History   Socioeconomic History  . Marital status: Married    Spouse name: Not on file  . Number of children: 0  . Years of education: Not on file  . Highest education level: Not on file    Occupational History  . Occupation: Landscaper  Social Needs  . Financial resource strain: Not on file  . Food insecurity:    Worry: Not on file    Inability: Not on file  . Transportation needs:    Medical: Not on file    Non-medical: Not on file  Tobacco Use  . Smoking status: Never Smoker  . Smokeless tobacco: Never Used  Substance and Sexual Activity  . Alcohol use: Yes    Alcohol/week: 0.0 standard drinks    Comment: few beers  . Drug use: No  . Sexual activity: Yes  Lifestyle  . Physical activity:    Days per week: Not on file    Minutes per session: Not on file  . Stress: Not on file  Relationships  . Social connections:    Talks on phone: Not on file    Gets together: Not on file    Attends religious service: Not on file    Active member of club or organization: Not on file    Attends meetings of clubs or organizations: Not on file    Relationship status: Not on file  . Intimate partner violence:    Fear of current or ex partner: Not on file    Emotionally abused: Not on file    Physically abused: Not on file    Forced sexual  activity: Not on file  Other Topics Concern  . Not on file  Social History Narrative   Married   Education: College   Exercise: Yes    Outpatient Medications Prior to Visit  Medication Sig Dispense Refill  . cholecalciferol (VITAMIN D) 1000 UNITS tablet Take 1,000 Units by mouth daily.    . colchicine (COLCRYS) 0.6 MG tablet TAKE 1 TABLET BY MOUTH EVERY OTHER DAY 45 tablet 3  . diclofenac sodium (VOLTAREN) 1 % GEL Apply 1 application topically 2 (two) times daily. 100 g 11  . probenecid (BENEMID) 500 MG tablet Take 1 tablet (500 mg total) by mouth daily. 90 tablet 3  . cyclobenzaprine (FLEXERIL) 5 MG tablet 1 pill by mouth up to every 8 hours as needed. Start with one pill by mouth each bedtime as needed due to sedation 15 tablet 0  . fluticasone (FLONASE) 50 MCG/ACT nasal spray Place 1-2 sprays into both nostrils daily. 16 g 6  .  HYDROcodone-acetaminophen (NORCO) 5-325 MG tablet Take 1 tablet by mouth every 6 (six) hours as needed for moderate pain. 12 tablet 0  . meloxicam (MOBIC) 7.5 MG tablet Take 1 tablet (7.5 mg total) by mouth daily as needed for pain. 30 tablet 0   No facility-administered medications prior to visit.     Allergies  Allergen Reactions  . Allopurinol     ROS Review of Systems  Constitutional: Negative.   HENT: Negative.   Eyes: Negative.   Respiratory: Negative.   Cardiovascular: Negative.   Gastrointestinal: Negative.   Endocrine: Negative.   Genitourinary: Negative.   Musculoskeletal: Positive for joint swelling (right elbow pain ).  Skin: Negative.   Allergic/Immunologic: Negative.   Neurological: Negative.   Hematological: Negative.   Psychiatric/Behavioral: Negative.    Objective:    Physical Exam  Constitutional: He is oriented to person, place, and time. He appears well-developed and well-nourished.  HENT:  Head: Normocephalic and atraumatic.  Eyes: Conjunctivae are normal.  Neck: Normal range of motion. Neck supple.  Cardiovascular: Normal rate, regular rhythm, normal heart sounds and intact distal pulses.  Pulmonary/Chest: Effort normal and breath sounds normal.  Abdominal: Soft. Bowel sounds are normal.  Musculoskeletal: Normal range of motion.     Comments: Decreased ROM in right elbow  Neurological: He is alert and oriented to person, place, and time. He has normal reflexes.  Skin: Skin is warm and dry.  Psychiatric: He has a normal mood and affect. His behavior is normal. Judgment and thought content normal.  Vitals reviewed.   BP 133/89 (BP Location: Right Arm, Patient Position: Sitting, Cuff Size: Normal)   Pulse 83   Temp 98.4 F (36.9 C) (Oral)   Ht 6' 3"  (1.905 m)   Wt 213 lb 3.2 oz (96.7 kg)   SpO2 95%   BMI 26.65 kg/m  Wt Readings from Last 3 Encounters:  12/19/18 213 lb 3.2 oz (96.7 kg)  04/19/18 199 lb (90.3 kg)  04/12/18 200 lb (90.7 kg)       Health Maintenance Due  Topic Date Due  . INFLUENZA VACCINE  06/13/2018    There are no preventive care reminders to display for this patient.  Lab Results  Component Value Date   TSH 3.80 02/03/2016   Lab Results  Component Value Date   WBC 3.8 (L) 04/19/2018   HGB 14.3 04/19/2018   HCT 41.4 04/19/2018   MCV 99.3 (H) 04/19/2018   PLT 134 (L) 04/19/2018   Lab Results  Component Value Date  NA 140 03/11/2018   K 4.4 03/11/2018   CO2 23 03/11/2018   GLUCOSE 121 (H) 03/11/2018   BUN 18 03/11/2018   CREATININE 1.00 03/11/2018   BILITOT 0.4 03/11/2018   ALKPHOS 50 03/11/2018   AST 21 03/11/2018   ALT 20 03/11/2018   PROT 6.7 03/11/2018   ALBUMIN 4.4 03/11/2018   CALCIUM 9.1 03/11/2018   Lab Results  Component Value Date   CHOL 167 03/11/2018   Lab Results  Component Value Date   HDL 40 03/11/2018   Lab Results  Component Value Date   LDLCALC 93 03/11/2018   Lab Results  Component Value Date   TRIG 170 (H) 03/11/2018   Lab Results  Component Value Date   CHOLHDL 4.2 03/11/2018   Lab Results  Component Value Date   HGBA1C 5.4 01/26/2015   Assessment & Plan:   1. Right elbow pain Right elbow x-ray is negative for fractures today.  - DG Elbow Complete Right; Future - indomethacin (INDOCIN) 25 MG capsule; Take 1 capsule (25 mg total) by mouth 2 (two) times daily with a meal.  Dispense: 60 capsule; Refill: 2  2. Chronic gout of right elbow, unspecified cause Will will initiate Indomethacin today.  - indomethacin (INDOCIN) 25 MG capsule; Take 1 capsule (25 mg total) by mouth 2 (two) times daily with a meal.  Dispense: 60 capsule; Refill: 2  3. Follow up He will keep previously scheduled follow up appointment with Dr. Nyoka Cowden on 02/2019.  Meds ordered this encounter  Medications  . indomethacin (INDOCIN) 25 MG capsule    Sig: Take 1 capsule (25 mg total) by mouth 2 (two) times daily with a meal.    Dispense:  60 capsule    Refill:  2     Kathe Becton,  MSN, FNP-C [facility] [place], [place] [address] [phone]   Problem List Items Addressed This Visit      Other   Gout   Relevant Medications   indomethacin (INDOCIN) 25 MG capsule    Other Visit Diagnoses    Right elbow pain    -  Primary   Relevant Medications   indomethacin (INDOCIN) 25 MG capsule   Other Relevant Orders   DG Elbow Complete Right (Completed)   Follow up          Meds ordered this encounter  Medications  . indomethacin (INDOCIN) 25 MG capsule    Sig: Take 1 capsule (25 mg total) by mouth 2 (two) times daily with a meal.    Dispense:  60 capsule    Refill:  2    Follow-up: No follow-ups on file.    Azzie Glatter, FNP

## 2018-12-21 NOTE — Progress Notes (Unsigned)
I have called pt and relayed the xray results and he stated he was able to sleep better the last couple of days.

## 2019-01-07 ENCOUNTER — Telehealth: Payer: Self-pay | Admitting: Family Medicine

## 2019-01-07 NOTE — Telephone Encounter (Signed)
LVM for pt regarding their appt with Dr. Carlota Raspberry on 4/30. Due to Dr. Carlota Raspberry being out of the office, pt will need to be rescheduled. When pt calls back, please reschedule at their convenience. Thank you!

## 2019-01-20 ENCOUNTER — Other Ambulatory Visit: Payer: Self-pay

## 2019-01-20 ENCOUNTER — Ambulatory Visit: Payer: 59 | Admitting: Family Medicine

## 2019-01-20 ENCOUNTER — Ambulatory Visit (INDEPENDENT_AMBULATORY_CARE_PROVIDER_SITE_OTHER): Payer: 59

## 2019-01-20 ENCOUNTER — Encounter: Payer: Self-pay | Admitting: Family Medicine

## 2019-01-20 VITALS — BP 140/80 | HR 63 | Temp 98.3°F | Resp 14 | Ht 75.0 in | Wt 208.8 lb

## 2019-01-20 DIAGNOSIS — M25561 Pain in right knee: Secondary | ICD-10-CM | POA: Diagnosis not present

## 2019-01-20 DIAGNOSIS — M25461 Effusion, right knee: Secondary | ICD-10-CM | POA: Diagnosis not present

## 2019-01-20 NOTE — Progress Notes (Signed)
Subjective:    Patient ID: Brendan Short, male    DOB: 11-21-1960, 58 y.o.   MRN: [id number]  HPI Brendan Short is a 58 y.o. male Presents today for: Chief Complaint  Patient presents with  . Knee Pain    knee pain on the inner right knee area without known injury x 1 month. Hurts with certain movement   R knee pain: Medial knee pain past 1 month.  Sitting/walking ok, unless wearing backpack blower or activity such as tennis and running. Some soreness with this activity. NKI. No swelling, no locking/giving way.  Sore with stretching (terminal knee flexion) or repetitive squatting at work.  Tx: ice few times after tennis. Few episodes ibuprofen.  Hx of gout. Has used probenecid 581m QD, colchicine QOD.   Patient Active Problem List   Diagnosis Date Noted  . Right elbow pain 12/19/2018  . Drug-induced leukopenia ([place] 03/14/2016  . [place]ocyt[place] 03/14/2016  . Elevated PSA 03/20/2014  . Gout 06/11/2012   Past Medical History:  Diagnosis Date  . Allergy   . Drug-induced leukopenia ([facility] 03/14/2016  . Heart murmur   . [place] 03/14/2016   Past Surgical History:  Procedure Laterality Date  . broken nose  1970  . broken nose surgery, in the early 70"s    . HERNIA REPAIR     Allergies  Allergen Reactions  . Allopurinol    Prior to Admission medications   Medication Sig Start Date End Date Taking? Authorizing Provider  cholecalciferol (VITAMIN D) 1000 UNITS tablet Take 1,000 Units by mouth daily.   Yes [provider]  colchicine (COLCRYS) 0.6 MG tablet TAKE 1 TABLET BY MOUTH EVERY OTHER DAY 03/11/18  Yes GWendie Agreste MD  probenecid (BENEMID) 500 MG tablet Take 1 tablet (500 mg total) by mouth daily. 03/11/18  Yes GWendie Agreste MD  diclofenac sodium (VOLTAREN) 1 % GEL Apply 1 application topically 2 (two) times daily. Patient not taking: Reported on 01/20/2019 02/03/16   DDarlyne Russian MD   Social History   Socioeconomic History  .  Marital status: Married    Spouse name: Not on file  . Number of children: 0  . Years of education: Not on file  . Highest education level: Not on file  Occupational History  . Occupation: Landscaper  Social Needs  . Financial resource strain: Not on file  . Food insecurity:    Worry: Not on file    Inability: Not on file  . Transportation needs:    Medical: Not on file    Non-medical: Not on file  Tobacco Use  . Smoking status: Never Smoker  . Smokeless tobacco: Never Used  Substance and Sexual Activity  . Alcohol use: Yes    Alcohol/week: 0.0 standard drinks    Comment: few beers  . Drug use: No  . Sexual activity: Yes  Lifestyle  . Physical activity:    Days per week: Not on file    Minutes per session: Not on file  . Stress: Not on file  Relationships  . Social connections:    Talks on phone: Not on file    Gets together: Not on file    Attends religious service: Not on file    Active member of club or organization: Not on file    Attends meetings of clubs or organizations: Not on file    Relationship status: Not on file  . Intimate partner violence:    Fear of current  or ex partner: Not on file    Emotionally abused: Not on file    Physically abused: Not on file    Forced sexual activity: Not on file  Other Topics Concern  . Not on file  Social History Narrative   Married   Education: College   Exercise: Yes    Review of Systems Per HPI.     Objective:   Physical Exam Vitals signs reviewed.  Constitutional:      General: He is not in acute distress.    Appearance: He is well-developed.  HENT:     Head: Normocephalic and atraumatic.  Cardiovascular:     Rate and Rhythm: Normal rate.  Pulmonary:     Effort: Pulmonary effort is normal.  Musculoskeletal:     Right knee: He exhibits normal range of motion, no swelling, no effusion, no deformity, normal alignment, no LCL laxity, normal patellar mobility, normal meniscus and no MCL laxity. Tenderness  found. Medial joint line (minimal at med jt line. neg lachman, mcmurray. ) tenderness noted.  Neurological:     Mental Status: He is alert and oriented to person, place, and time.    Vitals:   01/20/19 0848  BP: 140/80  Pulse: 63  Resp: 14  Temp: 98.3 F (36.8 C)  TempSrc: Oral  SpO2: 97%  Weight: 208 lb 12.8 oz (94.7 kg)  Height: 6' 3"  (1.905 m)   Dg Knee Complete 4 Views Right  Result Date: 01/20/2019 CLINICAL DATA:  Right medial knee pain EXAM: RIGHT KNEE - COMPLETE 4+ VIEW COMPARISON:  None. FINDINGS: No evidence of fracture, or dislocation. Small suprapatellar joint effusion. No evidence of arthropathy or other focal bone abnormality. Soft tissues are unremarkable. IMPRESSION: 1. No acute fracture or dislocation identified about the right knee. 2. Small suprapatellar joint effusion. Electronically Signed   By: Fidela Salisbury M.D.   On: 01/20/2019 09:41       Assessment & Plan:  Brendan Short is a 58 y.o. male Acute pain of right knee - Plan: DG Knee Complete 4 Views Right Overall reassuring x-ray.  Possible small suprapatellar joint effusion.  Primary area of discomfort was medial joint line but even then very minimal.  Differential includes degenerative joint disease, degenerative meniscal disease.  No mechanical symptoms, no significant effusion appreciated.  -Over-the-counter NSAID temporarily if needed, trial of glucosamine/abrasion.  Trial of web reaction brace, recheck in the next 4 to 6 weeks if not improving, sooner if worse.  No orders of the defined types were placed in this encounter.  Patient Instructions    Knee pain likely due to some wear and tear of the inside part of the knee.  Okay to try over-the-counter glucosamine/chondroitin, okay to continue turmeric.  If glucosamine is not helping after 6 weeks, do not need to continue at that time.  Occasional Aleve for ibuprofen is okay in the short-term.  Try warm up before activity, gentle stretches after  activity.   If you have lab work done today you will be contacted with your lab results within the next 2 weeks.  If you have not heard from [place] then please contact us. The fastest way to get your results is to register for My Chart.   IF you received an x-ray today, you will receive an invoice from [facility]. Please contact [facility] at [phone] with questions or concerns regarding your invoice.   IF you received labwork today, you will receive an invoice from [facility]. Please contact [facility] at  [phone] with questions or concerns regarding your invoice.   Our billing staff will not be able to assist you with questions regarding bills from these companies.  You will be contacted with the lab results as soon as they are available. The fastest way to get your results is to activate your My Chart account. Instructions are located on the last page of this paperwork. If you have not heard from [place] regarding the results in 2 weeks, please contact this office.       Signed,   Merri Ray, MD [facility] [place].  01/20/19 10:00 AM

## 2019-01-20 NOTE — Patient Instructions (Addendum)
Knee pain likely due to some wear and tear of the inside part of the knee.  Okay to try over-the-counter glucosamine/chondroitin, okay to continue turmeric.  If glucosamine is not helping after 6 weeks, do not need to continue at that time.  Occasional Aleve for ibuprofen is okay in the short-term.  Try warm up before activity, gentle stretches after activity. Brace if needed. Recheck in next 4-6 weeks, sooner if worse.    Acute Knee Pain, Adult Acute knee pain is sudden and may be caused by damage, swelling, or irritation of the muscles and tissues that support your knee. The injury may result from:  A fall.  An injury to your knee from twisting motions.  A hit to the knee.  Infection. Acute knee pain may go away on its own with time and rest. If it does not, your health care provider may order tests to find the cause of the pain. These may include:  Imaging tests, such as an X-ray, MRI, or ultrasound.  Joint aspiration. In this test, fluid is removed from the knee.  Arthroscopy. In this test, a lighted tube is inserted into the knee and an image is projected onto a TV screen.  Biopsy. In this test, a sample of tissue is removed from the body and studied under a microscope. Follow these instructions at home: Pay attention to any changes in your symptoms. Take these actions to relieve your pain. If you have a knee sleeve or brace:   Wear the sleeve or brace as told by your health care provider. Remove it only as told by your health care provider.  Loosen the sleeve or brace if your toes tingle, become numb, or turn cold and blue.  Keep the sleeve or brace clean.  If the sleeve or brace is not waterproof: ? Do not let it get wet. ? Cover it with a watertight covering when you take a bath or shower. Activity  Rest your knee.  Do not do things that cause pain or make pain worse.  Avoid high-impact activities or exercises, such as running, jumping rope, or doing jumping  jacks.  Work with a physical therapist to make a safe exercise program, as recommended by your health care provider. Do exercises as told by your physical therapist. Managing pain, stiffness, and swelling   If directed, put ice on the knee: ? Put ice in a plastic bag. ? Place a towel between your skin and the bag. ? Leave the ice on for 20 minutes, 2-3 times a day.  If directed, use an elastic bandage to put pressure (compression) on your injured knee. This may control swelling, give support, and help with discomfort. General instructions  Take over-the-counter and prescription medicines only as told by your health care provider.  Raise (elevate) your knee above the level of your heart when you are sitting or lying down.  Sleep with a pillow under your knee.  Do not use any products that contain nicotine or tobacco, such as cigarettes, e-cigarettes, and chewing tobacco. These can delay healing. If you need help quitting, ask your health care provider.  If you are overweight, work with your health care provider and a dietitian to set a weight-loss goal that is healthy and reasonable for you. Extra weight can put pressure on your knee.  Keep all follow-up visits as told by your health care provider. This is important. Contact a health care provider if:  Your knee pain continues, changes, or gets worse.  You  have a fever along with knee pain.  Your knee feels warm to the touch.  Your knee buckles or locks up. Get help right away if:  Your knee swells, and the swelling becomes worse.  You cannot move your knee.  You have severe pain in your knee. Summary  Acute knee pain can be caused by a fall, an injury, an infection, or damage, swelling, or irritation of the tissues that support your knee.  Your health care provider may perform tests to find out the cause of the pain.  Pay attention to any changes in your symptoms. Relieve your pain with rest, medicines, light activity,  and use of ice.  Get help if your pain continues or becomes worse, your knee swells, or you cannot move your knee. This information is not intended to replace advice given to you by your health care provider. Make sure you discuss any questions you have with your health care provider. Document Released: 08/27/2007 Document Revised: 04/11/2018 Document Reviewed: 04/11/2018 Elsevier Interactive Patient Education  [employer].    If you have lab work done today you will be contacted with your lab results within the next 2 weeks.  If you have not heard from [place] then please contact us. The fastest way to get your results is to register for My Chart.   IF you received an x-ray today, you will receive an invoice from [facility]. Please contact [facility] at [phone] with questions or concerns regarding your invoice.   IF you received labwork today, you will receive an invoice from [place]. Please contact [facility] at [phone] with questions or concerns regarding your invoice.   Our billing staff will not be able to assist you with questions regarding bills from these companies.  You will be contacted with the lab results as soon as they are available. The fastest way to get your results is to activate your My Chart account. Instructions are located on the last page of this paperwork. If you have not heard from [place] regarding the results in 2 weeks, please contact this office.

## 2019-01-28 DIAGNOSIS — D2261 Melanocytic nevi of right upper limb, including shoulder: Secondary | ICD-10-CM | POA: Diagnosis not present

## 2019-01-28 DIAGNOSIS — L570 Actinic keratosis: Secondary | ICD-10-CM | POA: Diagnosis not present

## 2019-01-28 DIAGNOSIS — D225 Melanocytic nevi of trunk: Secondary | ICD-10-CM | POA: Diagnosis not present

## 2019-01-28 DIAGNOSIS — D2372 Other benign neoplasm of skin of left lower limb, including hip: Secondary | ICD-10-CM | POA: Diagnosis not present

## 2019-02-27 ENCOUNTER — Other Ambulatory Visit: Payer: Self-pay | Admitting: Family Medicine

## 2019-02-27 DIAGNOSIS — M109 Gout, unspecified: Secondary | ICD-10-CM

## 2019-02-27 DIAGNOSIS — Z8739 Personal history of other diseases of the musculoskeletal system and connective tissue: Secondary | ICD-10-CM

## 2019-03-13 ENCOUNTER — Encounter: Payer: 59 | Admitting: Family Medicine

## 2019-03-17 ENCOUNTER — Other Ambulatory Visit: Payer: Self-pay

## 2019-03-17 ENCOUNTER — Telehealth (INDEPENDENT_AMBULATORY_CARE_PROVIDER_SITE_OTHER): Payer: 59 | Admitting: Family Medicine

## 2019-03-17 DIAGNOSIS — Z13220 Encounter for screening for lipoid disorders: Secondary | ICD-10-CM

## 2019-03-17 DIAGNOSIS — Z8739 Personal history of other diseases of the musculoskeletal system and connective tissue: Secondary | ICD-10-CM

## 2019-03-17 DIAGNOSIS — Z131 Encounter for screening for diabetes mellitus: Secondary | ICD-10-CM

## 2019-03-17 DIAGNOSIS — D696 Thrombocytopenia, unspecified: Secondary | ICD-10-CM

## 2019-03-17 DIAGNOSIS — M109 Gout, unspecified: Secondary | ICD-10-CM

## 2019-03-17 DIAGNOSIS — Z0000 Encounter for general adult medical examination without abnormal findings: Secondary | ICD-10-CM

## 2019-03-17 MED ORDER — PROBENECID 500 MG PO TABS: 500.0000 mg | ORAL_TABLET | Freq: Every day | ORAL | 3 refills | Status: DC

## 2019-03-17 MED ORDER — COLCHICINE 0.6 MG PO TABS: ORAL_TABLET | ORAL | 3 refills | Status: DC

## 2019-03-17 NOTE — Progress Notes (Signed)
Virtual Visit via Telephone Note  I connected with [name] on 03/17/19 at 8:19 AM by telephone and verified that I am speaking with the correct person using two identifiers.   I discussed the limitations, risks, security and privacy concerns of performing an evaluation and management service by telephone and the availability of in person appointments. I also discussed with the patient that there may be a patient responsible charge related to this service. The patient expressed understanding and agreed to proceed, consent obtained  Chief complaint: CPE   History of Present Illness: Brendan Short is a 58 y.o. male  Gout: Lab Results  Component Value Date   LABURIC 7.1 03/11/2018  Takes probenecid 500 mg daily, has colchicine as needed. Last flare: about 3 months ago. Elbow.   Thrombocytopenia, likely secondary to NASH.  Hematologist Dr. Marin Olp.  Appointment June 2019 with plan follow-up in 1 year.  Cancer screening: Colonoscopy 07/02/2014 - tubular adenomas. Repeat 5-6 yrs? Dr. Benson Norway.  Prostate, evaluated by urology.  appt with urology in October 2019, plan on repeat 1 yr.    Immunization History  Administered Date(s) Administered  . Influenza-Unspecified 10/14/2015  . Tdap 02/01/2010  . Zoster 11/13/2014    Depression screen [facility] 2/9 03/17/2019 01/20/2019 12/19/2018 04/12/2018 03/25/2018  Decreased Interest 0 0 0 0 0  Down, Depressed, Hopeless 0 0 0 0 0  PHQ - 2 Score 0 0 0 0 0    Dental: every 6 months.   Exercise: active with physical job and more walking, tennis. Some golf prior. Cautious with back issue prior.    Patient Active Problem List   Diagnosis Date Noted  . Right elbow pain 12/19/2018  . 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 Thrombocytopenia ([place]) 03/14/2016  . Elevated PSA 03/20/2014  . Gout 06/11/2012   Past Medical History:  Diagnosis Date  . Allergy   . 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 Heart murmur   . Thrombocytopenia ([place])  03/14/2016   Past Surgical History:  Procedure Laterality Date  . broken nose  1970  . broken nose surgery, in the early 70"s    . HERNIA REPAIR     Allergies  Allergen Reactions  . Allopurinol    Prior to Admission medications   Medication Sig Start Date End Date Taking? Authorizing Provider  cholecalciferol (VITAMIN D) 1000 UNITS tablet Take 1,000 Units by mouth daily.   Yes [provider]  colchicine (COLCRYS) 0.6 MG tablet TAKE 1 TABLET BY MOUTH EVERY OTHER DAY 03/11/18  Yes Wendie Agreste, MD  diclofenac (VOLTAREN) 0.1 % ophthalmic solution 4 (four) times daily.   Yes [provider]  glucosamine-chondroitin 500-400 MG tablet Take 1 tablet by mouth 3 (three) times daily.   Yes [provider]  probenecid (BENEMID) 500 MG tablet TAKE 1 TABLET BY MOUTH EVERY DAY 02/27/19  Yes Wendie Agreste, MD  Turmeric 500 MG TABS Take by mouth.   Yes [provider]   Social History   Socioeconomic History  . Marital status: Married    Spouse name: Not on file  . Number of children: 0  . Years of education: Not on file  . Highest education level: Not on file  Occupational History  . Occupation: Landscaper  Social Needs  . Financial resource strain: Not on file  . Food insecurity:    Worry: Not on file    Inability: Not on file  . Transportation needs:    Medical: Not on file  Non-medical: Not on file  Tobacco Use  . Smoking status: Never Smoker  . Smokeless tobacco: Never Used  Substance and Sexual Activity  . Alcohol use: Yes    Alcohol/week: 0.0 standard drinks    Comment: few beers  . Drug use: No  . Sexual activity: Yes  Lifestyle  . Physical activity:    Days per week: Not on file    Minutes per session: Not on file  . Stress: Not on file  Relationships  . Social connections:    Talks on phone: Not on file    Gets together: Not on file    Attends religious service: Not on file    Active member of club or organization: Not on  file    Attends meetings of clubs or organizations: Not on file    Relationship status: Not on file  . Intimate partner violence:    Fear of current or ex partner: Not on file    Emotionally abused: Not on file    Physically abused: Not on file    Forced sexual activity: Not on file  Other Topics Concern  . Not on file  Social History Narrative   Married   Education: College   Exercise: Yes     Observations/Objective: No distress on phone.  Appropriate responses.  Assessment and Plan: Annual physical exam  - -anticipatory guidance as below in AVS, screening labs above. Health maintenance items as above in HPI discussed/recommended as applicable.   Hx of gout - Plan: colchicine (COLCRYS) 0.6 MG tablet, probenecid (BENEMID) 500 MG tablet Gout, unspecified cause, unspecified chronicity, unspecified site - Plan: colchicine (COLCRYS) 0.6 MG tablet, probenecid (BENEMID) 500 MG tablet, Uric Acid  -1 flare 3 months ago, otherwise is been doing okay.  Check uric acid with upcoming blood work, continue probenecid same dose with colchicine as needed for flares  Screening for diabetes mellitus - Plan: Comprehensive metabolic panel  Screening for hyperlipidemia - Plan: Lipid panel  Thrombocytopenia ([place]) - Plan: CBC with Differential/Platelet  -Check CBC, but should have upcoming visit in the next month or so with hematology.  Follow Up Instructions:  Lab visit.  Patient Instructions    Return for fasting blood work as planned.  I refilled your medications at same doses for now.  Call gastroenterology, Dr. Benson Norway as you may be due for repeat colonoscopy.  Let me know if there are questions and take care   If you have lab work done today you will be contacted with your lab results within the next 2 weeks.  If you have not heard from [place] then please contact us. The fastest way to get your results is to register for My Chart.   IF you received an x-ray today, you will receive an invoice  from [facility]. Please contact [facility] at [phone] with questions or concerns regarding your invoice.   IF you received labwork today, you will receive an invoice from [place]. Please contact [facility] at [phone] with questions or concerns regarding your invoice.   Our billing staff will not be able to assist you with questions regarding bills from these companies.  You will be contacted with the lab results as soon as they are available. The fastest way to get your results is to activate your My Chart account. Instructions are located on the last page of this paperwork. If you have not heard from [place] regarding the results in 2 weeks, please contact this office.  I discussed the assessment and treatment plan with the patient. The patient was provided an opportunity to ask questions and all were answered. The patient agreed with the plan and demonstrated an understanding of the instructions.   The patient was advised to call back or seek an in-person evaluation if the symptoms worsen or if the condition fails to improve as anticipated.  I provided 9 minutes of non-face-to-face time during this encounter.  Signed,   Merri Ray, MD [facility].  03/17/19

## 2019-03-17 NOTE — Patient Instructions (Addendum)
Return for fasting blood work as planned.  I refilled your medications at same doses for now.  Call gastroenterology, Dr. Benson Norway as you may be due for repeat colonoscopy.  Let me know if there are questions and take care.   Keeping you healthy  Get these tests  Blood pressure- Have your blood pressure checked once a year by your healthcare provider.  Normal blood pressure is 120/80  Weight- Have your body mass index (BMI) calculated to screen for obesity.  BMI is a measure of body fat based on height and weight. You can also calculate your own BMI at ViewBanking.si.  Cholesterol- Have your cholesterol checked every year.  Diabetes- Have your blood sugar checked regularly if you have high blood pressure, high cholesterol, have a family history of diabetes or if you are overweight.  Screening for Colon Cancer- Colonoscopy starting at age 14.  Screening may begin sooner depending on your family history and other health conditions. Follow up colonoscopy as directed by your Gastroenterologist.  Screening for Prostate Cancer- Both blood work (PSA) and a rectal exam help screen for Prostate Cancer.  Screening begins at age 63 with African-American men and at age 59 with Caucasian men.  Screening may begin sooner depending on your family history.  Take these medicines  Aspirin- One aspirin daily can help prevent Heart disease and Stroke.  Flu shot- Every fall.  Tetanus- Every 10 years.  Zostavax- Once after the age of 78 to prevent Shingles.  Pneumonia shot- Once after the age of 84; if you are younger than 50, ask your healthcare provider if you need a Pneumonia shot.  Take these steps  Don't smoke- If you do smoke, talk to your doctor about quitting.  For tips on how to quit, go to www.smokefree.gov or call 1-800-QUIT-NOW.  Be physically active- Exercise 5 days a week for at least 30 minutes.  If you are not already physically active start slow and gradually work up to 30  minutes of moderate physical activity.  Examples of moderate activity include walking briskly, mowing the yard, dancing, swimming, bicycling, etc.  Eat a healthy diet- Eat a variety of healthy food such as fruits, vegetables, low fat milk, low fat cheese, yogurt, lean meant, poultry, fish, beans, tofu, etc. For more information go to www.thenutritionsource.org  Drink alcohol in moderation- Limit alcohol intake to less than two drinks a day. Never drink and drive.  Dentist- Brush and floss twice daily; visit your dentist twice a year.  Depression- Your emotional health is as important as your physical health. If you're feeling down, or losing interest in things you would normally enjoy please talk to your healthcare provider.  Eye exam- Visit your eye doctor every year.  Safe sex- If you may be exposed to a sexually transmitted infection, use a condom.  Seat belts- Seat belts can save your life; always wear one.  Smoke/Carbon Monoxide detectors- These detectors need to be installed on the appropriate level of your home.  Replace batteries at least once a year.  Skin cancer- When out in the sun, cover up and use sunscreen 15 SPF or higher.  Violence- If anyone is threatening you, please tell your healthcare provider.  Living Will/ Health care power of attorney- Speak with your healthcare provider and family.   If you have lab work done today you will be contacted with your lab results within the next 2 weeks.  If you have not heard from [place] then please contact us. The fastest  way to get your results is to register for My Chart.   IF you received an x-ray today, you will receive an invoice from [facility]. Please contact [facility] at [phone] with questions or concerns regarding your invoice.   IF you received labwork today, you will receive an invoice from [place]. Please contact [facility] at [phone] with questions or concerns regarding your invoice.   Our  billing staff will not be able to assist you with questions regarding bills from these companies.  You will be contacted with the lab results as soon as they are available. The fastest way to get your results is to activate your My Chart account. Instructions are located on the last page of this paperwork. If you have not heard from [place] regarding the results in 2 weeks, please contact this office.

## 2019-03-17 NOTE — Progress Notes (Signed)
CC-CPE- Patient is doing well. Need a refill on colchine, and probenecid. Patient stated he usually get a year supply on these two medication.

## 2019-03-31 ENCOUNTER — Ambulatory Visit: Payer: 59

## 2019-04-01 ENCOUNTER — Ambulatory Visit (INDEPENDENT_AMBULATORY_CARE_PROVIDER_SITE_OTHER): Payer: 59 | Admitting: Family Medicine

## 2019-04-01 ENCOUNTER — Other Ambulatory Visit: Payer: Self-pay

## 2019-04-01 DIAGNOSIS — D696 Thrombocytopenia, unspecified: Secondary | ICD-10-CM | POA: Diagnosis not present

## 2019-04-01 DIAGNOSIS — Z131 Encounter for screening for diabetes mellitus: Secondary | ICD-10-CM | POA: Diagnosis not present

## 2019-04-01 DIAGNOSIS — Z13220 Encounter for screening for lipoid disorders: Secondary | ICD-10-CM | POA: Diagnosis not present

## 2019-04-01 DIAGNOSIS — M109 Gout, unspecified: Secondary | ICD-10-CM

## 2019-04-02 LAB — CBC WITH DIFFERENTIAL/PLATELET
Basophils Absolute: 0 10*3/uL (ref 0.0–0.2)
Basos: 1 %
EOS (ABSOLUTE): 0.1 10*3/uL (ref 0.0–0.4)
Eos: 2 %
Hematocrit: 43.3 % (ref 37.5–51.0)
Hemoglobin: 14.9 g/dL (ref 13.0–17.7)
Immature Grans (Abs): 0 10*3/uL (ref 0.0–0.1)
Immature Granulocytes: 0 %
Lymphocytes Absolute: 1.3 10*3/uL (ref 0.7–3.1)
Lymphs: 31 %
MCH: 33.2 pg — ABNORMAL HIGH (ref 26.6–33.0)
MCHC: 34.4 g/dL (ref 31.5–35.7)
MCV: 96 fL (ref 79–97)
Monocytes Absolute: 0.4 10*3/uL (ref 0.1–0.9)
Monocytes: 10 %
Neutrophils Absolute: 2.2 10*3/uL (ref 1.4–7.0)
Neutrophils: 56 %
Platelets: 132 10*3/uL — ABNORMAL LOW (ref 150–450)
RBC: 4.49 x10E6/uL (ref 4.14–5.80)
RDW: 12.1 % (ref 11.6–15.4)
WBC: 4 10*3/uL (ref 3.4–10.8)

## 2019-04-02 LAB — COMPREHENSIVE METABOLIC PANEL
ALT: 24 IU/L (ref 0–44)
AST: 20 IU/L (ref 0–40)
Albumin/Globulin Ratio: 2.5 — ABNORMAL HIGH (ref 1.2–2.2)
Albumin: 4.9 g/dL (ref 3.8–4.9)
Alkaline Phosphatase: 54 IU/L (ref 39–117)
BUN/Creatinine Ratio: 20 (ref 9–20)
BUN: 20 mg/dL (ref 6–24)
Bilirubin Total: 0.8 mg/dL (ref 0.0–1.2)
CO2: 27 mmol/L (ref 20–29)
Calcium: 9.7 mg/dL (ref 8.7–10.2)
Chloride: 100 mmol/L (ref 96–106)
Creatinine, Ser: 0.98 mg/dL (ref 0.76–1.27)
GFR calc Af Amer: 97 mL/min/{1.73_m2} (ref >59)
GFR calc non Af Amer: 84 mL/min/{1.73_m2} (ref >59)
Globulin, Total: 2 g/dL (ref 1.5–4.5)
Glucose: 126 mg/dL — ABNORMAL HIGH (ref 65–99)
Potassium: 4.5 mmol/L (ref 3.5–5.2)
Sodium: 141 mmol/L (ref 134–144)
Total Protein: 6.9 g/dL (ref 6.0–8.5)

## 2019-04-02 LAB — LIPID PANEL
Chol/HDL Ratio: 4.3 ratio (ref 0.0–5.0)
Cholesterol, Total: 178 mg/dL (ref 100–199)
HDL: 41 mg/dL (ref >39)
LDL Calculated: 100 mg/dL — ABNORMAL HIGH (ref 0–99)
Triglycerides: 184 mg/dL — ABNORMAL HIGH (ref 0–149)
VLDL Cholesterol Cal: 37 mg/dL (ref 5–40)

## 2019-04-02 LAB — URIC ACID: Uric Acid: 7.9 mg/dL (ref 3.7–8.6)

## 2019-04-17 ENCOUNTER — Other Ambulatory Visit: Payer: Self-pay | Admitting: Family Medicine

## 2019-04-17 DIAGNOSIS — Z131 Encounter for screening for diabetes mellitus: Secondary | ICD-10-CM

## 2019-04-17 DIAGNOSIS — R739 Hyperglycemia, unspecified: Secondary | ICD-10-CM

## 2019-04-18 ENCOUNTER — Inpatient Hospital Stay: Payer: 59

## 2019-04-18 ENCOUNTER — Inpatient Hospital Stay: Payer: 59 | Attending: Hematology & Oncology | Admitting: Hematology & Oncology

## 2019-04-18 ENCOUNTER — Encounter: Payer: Self-pay | Admitting: Family Medicine

## 2019-04-18 ENCOUNTER — Ambulatory Visit: Payer: 59 | Admitting: Family Medicine

## 2019-04-18 ENCOUNTER — Other Ambulatory Visit: Payer: Self-pay

## 2019-04-18 VITALS — BP 132/83 | HR 51 | Temp 98.5°F | Resp 18 | Wt 203.0 lb

## 2019-04-18 VITALS — BP 118/80 | HR 67 | Temp 98.5°F | Resp 14 | Ht 75.0 in | Wt 203.0 lb

## 2019-04-18 DIAGNOSIS — D696 Thrombocytopenia, unspecified: Secondary | ICD-10-CM

## 2019-04-18 DIAGNOSIS — M109 Gout, unspecified: Secondary | ICD-10-CM | POA: Diagnosis not present

## 2019-04-18 DIAGNOSIS — R739 Hyperglycemia, unspecified: Secondary | ICD-10-CM

## 2019-04-18 DIAGNOSIS — Z79899 Other long term (current) drug therapy: Secondary | ICD-10-CM

## 2019-04-18 DIAGNOSIS — M79674 Pain in right toe(s): Secondary | ICD-10-CM

## 2019-04-18 DIAGNOSIS — K7581 Nonalcoholic steatohepatitis (NASH): Secondary | ICD-10-CM | POA: Diagnosis not present

## 2019-04-18 LAB — CMP (CANCER CENTER ONLY)
ALT: 20 U/L (ref 0–44)
AST: 17 U/L (ref 15–41)
Albumin: 4.4 g/dL (ref 3.5–5.0)
Alkaline Phosphatase: 49 U/L (ref 38–126)
Anion gap: 7 (ref 5–15)
BUN: 16 mg/dL (ref 6–20)
CO2: 28 mmol/L (ref 22–32)
Calcium: 9 mg/dL (ref 8.9–10.3)
Chloride: 105 mmol/L (ref 98–111)
Creatinine: 0.94 mg/dL (ref 0.61–1.24)
GFR, Est AFR Am: >60 mL/min (ref >60)
GFR, Estimated: >60 mL/min (ref >60)
Glucose, Bld: 124 mg/dL — ABNORMAL HIGH (ref 70–99)
Potassium: 4.4 mmol/L (ref 3.5–5.1)
Sodium: 140 mmol/L (ref 135–145)
Total Bilirubin: 0.6 mg/dL (ref 0.3–1.2)
Total Protein: 7.1 g/dL (ref 6.5–8.1)

## 2019-04-18 LAB — CBC WITH DIFFERENTIAL (CANCER CENTER ONLY)
Abs Immature Granulocytes: 0.01 10*3/uL (ref 0.00–0.07)
Basophils Absolute: 0 10*3/uL (ref 0.0–0.1)
Basophils Relative: 1 %
Eosinophils Absolute: 0.1 10*3/uL (ref 0.0–0.5)
Eosinophils Relative: 1 %
HCT: 41.8 % (ref 39.0–52.0)
Hemoglobin: 14.4 g/dL (ref 13.0–17.0)
Immature Granulocytes: 0 %
Lymphocytes Relative: 30 %
Lymphs Abs: 1.1 10*3/uL (ref 0.7–4.0)
MCH: 33.7 pg (ref 26.0–34.0)
MCHC: 34.4 g/dL (ref 30.0–36.0)
MCV: 97.9 fL (ref 80.0–100.0)
Monocytes Absolute: 0.4 10*3/uL (ref 0.1–1.0)
Monocytes Relative: 10 %
Neutro Abs: 2.2 10*3/uL (ref 1.7–7.7)
Neutrophils Relative %: 58 %
Platelet Count: 129 10*3/uL — ABNORMAL LOW (ref 150–400)
RBC: 4.27 MIL/uL (ref 4.22–5.81)
RDW: 11.9 % (ref 11.5–15.5)
WBC Count: 3.7 10*3/uL — ABNORMAL LOW (ref 4.0–10.5)
nRBC: 0 % (ref 0.0–0.2)

## 2019-04-18 LAB — GLUCOSE, POCT (MANUAL RESULT ENTRY): POC Glucose: 105 mg/dl — AB (ref 70–99)

## 2019-04-18 LAB — POCT GLYCOSYLATED HEMOGLOBIN (HGB A1C): Hemoglobin A1C: 5.7 % — AB (ref 4.0–5.6)

## 2019-04-18 LAB — PLATELET BY CITRATE

## 2019-04-18 MED ORDER — PREDNISONE 20 MG PO TABS: 40.0000 mg | ORAL_TABLET | Freq: Every day | ORAL | 0 refills | Status: DC

## 2019-04-18 NOTE — Progress Notes (Signed)
Hematology and Oncology Follow Up Visit  Brendan Short [id number] Nov 01, 1961 58 y.o. 04/18/2019   Principle Diagnosis:  Thrombocytopenia-likely secondary to NASH  Current Therapy:   Observation   Interim History:  Brendan Short is here today for follow-up.  He is doing quite well.  We see him yearly.  He has had no problems since we last saw him a year ago.  He comes in very tanned.  He his wife went down to [place] for [employer] is.  He does wear sunscreen.  He does work outside.  He make sure that he does a lot of sunscreen and drinks a lot of water.  He has had no bleeding.  There is been no bruising.  He has had no cough or shortness of breath.  He has had no change in bowel or bladder habits.  Overall, his performance status is ECOG 0.    Medications:  Allergies as of 04/18/2019      Reactions   Allopurinol       Medication List       Accurate as of April 18, 2019  8:18 AM. If you have any questions, ask your nurse or doctor.        cholecalciferol 1000 units tablet Commonly known as:  VITAMIN D Take 1,000 Units by mouth daily.   colchicine 0.6 MG tablet Commonly known as:  Colcrys TAKE 1 TABLET BY MOUTH EVERY OTHER DAY   diclofenac 0.1 % ophthalmic solution Commonly known as:  VOLTAREN 4 (four) times daily.   glucosamine-chondroitin 500-400 MG tablet Take 1 tablet by mouth 3 (three) times daily.   probenecid 500 MG tablet Commonly known as:  BENEMID Take 1 tablet (500 mg total) by mouth daily.   Turmeric 500 MG Tabs Take by mouth.       Allergies:  Allergies  Allergen Reactions  . Allopurinol     Past Medical History, Surgical history, Social history, and Family History were reviewed and updated.  Review of Systems: Review of Systems  Constitutional: Negative.   HENT: Negative.   Eyes: Negative.   Respiratory: Negative.   Cardiovascular: Negative.   Gastrointestinal: Negative.   Genitourinary: Negative.   Musculoskeletal: Negative.    Skin: Negative.   Neurological: Negative.   Endo/Heme/Allergies: Negative.   Psychiatric/Behavioral: Negative.      Physical Exam:  weight is 203 lb (92.1 kg). His oral temperature is 98.5 F (36.9 C). His blood pressure is 132/83 and his pulse is 51 (abnormal). His respiration is 18 and oxygen saturation is 100%.   Wt Readings from Last 3 Encounters:  04/18/19 203 lb (92.1 kg)  01/20/19 208 lb 12.8 oz (94.7 kg)  12/19/18 213 lb 3.2 oz (96.7 kg)    Physical Exam Vitals signs reviewed.  HENT:     Head: Normocephalic and atraumatic.  Eyes:     Pupils: Pupils are equal, round, and reactive to light.  Neck:     Musculoskeletal: Normal range of motion.  Cardiovascular:     Rate and Rhythm: Normal rate and regular rhythm.     Heart sounds: Normal heart sounds.  Pulmonary:     Effort: Pulmonary effort is normal.     Breath sounds: Normal breath sounds.  Abdominal:     General: Bowel sounds are normal.     Palpations: Abdomen is soft.  Musculoskeletal: Normal range of motion.        General: No tenderness or deformity.  Lymphadenopathy:     Cervical: No cervical  adenopathy.  Skin:    General: Skin is warm and dry.     Findings: No erythema or rash.  Neurological:     Mental Status: He is alert and oriented to person, place, and time.  Psychiatric:        Behavior: Behavior normal.        Thought Content: Thought content normal.        Judgment: Judgment normal.      Lab Results  Component Value Date   WBC 3.7 (L) 04/18/2019   HGB 14.4 04/18/2019   HCT 41.8 04/18/2019   MCV 97.9 04/18/2019   PLT 129 (L) 04/18/2019   No results found for: FERRITIN, IRON, TIBC, UIBC, IRONPCTSAT Lab Results  Component Value Date   RBC 4.27 04/18/2019   No results found for: KPAFRELGTCHN, LAMBDASER, KAPLAMBRATIO No results found for: IGGSERUM, IGA, IGMSERUM No results found for: Odetta Pink, SPEI   Chemistry      Component  Value Date/Time   NA 141 04/01/2019 0928   K 4.5 04/01/2019 0928   CL 100 04/01/2019 0928   CO2 27 04/01/2019 0928   BUN 20 04/01/2019 0928   CREATININE 0.98 04/01/2019 0928   CREATININE 0.97 02/03/2016 1446      Component Value Date/Time   CALCIUM 9.7 04/01/2019 0928   ALKPHOS 54 04/01/2019 0928   AST 20 04/01/2019 0928   ALT 24 04/01/2019 0928   BILITOT 0.8 04/01/2019 0928      Impression and Plan: Brendan Short is a very pleasant 58 yo caucasian male with history of thombocytopenia secondary to NASH.  So far, there really has been no change in his platelet count.  His platelet count has been slightly low for about a year and a half or more.  I just do not think that he is going to have much of a change with his platelet count unless his liver function worsens.  I think this would be a little bit unusual if this happens.  He is very careful with what he eats and drinks.  I think we can let him go from the clinic.  Again we just really not helping with his medical care or adding to his medical care from what his family doctor is already doing.  I told him we could always get him back if he does have problems down the road.        Volanda Napoleon, MD 6/5/20208:18 AM

## 2019-04-18 NOTE — Patient Instructions (Addendum)
Prednisone 2 pills/day for the next 3 days.  That should help if swelling is from gout.  X-ray was ordered for [facility] imaging.  Can likely have that done Monday morning.  Depending on the results of x-ray, and how your toe is improving, it may be best to follow-up by telemedicine in 1 week to decide on further evaluation, and can discuss changing gout regimen if needed at that time as well. Ok to splint toe for now.   Return to the clinic or go to the nearest emergency room if any of your symptoms worsen or new symptoms occur.     If you have lab work done today you will be contacted with your lab results within the next 2 weeks.  If you have not heard from [place] then please contact us. The fastest way to get your results is to register for My Chart.   IF you received an x-ray today, you will receive an invoice from [facility]. Please contact [facility] at [phone] with questions or concerns regarding your invoice.   IF you received labwork today, you will receive an invoice from [place]. Please contact [facility] at [phone] with questions or concerns regarding your invoice.   Our billing staff will not be able to assist you with questions regarding bills from these companies.  You will be contacted with the lab results as soon as they are available. The fastest way to get your results is to activate your My Chart account. Instructions are located on the last page of this paperwork. If you have not heard from [place] regarding the results in 2 weeks, please contact this office.

## 2019-04-18 NOTE — Progress Notes (Signed)
Subjective:    Patient ID: Brendan Short, male    DOB: [birth year], 58 y.o.   MRN: [id number]  HPI [name] is a 58 y.o. male Presents today for: Chief Complaint  Patient presents with  . Foot Pain    right foot middle toe pain. Pain in joints off/ on was p0ut on glucosamin - chondrotin and it seems to be helping a little   R middle toe pain: [employer] on a bank. 10 days ago. Felt a shift in steel toe boot, no known injury. Noticed some soreness in that toe.  Feels like joint in the middle of that toe was pushing into the 4th toe past year. Had not been swollen prior or pain.  Now with some redness, swelling of that toe past week. No wounds/disharge or drainage.  Feels worse rather than better this week .   Had appt with hematology today - platelets look better.    Tx: ibuprofen 432m BID, ice a few times per day.   Has been on colcrys every other day, probenecid 5027mdaily.    Patient Active Problem List   Diagnosis Date Noted  . Right elbow pain 12/19/2018  . Drug-induced leukopenia (HCC[facility]05/12/2015  . Thrombocytopenia (HCMalo05/12/2015  . Elevated PSA 03/20/2014  . Gout 06/11/2012   Past Medical History:  Diagnosis Date  . Allergy   . Drug-induced leukopenia (HCRon[place]5/12/2015  . Heart murmur   . Thrombocytopenia (HCWest St. [place]5/12/2015   Past Surgical History:  Procedure Laterality Date  . broken nose  1970  . broken nose surgery, in the early 70"s    . HERNIA REPAIR     Allergies  Allergen Reactions  . Allopurinol    Prior to Admission medications   Medication Sig Start Date End Date Taking? Authorizing Provider  cholecalciferol (VITAMIN D) 1000 UNITS tablet Take 1,000 Units by mouth daily.   Yes [provider]  colchicine (COLCRYS) 0.6 MG tablet TAKE 1 TABLET BY MOUTH EVERY OTHER DAY 03/17/19  Yes GrWendie AgresteMD  diclofenac (VOLTAREN) 0.1 % ophthalmic solution 4 (four) times daily. PRN   Yes [provider]  glucosamine-chondroitin  500-400 MG tablet Take 1 tablet by mouth 3 (three) times daily.   Yes [provider]  probenecid (BENEMID) 500 MG tablet Take 1 tablet (500 mg total) by mouth daily. 03/17/19  Yes GrWendie AgresteMD  Turmeric 500 MG TABS Take by mouth.   Yes [provider]   Social History   Socioeconomic History  . Marital status: Married    Spouse name: Not on file  . Number of children: 0  . Years of education: Not on file  . Highest education level: Not on file  Occupational History  . Occupation: Landscaper  Social Needs  . Financial resource strain: Not on file  . Food insecurity:    Worry: Not on file    Inability: Not on file  . Transportation needs:    Medical: Not on file    Non-medical: Not on file  Tobacco Use  . Smoking status: Never Smoker  . Smokeless tobacco: Never Used  Substance and Sexual Activity  . Alcohol use: Yes    Alcohol/week: 0.0 standard drinks    Comment: few beers  . Drug use: No  . Sexual activity: Yes  Lifestyle  . Physical activity:    Days per week: Not on file    Minutes per session: Not on file  . Stress: Not  on file  Relationships  . Social connections:    Talks on phone: Not on file    Gets together: Not on file    Attends religious service: Not on file    Active member of club or organization: Not on file    Attends meetings of clubs or organizations: Not on file    Relationship status: Not on file  . Intimate partner violence:    Fear of current or ex partner: Not on file    Emotionally abused: Not on file    Physically abused: Not on file    Forced sexual activity: Not on file  Other Topics Concern  . Not on file  Social History Narrative   Married   Education: College   Exercise: Yes    Review of Systems per HPI    Objective:   Physical Exam Vitals signs reviewed.  Constitutional:      General: He is not in acute distress.    Appearance: He is well-developed.  HENT:     Head: Normocephalic and atraumatic.   Cardiovascular:     Rate and Rhythm: Normal rate.  Pulmonary:     Effort: Pulmonary effort is normal.  Musculoskeletal:       Feet:  Neurological:     Mental Status: He is alert and oriented to person, place, and time.    Vitals:   04/18/19 1548  BP: 118/80  Pulse: 67  Resp: 14  Temp: 98.5 F (36.9 C)  TempSrc: Oral  SpO2: 97%  Weight: 203 lb (92.1 kg)  Height: 6' 3"  (1.905 m)   Results for orders placed or performed in visit on 04/18/19  POCT glucose (manual entry)  Result Value Ref Range   POC Glucose 105 (A) 70 - 99 mg/dl  POCT glycosylated hemoglobin (Hb A1C)  Result Value Ref Range   Hemoglobin A1C 5.7 (A) 4.0 - 5.6 %   HbA1c POC (<> result, manual entry)     HbA1c, POC (prediabetic range)     HbA1c, POC (controlled diabetic range)          Assessment & Plan:   Brendan Short is a 58 y.o. male Toe pain, right - Plan: DG Toe 3rd Right, predniSONE (DELTASONE) 20 MG tablet  Gout involving toe of right foot, unspecified cause, unspecified chronicity - Plan: predniSONE (DELTASONE) 20 MG tablet  Hyperglycemia - Plan: POCT glucose (manual entry), POCT glycosylated hemoglobin (Hb A1C)  Possible gout flare, vs toe injury from misstep approx 10 days ago. Unlikely infection.   -schedule XR of toe in 3 days, buddy tape for now   - based on timing of sx's, will start prednisone- potential side effects discussed.   - A1c borderline prediabetes range - can discuss at follow up, short course of prednisone above.   - recheck in 1 week, sooner if worse.    Meds ordered this encounter  Medications  . predniSONE (DELTASONE) 20 MG tablet    Sig: Take 2 tablets (40 mg total) by mouth daily with breakfast.    Dispense:  6 tablet    Refill:  0   Patient Instructions   Prednisone 2 pills/day for the next 3 days.  That should help if swelling is from gout.  X-ray was ordered for [facility] imaging.  Can likely have that done Monday morning.  Depending on the results of  x-ray, and how your toe is improving, it may be best to follow-up by telemedicine in 1 week to decide on further  evaluation, and can discuss changing gout regimen if needed at that time as well. Ok to splint toe for now.   Return to the clinic or go to the nearest emergency room if any of your symptoms worsen or new symptoms occur.     If you have lab work done today you will be contacted with your lab results within the next 2 weeks.  If you have not heard from [place] then please contact us. The fastest way to get your results is to register for My Chart.   IF you received an x-ray today, you will receive an invoice from [facility]. Please contact [facility] at [phone] with questions or concerns regarding your invoice.   IF you received labwork today, you will receive an invoice from [place]. Please contact [facility] at [phone] with questions or concerns regarding your invoice.   Our billing staff will not be able to assist you with questions regarding bills from these companies.  You will be contacted with the lab results as soon as they are available. The fastest way to get your results is to activate your My Chart account. Instructions are located on the last page of this paperwork. If you have not heard from [place] regarding the results in 2 weeks, please contact this office.

## 2019-04-19 ENCOUNTER — Encounter: Payer: Self-pay | Admitting: Family Medicine

## 2019-04-21 ENCOUNTER — Other Ambulatory Visit: Payer: Self-pay

## 2019-04-21 ENCOUNTER — Ambulatory Visit
Admission: RE | Admit: 2019-04-21 | Discharge: 2019-04-21 | Disposition: A | Discharge status: Home / Self Care | Payer: 59 | Source: Ambulatory Visit | Attending: Family Medicine | Admitting: Family Medicine

## 2019-04-21 DIAGNOSIS — M79674 Pain in right toe(s): Secondary | ICD-10-CM

## 2019-04-25 ENCOUNTER — Telehealth (INDEPENDENT_AMBULATORY_CARE_PROVIDER_SITE_OTHER): Payer: 59 | Admitting: Family Medicine

## 2019-04-25 ENCOUNTER — Telehealth: Payer: Self-pay

## 2019-04-25 ENCOUNTER — Other Ambulatory Visit: Payer: Self-pay

## 2019-04-25 DIAGNOSIS — M109 Gout, unspecified: Secondary | ICD-10-CM

## 2019-04-25 DIAGNOSIS — Z8739 Personal history of other diseases of the musculoskeletal system and connective tissue: Secondary | ICD-10-CM | POA: Diagnosis not present

## 2019-04-25 MED ORDER — PROBENECID 500 MG PO TABS: 500.0000 mg | ORAL_TABLET | Freq: Two times a day (BID) | ORAL | 1 refills | Status: DC

## 2019-04-25 NOTE — Progress Notes (Signed)
Toe pain and gout follow up, was given the predisone that did help. Did take indomethacin which did help. Did do an xray there was no abnormality just some mineralizations. He is asking for letter to present for work on Monday. The Letter has been done and is located in the pick up box up front

## 2019-04-25 NOTE — Patient Instructions (Signed)
As long as gout improves this time, no new meds for now. Follow up if worsens.   After current flare resolved in next few weeks can try increasing probenecid to twice per day.   Follow up in 6 weeks for repeat bloodwork.  Return to the clinic or go to the nearest emergency room if any of your symptoms worsen or new symptoms occur.

## 2019-04-25 NOTE — Telephone Encounter (Signed)
Letter has been left up front for pick up

## 2019-04-25 NOTE — Progress Notes (Signed)
Virtual Visit via Telephone Note  I connected with [name] on 04/25/19 at 12:30 PM by telephone and verified that I am speaking with the correct person using two identifiers.   I discussed the limitations, risks, security and privacy concerns of performing an evaluation and management service by telephone and the availability of in person appointments. I also discussed with the patient that there may be a patient responsible charge related to this service. The patient expressed understanding and agreed to proceed, consent obtained  Chief complaint: Gout follow up  History of Present Illness: Brendan Short is a 58 y.o. male  Follow-up foot pain/R toe pain.  Suspected gout.  X-ray was obtained as possible injury prior to onset of symptoms but was negative for fracture.  Treated with prednisone 40 mg daily x 3 days. That helped. Took a few indomethacin past few days, but pain almost gone. Foot pain has improved. Able to walk yesterday, icing for pain.  Out of work this week.  Plan on return next week to work. Note provided.   Lab Results  Component Value Date   LABURIC 7.9 04/01/2019  [place] - thinks reason for elevated reading.  Taking colcrys every other day.  Probenecid 557m QD.   Flare in February, and last fall in ankle.   Lab Results  Component Value Date   CREATININE 0.94 04/18/2019      Patient Active Problem List   Diagnosis Date Noted  . Right elbow pain 12/19/2018  . Drug-induced leukopenia (HWest [place] 03/14/2016  . Thrombocytopenia (HKear[place] 03/14/2016  . Elevated PSA 03/20/2014  . Gout 06/11/2012   Past Medical History:  Diagnosis Date  . Allergy   . Drug-induced leukopenia (HSc[place] 03/14/2016  . Heart murmur   . Thrombocytopenia (HVenice 03/14/2016   Past Surgical History:  Procedure Laterality Date  . broken nose  1970  . broken nose surgery, in the early 70"s    . HERNIA REPAIR     Allergies  Allergen Reactions  . Allopurinol     Prior to Admission medications   Medication Sig Start Date End Date Taking? Authorizing Provider  cholecalciferol (VITAMIN D) 1000 UNITS tablet Take 1,000 Units by mouth daily.    [provider]  colchicine (COLCRYS) 0.6 MG tablet TAKE 1 TABLET BY MOUTH EVERY OTHER DAY 03/17/19   GWendie Agreste MD  diclofenac (VOLTAREN) 0.1 % ophthalmic solution 4 (four) times daily. PRN    [provider]  glucosamine-chondroitin 500-400 MG tablet Take 1 tablet by mouth 3 (three) times daily.    [provider]  predniSONE (DELTASONE) 20 MG tablet Take 2 tablets (40 mg total) by mouth daily with breakfast. 04/18/19   GWendie Agreste MD  probenecid (BENEMID) 500 MG tablet Take 1 tablet (500 mg total) by mouth daily. 03/17/19   GWendie Agreste MD  Turmeric 500 MG TABS Take by mouth.    [provider]   Social History   Socioeconomic History  . Marital status: Married    Spouse name: Not on file  . Number of children: 0  . Years of education: Not on file  . Highest education level: Not on file  Occupational History  . Occupation: Landscaper  Social Needs  . Financial resource strain: Not on file  . Food insecurity    Worry: Not on file    Inability: Not on file  . Transportation needs    Medical: Not on file  Non-medical: Not on file  Tobacco Use  . Smoking status: Never Smoker  . Smokeless tobacco: Never Used  Substance and Sexual Activity  . Alcohol use: Yes    Alcohol/week: 0.0 standard drinks    Comment: few beers  . Drug use: No  . Sexual activity: Yes  Lifestyle  . Physical activity    Days per week: Not on file    Minutes per session: Not on file  . Stress: Not on file  Relationships  . Social Herbalist on phone: Not on file    Gets together: Not on file    Attends religious service: Not on file    Active member of club or organization: Not on file    Attends meetings of clubs or organizations: Not on file    Relationship  status: Not on file  . Intimate partner violence    Fear of current or ex partner: Not on file    Emotionally abused: Not on file    Physically abused: Not on file    Forced sexual activity: Not on file  Other Topics Concern  . Not on file  Social History Narrative   Married   Education: College   Exercise: Yes     Observations/Objective: No distress, appropriate responses on phone.   Assessment and Plan: Hx of gout - Plan: probenecid (BENEMID) 500 MG tablet,   Gout, unspecified cause, unspecified chronicity, unspecified site - Plan: probenecid (BENEMID) 500 MG tablet,   -Current flare is improving after prednisone and episodic indomethacin.  Continue symptomatic care as needed but return to clinic precautions discussed if any worsening at this point.  Plan for increase probenecid to twice daily dosing in the next few weeks once symptoms have resolved.  Follow-up office visit in 6 weeks for repeat testing with uric acid and creatinine   Follow Up Instructions:    I discussed the assessment and treatment plan with the patient. The patient was provided an opportunity to ask questions and all were answered. The patient agreed with the plan and demonstrated an understanding of the instructions.   The patient was advised to call back or seek an in-person evaluation if the symptoms worsen or if the condition fails to improve as anticipated.  I provided 8 minutes of non-face-to-face time during this encounter.  Signed,   Merri Ray, MD [facility] [place].  04/25/19

## 2019-04-28 ENCOUNTER — Other Ambulatory Visit: Payer: Self-pay | Admitting: Family Medicine

## 2019-04-28 DIAGNOSIS — M25521 Pain in right elbow: Secondary | ICD-10-CM

## 2019-04-28 DIAGNOSIS — M1A0210 Idiopathic chronic gout, right elbow, without tophus (tophi): Secondary | ICD-10-CM

## 2019-11-13 ENCOUNTER — Other Ambulatory Visit: Payer: Self-pay | Admitting: Family Medicine

## 2019-11-13 DIAGNOSIS — M25521 Pain in right elbow: Secondary | ICD-10-CM

## 2019-11-13 DIAGNOSIS — M1A0210 Idiopathic chronic gout, right elbow, without tophus (tophi): Secondary | ICD-10-CM

## 2019-12-04 ENCOUNTER — Other Ambulatory Visit: Payer: Self-pay

## 2019-12-04 ENCOUNTER — Ambulatory Visit: Payer: 59 | Admitting: Family Medicine

## 2019-12-04 ENCOUNTER — Encounter: Payer: Self-pay | Admitting: Family Medicine

## 2019-12-04 ENCOUNTER — Other Ambulatory Visit: Payer: Self-pay | Admitting: Family Medicine

## 2019-12-04 VITALS — BP 122/74 | HR 59 | Temp 98.7°F | Ht 75.0 in | Wt 206.0 lb

## 2019-12-04 DIAGNOSIS — M109 Gout, unspecified: Secondary | ICD-10-CM

## 2019-12-04 DIAGNOSIS — S39012A Strain of muscle, fascia and tendon of lower back, initial encounter: Secondary | ICD-10-CM | POA: Diagnosis not present

## 2019-12-04 DIAGNOSIS — Z8739 Personal history of other diseases of the musculoskeletal system and connective tissue: Secondary | ICD-10-CM

## 2019-12-04 MED ORDER — CYCLOBENZAPRINE HCL 5 MG PO TABS: ORAL_TABLET | ORAL | 1 refills | Status: DC

## 2019-12-04 MED ORDER — PREDNISONE 20 MG PO TABS: 40.0000 mg | ORAL_TABLET | Freq: Every day | ORAL | 0 refills | Status: DC

## 2019-12-04 MED ORDER — INDOMETHACIN 25 MG PO CAPS: 25.0000 mg | ORAL_CAPSULE | Freq: Three times a day (TID) | ORAL | 0 refills | Status: DC

## 2019-12-04 NOTE — Patient Instructions (Addendum)
Recurrent back flare, can try Flexeril, other information below.  Okay to continue ibuprofen for now, make sure you take that with food.  Stop if any stomach upset.  If not improving in the next 4 to 5 days, I did print out a prescription for prednisone.  If you start prednisone, do not take any anti-inflammatories including ibuprofen.Return to the clinic or go to the nearest emergency room if any of your symptoms worsen or new symptoms occur.  For gout I will check the gout blood test.  Continue probenecid for now.  Ask pharmacist about colchicine options that may be less expensive and let me know.  Indomethacin was refilled temporarily if you have a gout flare.  Acute Back Pain, Adult Acute back pain is sudden and usually short-lived. It is often caused by an injury to the muscles and tissues in the back. The injury may result from:  A muscle or ligament getting overstretched or torn (strained). Ligaments are tissues that connect bones to each other. Lifting something improperly can cause a back strain.  Wear and tear (degeneration) of the spinal disks. Spinal disks are circular tissue that provides cushioning between the bones of the spine (vertebrae).  Twisting motions, such as while playing sports or doing yard work.  A hit to the back.  Arthritis. You may have a physical exam, lab tests, and imaging tests to find the cause of your pain. Acute back pain usually goes away with rest and home care. Follow these instructions at home: Managing pain, stiffness, and swelling  Take over-the-counter and prescription medicines only as told by your health care provider.  Your health care provider may recommend applying ice during the first 24-48 hours after your pain starts. To do this: ? Put ice in a plastic bag. ? Place a towel between your skin and the bag. ? Leave the ice on for 20 minutes, 2-3 times a day.  If directed, apply heat to the affected area as often as told by your health care  provider. Use the heat source that your health care provider recommends, such as a moist heat pack or a heating pad. ? Place a towel between your skin and the heat source. ? Leave the heat on for 20-30 minutes. ? Remove the heat if your skin turns bright red. This is especially important if you are unable to feel pain, heat, or cold. You have a greater risk of getting burned. Activity   Do not stay in bed. Staying in bed for more than 1-2 days can delay your recovery.  Sit up and stand up straight. Avoid leaning forward when you sit, or hunching over when you stand. ? If you work at a desk, sit close to it so you do not need to lean over. Keep your chin tucked in. Keep your neck drawn back, and keep your elbows bent at a right angle. Your arms should look like the letter "L." ? Sit high and close to the steering wheel when you drive. Add lower back (lumbar) support to your car seat, if needed.  Take short walks on even surfaces as soon as you are able. Try to increase the length of time you walk each day.  Do not sit, drive, or stand in one place for more than 30 minutes at a time. Sitting or standing for long periods of time can put stress on your back.  Do not drive or use heavy machinery while taking prescription pain medicine.  Use proper lifting techniques.  When you bend and lift, use positions that put less stress on your back: ? [place] your knees. ? Keep the load close to your body. ? Avoid twisting.  Exercise regularly as told by your health care provider. Exercising helps your back heal faster and helps prevent back injuries by keeping muscles strong and flexible.  Work with a physical therapist to make a safe exercise program, as recommended by your health care provider. Do any exercises as told by your physical therapist. Lifestyle  Maintain a healthy weight. Extra weight puts stress on your back and makes it difficult to have good posture.  Avoid activities or situations that  make you feel anxious or stressed. Stress and anxiety increase muscle tension and can make back pain worse. Learn ways to manage anxiety and stress, such as through exercise. General instructions  Sleep on a firm mattress in a comfortable position. Try lying on your side with your knees slightly bent. If you lie on your back, put a pillow under your knees.  Follow your treatment plan as told by your health care provider. This may include: ? Cognitive or behavioral therapy. ? Acupuncture or massage therapy. ? Meditation or yoga. Contact a health care provider if:  You have pain that is not relieved with rest or medicine.  You have increasing pain going down into your legs or buttocks.  Your pain does not improve after 2 weeks.  You have pain at night.  You lose weight without trying.  You have a fever or chills. Get help right away if:  You develop new bowel or bladder control problems.  You have unusual weakness or numbness in your arms or legs.  You develop nausea or vomiting.  You develop abdominal pain.  You feel faint. Summary  Acute back pain is sudden and usually short-lived.  Use proper lifting techniques. When you bend and lift, use positions that put less stress on your back.  Take over-the-counter and prescription medicines and apply heat or ice as directed by your health care provider. This information is not intended to replace advice given to you by your health care provider. Make sure you discuss any questions you have with your health care provider. Document Revised: 02/18/2019 Document Reviewed: 06/13/2017 Elsevier Patient Education  [employer].    If you have lab work done today you will be contacted with your lab results within the next 2 weeks.  If you have not heard from [place] then please contact us. The fastest way to get your results is to register for My Chart.   IF you received an x-ray today, you will receive an invoice from [facility]  Radiology. Please contact [facility] at [phone] with questions or concerns regarding your invoice.   IF you received labwork today, you will receive an invoice from [facility]. Please contact [facility] at [phone] with questions or concerns regarding your invoice.   Our billing staff will not be able to assist you with questions regarding bills from these companies.  You will be contacted with the lab results as soon as they are available. The fastest way to get your results is to activate your My Chart account. Instructions are located on the last page of this paperwork. If you have not heard from [place] regarding the results in 2 weeks, please contact this office.

## 2019-12-04 NOTE — Progress Notes (Signed)
Subjective:  Patient ID: Brendan Short, male    DOB: 1961-07-23  Age: 59 y.o. MRN: [id number]  CC:  Chief Complaint  Patient presents with  . Back Pain    lower back pain. stared on 12/01/2018 pt woke up on tuesday and pt felt his back tightining up trough out the day and work. pt has lost a little bit of his range of motion with his back. pt has tried rest and using his tens unit to help which hasn't helped much.the pain is a contstant pressure. pain level is a 5-6/10    HPI Brendan Short presents for    Low back pain Off work 3 days ago. Noticed back tightening up throughout the day 2 days ago.  Some decreased range of motion past few days. Walking some last weekend, no known injury. No golf recently.  No leg radiation. Mid lower back. 5/10 pain.  No bowel or bladder incontinence, no saddle anesthesia, no lower extremity weakness.  Tx: Tens - minimal help when on then returns. Ibuprofen 2 pills tid, some improvement. Out of mm relaxer.   History of lumbar strains previously, treated with steroids, then meloxicam, Flexeril.  Has also been treated by [facility], Dr. Ronnald Ramp, in 2019 for extraforaminal disc herniation on the right L4-5.  Recommended exercise therapy, yoga Pilates NSAIDs as needed and activity modification.   MRI in 2019: IMPRESSION: 1. Moderate-sized focal lateral foraminal and extraforaminal disc protrusion on the right at L4-5 contacting and displacing the right L4 nerve root. Possible free disc fragment. 2. Shallow broad-based extraforaminal disc protrusion on the right at L2-3 potentially irritating the exiting right L2 nerve root. 3. Diffuse annular bulge at L3-4 with mild bilateral lateral recess and mild bilateral foraminal encroachment  Gout: Last flare: early last month. Cut back on alcohol. Probenecid 575m qd. Prior colcrys QOD - stopped few months d/t cost.  Prn med: indomethacin - out of now.  Lab Results  Component Value Date   LABURIC 7.9 04/01/2019     History Patient Active Problem List   Diagnosis Date Noted  . Right elbow pain 12/19/2018  . 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 Thrombocytopenia ([facility] 03/14/2016  . Elevated PSA 03/20/2014  . Gout 06/11/2012   Past Medical History:  Diagnosis Date  . Allergy   . 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 Heart murmur   . Thrombocytopenia ([facility] 03/14/2016   Past Surgical History:  Procedure Laterality Date  . broken nose  1970  . broken nose surgery, in the early 70"s    . HERNIA REPAIR     Allergies  Allergen Reactions  . Allopurinol    Prior to Admission medications   Medication Sig Start Date End Date Taking? Authorizing Provider  cholecalciferol (VITAMIN D) 1000 UNITS tablet Take 1,000 Units by mouth daily.   Yes [provider]  diclofenac (VOLTAREN) 0.1 % ophthalmic solution 4 (four) times daily. PRN   Yes [provider]  glucosamine-chondroitin 500-400 MG tablet Take 1 tablet by mouth 3 (three) times daily.   Yes [provider]  probenecid (BENEMID) 500 MG tablet Take 1 tablet (500 mg total) by mouth 2 (two) times daily. 04/25/19  Yes GWendie Agreste MD  Turmeric 500 MG TABS Take by mouth.   Yes [provider]  colchicine (COLCRYS) 0.6 MG tablet TAKE 1 TABLET BY MOUTH EVERY OTHER DAY Patient not taking: Reported on 12/04/2019 03/17/19   GWendie Agreste MD  predniSONE (DELTASONE)  20 MG tablet Take 2 tablets (40 mg total) by mouth daily with breakfast. Patient not taking: Reported on 12/04/2019 04/18/19   Wendie Agreste, MD   Social History   Socioeconomic History  . Marital status: Married    Spouse name: Not on file  . Number of children: 0  . Years of education: Not on file  . Highest education level: Not on file  Occupational History  . Occupation: Landscaper  Tobacco Use  . Smoking status: Never Smoker  . Smokeless tobacco: Never Used  Substance and Sexual Activity  . Alcohol  use: Yes    Alcohol/week: 0.0 standard drinks    Comment: few beers  . Drug use: No  . Sexual activity: Yes  Other Topics Concern  . Not on file  Social History Narrative   Married   Education: Secretary/administrator   Exercise: Yes   Social Determinants of Health   Financial Resource Strain:   . Difficulty of Paying Living Expenses: Not on file  Food Insecurity:   . Worried About Charity fundraiser in the Last Year: Not on file  . Ran Out of Food in the Last Year: Not on file  Transportation Needs:   . Lack of Transportation (Medical): Not on file  . Lack of Transportation (Non-Medical): Not on file  Physical Activity:   . Days of Exercise per Week: Not on file  . Minutes of Exercise per Session: Not on file  Stress:   . Feeling of Stress : Not on file  Social Connections:   . Frequency of Communication with Friends and Family: Not on file  . Frequency of Social Gatherings with Friends and Family: Not on file  . Attends Religious Services: Not on file  . Active Member of Clubs or Organizations: Not on file  . Attends Archivist Meetings: Not on file  . Marital Status: Not on file  Intimate Partner Violence:   . Fear of Current or Ex-Partner: Not on file  . Emotionally Abused: Not on file  . Physically Abused: Not on file  . Sexually Abused: Not on file    Review of Systems   Objective:   Vitals:   12/04/19 1631  BP: 122/74  Pulse: (!) 59  Temp: 98.7 F (37.1 C)  TempSrc: Temporal  SpO2: 99%  Weight: 206 lb (93.4 kg)  Height: 6' 3"  (1.905 m)     Physical Exam Vitals reviewed.  Constitutional:      General: He is not in acute distress.    Appearance: He is well-developed.  HENT:     Head: Normocephalic and atraumatic.  Cardiovascular:     Rate and Rhythm: Normal rate.  Pulmonary:     Effort: Pulmonary effort is normal.  Musculoskeletal:     Lumbar back: Spasms (R>L paraspinals. ) and tenderness present. No deformity or bony tenderness. Decreased range  of motion: min decreased lateral flexion bilat. intact flexion. able to heel and toe walk without difficulty.  Negative right straight leg raise test and negative left straight leg raise test.  Neurological:     Mental Status: He is alert and oriented to person, place, and time.       Assessment & Plan:  Brendan Short is a 59 y.o. male . Strain of lumbar region, initial encounter - Plan: cyclobenzaprine (FLEXERIL) 5 MG tablet, predniSONE (DELTASONE) 20 MG tablet  -Lumbar strain, injury.  No red flags on exam or history.  History of degenerative disc disease, disc herniation  from 2019.  Overall has been doing well.  We will try Flexeril initially with ibuprofen, other symptomatic care.  Option of prednisone in place of ibuprofen if not improving in the next 4 to 5 days with potential side effects and risks of meds discussed.  RTC precautions.  Hx of gout - Plan: Uric Acid, indomethacin (INDOCIN) 25 MG capsule  -Continue probenecid, check uric acid.  Indomethacin prescribed for acute flare.  He will check into options from pharmacist regarding colchicine.  Follow-up to be determined by frequency of flares, but should be due for physical in 4 months.  Meds ordered this encounter  Medications  . indomethacin (INDOCIN) 25 MG capsule    Sig: Take 1 capsule (25 mg total) by mouth 3 (three) times daily with meals. DURING GOUT FLARE if needed.    Dispense:  30 capsule    Refill:  0  . cyclobenzaprine (FLEXERIL) 5 MG tablet    Sig: 1 pill by mouth up to every 8 hours as needed. Start with one pill by mouth each bedtime as needed due to sedation    Dispense:  15 tablet    Refill:  1  . predniSONE (DELTASONE) 20 MG tablet    Sig: Take 2 tablets (40 mg total) by mouth daily with breakfast.    Dispense:  10 tablet    Refill:  0   Patient Instructions   Recurrent back flare, can try Flexeril, other information below.  Okay to continue ibuprofen for now, make sure you take that with food.  Stop if  any stomach upset.  If not improving in the next 4 to 5 days, I did print out a prescription for prednisone.  If you start prednisone, do not take any anti-inflammatories including ibuprofen.Return to the clinic or go to the nearest emergency room if any of your symptoms worsen or new symptoms occur.  For gout I will check the gout blood test.  Continue probenecid for now.  Ask pharmacist about colchicine options that may be less expensive and let me know.  Indomethacin was refilled temporarily if you have a gout flare.  Acute Back Pain, Adult Acute back pain is sudden and usually short-lived. It is often caused by an injury to the muscles and tissues in the back. The injury may result from:  A muscle or ligament getting overstretched or torn (strained). Ligaments are tissues that connect bones to each other. Lifting something improperly can cause a back strain.  Wear and tear (degeneration) of the spinal disks. Spinal disks are circular tissue that provides cushioning between the bones of the spine (vertebrae).  Twisting motions, such as while playing sports or doing yard work.  A hit to the back.  Arthritis. You may have a physical exam, lab tests, and imaging tests to find the cause of your pain. Acute back pain usually goes away with rest and home care. Follow these instructions at home: Managing pain, stiffness, and swelling  Take over-the-counter and prescription medicines only as told by your health care provider.  Your health care provider may recommend applying ice during the first 24-48 hours after your pain starts. To do this: ? Put ice in a plastic bag. ? Place a towel between your skin and the bag. ? Leave the ice on for 20 minutes, 2-3 times a day.  If directed, apply heat to the affected area as often as told by your health care provider. Use the heat source that your health care provider recommends, such as  a moist heat pack or a heating pad. ? Place a towel between your  skin and the heat source. ? Leave the heat on for 20-30 minutes. ? Remove the heat if your skin turns bright red. This is especially important if you are unable to feel pain, heat, or cold. You have a greater risk of getting burned. Activity   Do not stay in bed. Staying in bed for more than 1-2 days can delay your recovery.  Sit up and stand up straight. Avoid leaning forward when you sit, or hunching over when you stand. ? If you work at a desk, sit close to it so you do not need to lean over. Keep your chin tucked in. Keep your neck drawn back, and keep your elbows bent at a right angle. Your arms should look like the letter "L." ? Sit high and close to the steering wheel when you drive. Add lower back (lumbar) support to your car seat, if needed.  Take short walks on even surfaces as soon as you are able. Try to increase the length of time you walk each day.  Do not sit, drive, or stand in one place for more than 30 minutes at a time. Sitting or standing for long periods of time can put stress on your back.  Do not drive or use heavy machinery while taking prescription pain medicine.  Use proper lifting techniques. When you bend and lift, use positions that put less stress on your back: ? [place] your knees. ? Keep the load close to your body. ? Avoid twisting.  Exercise regularly as told by your health care provider. Exercising helps your back heal faster and helps prevent back injuries by keeping muscles strong and flexible.  Work with a physical therapist to make a safe exercise program, as recommended by your health care provider. Do any exercises as told by your physical therapist. Lifestyle  Maintain a healthy weight. Extra weight puts stress on your back and makes it difficult to have good posture.  Avoid activities or situations that make you feel anxious or stressed. Stress and anxiety increase muscle tension and can make back pain worse. Learn ways to manage anxiety and  stress, such as through exercise. General instructions  Sleep on a firm mattress in a comfortable position. Try lying on your side with your knees slightly bent. If you lie on your back, put a pillow under your knees.  Follow your treatment plan as told by your health care provider. This may include: ? Cognitive or behavioral therapy. ? Acupuncture or massage therapy. ? Meditation or yoga. Contact a health care provider if:  You have pain that is not relieved with rest or medicine.  You have increasing pain going down into your legs or buttocks.  Your pain does not improve after 2 weeks.  You have pain at night.  You lose weight without trying.  You have a fever or chills. Get help right away if:  You develop new bowel or bladder control problems.  You have unusual weakness or numbness in your arms or legs.  You develop nausea or vomiting.  You develop abdominal pain.  You feel faint. Summary  Acute back pain is sudden and usually short-lived.  Use proper lifting techniques. When you bend and lift, use positions that put less stress on your back.  Take over-the-counter and prescription medicines and apply heat or ice as directed by your health care provider. This information is not intended to replace advice  given to you by your health care provider. Make sure you discuss any questions you have with your health care provider. Document Revised: 02/18/2019 Document Reviewed: 06/13/2017 Elsevier Patient Education  [employer].    If you have lab work done today you will be contacted with your lab results within the next 2 weeks.  If you have not heard from [place] then please contact us. The fastest way to get your results is to register for My Chart.   IF you received an x-ray today, you will receive an invoice from [facility]. Please contact [facility] at [phone] with questions or concerns regarding your invoice.   IF you received labwork  today, you will receive an invoice from [place]. Please contact [facility] at [phone] with questions or concerns regarding your invoice.   Our billing staff will not be able to assist you with questions regarding bills from these companies.  You will be contacted with the lab results as soon as they are available. The fastest way to get your results is to activate your My Chart account. Instructions are located on the last page of this paperwork. If you have not heard from [place] regarding the results in 2 weeks, please contact this office.         Signed, Merri Ray, MD [facility]

## 2019-12-05 LAB — URIC ACID: Uric Acid: 4.7 mg/dL (ref 3.8–8.4)

## 2019-12-09 ENCOUNTER — Telehealth: Payer: Self-pay | Admitting: Family Medicine

## 2019-12-09 NOTE — Telephone Encounter (Signed)
Pt was seen by Dr Carlota Raspberry on 12/04/19 for back pain. He has been out of work since the 21st. He would like a RTW note for 12/10/19. Please advise when ready, he would like to pick up and go to work on 12/10/19. [phone]

## 2019-12-10 ENCOUNTER — Encounter: Payer: Self-pay | Admitting: Family Medicine

## 2019-12-10 NOTE — Telephone Encounter (Signed)
Note provided - sent to mychart. Can print for signature if needed.

## 2019-12-11 NOTE — Telephone Encounter (Signed)
Patient was informed and letter has also been left at front desk for pick up

## 2020-01-15 ENCOUNTER — Other Ambulatory Visit: Payer: Self-pay | Admitting: Family Medicine

## 2020-01-15 DIAGNOSIS — Z8739 Personal history of other diseases of the musculoskeletal system and connective tissue: Secondary | ICD-10-CM

## 2020-01-15 NOTE — Telephone Encounter (Signed)
Requested Prescriptions  Pending Prescriptions Disp Refills  . indomethacin (INDOCIN) 25 MG capsule [Pharmacy Med Name: INDOMETHACIN 25 MG CAPSULE] 30 capsule 0    Sig: TAKE 1 CAPSULE (25 MG TOTAL) BY MOUTH 3 (THREE) TIMES DAILY WITH MEALS. DURING GOUT FLARE IF NEEDED.     Analgesics:  NSAIDS Passed - 01/15/2020 10:53 AM      Passed - Cr in normal range and within 360 days    Creatinine  Date Value Ref Range Status  04/18/2019 0.94 0.61 - 1.24 mg/dL Final   Creat  Date Value Ref Range Status  02/03/2016 0.97 0.70 - 1.33 mg/dL Final         Passed - HGB in normal range and within 360 days    Hemoglobin  Date Value Ref Range Status  04/18/2019 14.4 13.0 - 17.0 g/dL Final  04/01/2019 14.9 13.0 - 17.7 g/dL Final   HGB  Date Value Ref Range Status  10/12/2017 14.7 13.0 - 17.1 g/dL Final         Passed - Patient is not pregnant      Passed - Valid encounter within last 12 months    Recent Outpatient Visits          1 month ago Strain of lumbar region, initial encounter   [facility] [place], MD   8 months ago Hx of gout   [facility], Ranell Patrick, MD   9 months ago Toe pain, right   [facility] [place], MD   9 months ago Screening for hyperlipidemia   [facility], Ranell Patrick, MD   10 months ago Annual physical exam   [facility], Ranell Patrick, MD

## 2020-01-22 ENCOUNTER — Other Ambulatory Visit: Payer: Self-pay

## 2020-01-22 ENCOUNTER — Ambulatory Visit: Payer: 59 | Admitting: Family Medicine

## 2020-01-22 ENCOUNTER — Encounter: Payer: Self-pay | Admitting: Family Medicine

## 2020-01-22 VITALS — BP 122/74 | HR 62 | Temp 98.0°F | Ht 75.0 in | Wt 200.0 lb

## 2020-01-22 DIAGNOSIS — M109 Gout, unspecified: Secondary | ICD-10-CM | POA: Diagnosis not present

## 2020-01-22 MED ORDER — PREDNISONE 20 MG PO TABS: 40.0000 mg | ORAL_TABLET | Freq: Every day | ORAL | 0 refills | Status: DC

## 2020-01-22 NOTE — Patient Instructions (Addendum)
Prednisone for likely gout flare. Can restart colchicine every other day. If not improving into next week - return for recheck and possible Xray.  Let me know if we need to complete any papers to help get medication cheaper.   Return to the clinic or go to the nearest emergency room if any of your symptoms worsen or new symptoms occur.   Gout  Gout is a condition that causes painful swelling of the joints. Gout is a type of inflammation of the joints (arthritis). This condition is caused by having too much uric acid in the body. Uric acid is a chemical that forms when the body breaks down substances called purines. Purines are important for building body proteins. When the body has too much uric acid, sharp crystals can form and build up inside the joints. This causes pain and swelling. Gout attacks can happen quickly and may be very painful (acute gout). Over time, the attacks can affect more joints and become more frequent (chronic gout). Gout can also cause uric acid to build up under the skin and inside the kidneys. What are the causes? This condition is caused by too much uric acid in your blood. This can happen because:  Your kidneys do not remove enough uric acid from your blood. This is the most common cause.  Your body makes too much uric acid. This can happen with some cancers and cancer treatments. It can also occur if your body is breaking down too many red blood cells (hemolytic anemia).  You eat too many foods that are high in purines. These foods include organ meats and some seafood. Alcohol, especially beer, is also high in purines. A gout attack may be triggered by trauma or stress. What increases the risk? You are more likely to develop this condition if you:  Have a family history of gout.  Are male and middle-aged.  Are male and have gone through menopause.  Are obese.  Frequently drink alcohol, especially beer.  Are dehydrated.  Lose weight too  quickly.  Have an organ transplant.  Have lead poisoning.  Take certain medicines, including aspirin, cyclosporine, diuretics, levodopa, and niacin.  Have kidney disease.  Have a skin condition called psoriasis. What are the signs or symptoms? An attack of acute gout happens quickly. It usually occurs in just one joint. The most common place is the big toe. Attacks often start at night. Other joints that may be affected include joints of the feet, ankle, knee, fingers, wrist, or elbow. Symptoms of this condition may include:  Severe pain.  Warmth.  Swelling.  Stiffness.  Tenderness. The affected joint may be very painful to touch.  Shiny, red, or purple skin.  Chills and fever. Chronic gout may cause symptoms more frequently. More joints may be involved. You may also have white or yellow lumps (tophi) on your hands or feet or in other areas near your joints. How is this diagnosed? This condition is diagnosed based on your symptoms, medical history, and physical exam. You may have tests, such as:  Blood tests to measure uric acid levels.  Removal of joint fluid with a thin needle (aspiration) to look for uric acid crystals.  X-rays to look for joint damage. How is this treated? Treatment for this condition has two phases: treating an acute attack and preventing future attacks. Acute gout treatment may include medicines to reduce pain and swelling, including:  NSAIDs.  Steroids. These are strong anti-inflammatory medicines that can be taken by mouth (orally)  or injected into a joint.  Colchicine. This medicine relieves pain and swelling when it is taken soon after an attack. It can be given by mouth or through an IV. Preventive treatment may include:  Daily use of smaller doses of NSAIDs or colchicine.  Use of a medicine that reduces uric acid levels in your blood.  Changes to your diet. You may need to see a dietitian about what to eat and drink to prevent  gout. Follow these instructions at home: During a gout attack   If directed, put ice on the affected area: ? Put ice in a plastic bag. ? Place a towel between your skin and the bag. ? Leave the ice on for 20 minutes, 2-3 times a day.  Raise (elevate) the affected joint above the level of your heart as often as possible.  Rest the joint as much as possible. If the affected joint is in your leg, you may be given crutches to use.  Follow instructions from your health care provider about eating or drinking restrictions. Avoiding future gout attacks  Follow a low-purine diet as told by your dietitian or health care provider. Avoid foods and drinks that are high in purines, including liver, kidney, anchovies, asparagus, herring, mushrooms, mussels, and beer.  Maintain a healthy weight or lose weight if you are overweight. If you want to lose weight, talk with your health care provider. It is important that you do not lose weight too quickly.  Start or maintain an exercise program as told by your health care provider. Eating and drinking  Drink enough fluids to keep your urine pale yellow.  If you drink alcohol: ? Limit how much you use to:  0-1 drink a day for women.  0-2 drinks a day for men. ? Be aware of how much alcohol is in your drink. In the [place] one drink equals one 12 oz bottle of beer (355 mL) one 5 oz glass of wine (148 mL), or one 1 oz glass of hard liquor (44 mL). General instructions  Take over-the-counter and prescription medicines only as told by your health care provider.  Do not drive or use heavy machinery while taking prescription pain medicine.  Return to your normal activities as told by your health care provider. Ask your health care provider what activities are safe for you.  Keep all follow-up visits as told by your health care provider. This is important. Contact a health care provider if you have:  Another gout attack.  Continuing symptoms of a  gout attack after 10 days of treatment.  Side effects from your medicines.  Chills or a fever.  Burning pain when you urinate.  Pain in your lower back or belly. Get help right away if you:  Have severe or uncontrolled pain.  Cannot urinate. Summary  Gout is painful swelling of the joints caused by inflammation.  The most common site of pain is the big toe, but it can affect other joints in the body.  Medicines and dietary changes can help to prevent and treat gout attacks. This information is not intended to replace advice given to you by your health care provider. Make sure you discuss any questions you have with your health care provider. Document Revised: 05/22/2018 Document Reviewed: 05/22/2018 Elsevier Patient Education  [employer].    If you have lab work done today you will be contacted with your lab results within the next 2 weeks.  If you have not heard from [place] then  please contact us. The fastest way to get your results is to register for My Chart.   IF you received an x-ray today, you will receive an invoice from [facility]. Please contact [facility] at [phone] with questions or concerns regarding your invoice.   IF you received labwork today, you will receive an invoice from [place]. Please contact [facility] at [phone] with questions or concerns regarding your invoice.   Our billing staff will not be able to assist you with questions regarding bills from these companies.  You will be contacted with the lab results as soon as they are available. The fastest way to get your results is to activate your My Chart account. Instructions are located on the last page of this paperwork. If you have not heard from [place] regarding the results in 2 weeks, please contact this office.

## 2020-01-22 NOTE — Progress Notes (Signed)
Subjective:  Patient ID: Brendan Short, male    DOB: Jan 02, 1961  Age: 59 y.o. MRN: [id number]  CC:  Chief Complaint  Patient presents with  . Foot Pain    R foot is in pain. pain is located in the inner part of pt's foot around the bottom of his big toe. pt has tired his usual GOUT medication and that hasn't helped. pt has been just numbing the pain with BIO freeze to numb the pain until APPT.    HPI Brendan Short presents for  Gout: Daily meds: Probenecid 57m BID Prn med: Indomethacin, colchicine - cost prohibitive - not used recently - had been doing well with every other day.  Lab Results  Component Value Date   LABURIC 4.7 12/04/2019   Currently presented with right foot pain of the right great toe/plantar aspect.  Started 8 days ago at work - no injury - just sore, some warmth and swelling. Similar area in past with gout.  No change in diet or recent alcohol. No shoe or activity change.  Tried indomethacin 3 times per day for 4 days, ice, epsom soak. Min change.  Felt worse past few days - trying biofreeze.  No fever, no wounds.  Last prednisone for back issue in January.  Prior gout attack about a month ago.  2 beers that night.   History Patient Active Problem List   Diagnosis Date Noted  . Right elbow pain 12/19/2018  . [place] 03/14/[place]  . Thrombocytopenia ([facility] 03/14/2016  . Elevated PSA 03/20/2014  . Gout 06/11/2012   Past Medical History:  Diagnosis Date  . Allergy   . [place] ([place] 03/14[place]2016  . Heart murmur   . [place]ocytopenia ([facility] 03/14/2016   Past Surgical History:  Procedure Laterality Date  . broken nose  1970  . broken nose surgery, in the early 70"s    . HERNIA REPAIR     Allergies  Allergen Reactions  . Allopurinol    Prior to Admission medications   Medication Sig Start Date End Date Taking? Authorizing Provider  cholecalciferol (VITAMIN D) 1000 UNITS tablet Take 1,000 Units by mouth daily.   Yes  [provider]  colchicine (COLCRYS) 0.6 MG tablet TAKE 1 TABLET BY MOUTH EVERY OTHER DAY 03/17/19  Yes GWendie Agreste MD  cyclobenzaprine (FLEXERIL) 5 MG tablet 1 pill by mouth up to every 8 hours as needed. Start with one pill by mouth each bedtime as needed due to sedation 12/04/19  Yes GWendie Agreste MD  diclofenac (VOLTAREN) 0.1 % ophthalmic solution 4 (four) times daily. PRN   Yes [provider]  glucosamine-chondroitin 500-400 MG tablet Take 1 tablet by mouth 3 (three) times daily.   Yes [provider]  indomethacin (INDOCIN) 25 MG capsule TAKE 1 CAPSULE (25 MG TOTAL) BY MOUTH 3 (THREE) TIMES DAILY WITH MEALS. DURING GOUT FLARE IF NEEDED. 01/15/20  Yes GWendie Agreste MD  predniSONE (DELTASONE) 20 MG tablet Take 2 tablets (40 mg total) by mouth daily with breakfast. 12/04/19  Yes GWendie Agreste MD  probenecid (BENEMID) 500 MG tablet TAKE 1 TABLET BY MOUTH TWICE A DAY 12/04/19  Yes GWendie Agreste MD  Turmeric 500 MG TABS Take by mouth.   Yes [provider]   Social History   Socioeconomic History  . Marital status: Married    Spouse name: Not on file  . Number of children: 0  . Years of education: Not on file  .  Highest education level: Not on file  Occupational History  . Occupation: Landscaper  Tobacco Use  . Smoking status: Never Smoker  . Smokeless tobacco: Never Used  Substance and Sexual Activity  . Alcohol use: Yes    Alcohol/week: 0.0 standard drinks    Comment: few beers  . Drug use: No  . Sexual activity: Yes  Other Topics Concern  . Not on file  Social History Narrative   Married   Education: Secretary/administrator   Exercise: Yes   Social Determinants of Health   Financial Resource Strain:   . Difficulty of Paying Living Expenses:   Food Insecurity:   . Worried About Charity fundraiser in the Last Year:   . Arboriculturist in the Last Year:   Transportation Needs:   . Film/video editor (Medical):   Marland Kitchen Lack of  Transportation (Non-Medical):   Physical Activity:   . Days of Exercise per Week:   . Minutes of Exercise per Session:   Stress:   . Feeling of Stress :   Social Connections:   . Frequency of Communication with Friends and Family:   . Frequency of Social Gatherings with Friends and Family:   . Attends Religious Services:   . Active Member of Clubs or Organizations:   . Attends Archivist Meetings:   Marland Kitchen Marital Status:   Intimate Partner Violence:   . Fear of Current or Ex-Partner:   . Emotionally Abused:   Marland Kitchen Physically Abused:   . Sexually Abused:     Review of Systems  Per HPI.   Objective:   Vitals:   01/22/20 1329  BP: 122/74  Pulse: 62  Temp: 98 F (36.7 C)  TempSrc: Temporal  SpO2: 98%  Weight: 200 lb (90.7 kg)  Height: 6' 3"  (1.905 m)    Physical Exam Vitals reviewed.  Constitutional:      General: He is not in acute distress.    Appearance: He is well-developed.  HENT:     Head: Normocephalic and atraumatic.  Cardiovascular:     Rate and Rhythm: Normal rate.  Pulmonary:     Effort: Pulmonary effort is normal.  Musculoskeletal:       Legs:  Neurological:     Mental Status: He is alert and oriented to person, place, and time.        Assessment & Plan:  Brendan Short is a 59 y.o. male . Gout involving toe of right foot, unspecified cause, unspecified chronicity - Plan: predniSONE (DELTASONE) 20 MG tablet  -Based on exam, symptoms, suspected podagra, gout flare at right MTP.  Over 1 week since onset of symptoms, doubt NSAID or colchicine improvement at this time.  Short course of prednisone prescribed with potential side effects and risks, plans to restart colchicine every other day as symptoms/flares much less frequent.  Possibly cost prohibitive, he will check into patient assistance program with company, discount cards given if those apply.  RTC precautions if not improving next week for possible xray.   No orders of the defined types  were placed in this encounter.  Patient Instructions       If you have lab work done today you will be contacted with your lab results within the next 2 weeks.  If you have not heard from [place] then please contact us. The fastest way to get your results is to register for My Chart.   IF you received an x-ray today, you will receive an invoice from  [facility]. Please contact [facility] Radiology at [phone] with questions or concerns regarding your invoice.   IF you received labwork today, you will receive an invoice from [place]. Please contact [facility] at [phone] with questions or concerns regarding your invoice.   Our billing staff will not be able to assist you with questions regarding bills from these companies.  You will be contacted with the lab results as soon as they are available. The fastest way to get your results is to activate your My Chart account. Instructions are located on the last page of this paperwork. If you have not heard from [place] regarding the results in 2 weeks, please contact this office.         Signed, Merri Ray, MD [facility]

## 2020-03-22 ENCOUNTER — Other Ambulatory Visit: Payer: Self-pay

## 2020-03-22 ENCOUNTER — Telehealth: Payer: Self-pay | Admitting: Family Medicine

## 2020-03-22 DIAGNOSIS — R739 Hyperglycemia, unspecified: Secondary | ICD-10-CM

## 2020-03-22 DIAGNOSIS — Z131 Encounter for screening for diabetes mellitus: Secondary | ICD-10-CM

## 2020-03-22 DIAGNOSIS — R972 Elevated prostate specific antigen [PSA]: Secondary | ICD-10-CM

## 2020-03-22 DIAGNOSIS — Z13220 Encounter for screening for lipoid disorders: Secondary | ICD-10-CM

## 2020-03-22 NOTE — Telephone Encounter (Signed)
Pt has a cpe scheduled for 04/01/20 and his labs are scheduled for 03/26/20. Please put orders in .

## 2020-03-26 ENCOUNTER — Other Ambulatory Visit: Payer: Self-pay

## 2020-03-26 ENCOUNTER — Ambulatory Visit (INDEPENDENT_AMBULATORY_CARE_PROVIDER_SITE_OTHER): Payer: 59 | Admitting: Family Medicine

## 2020-03-26 DIAGNOSIS — Z131 Encounter for screening for diabetes mellitus: Secondary | ICD-10-CM

## 2020-03-26 DIAGNOSIS — Z13220 Encounter for screening for lipoid disorders: Secondary | ICD-10-CM

## 2020-03-26 DIAGNOSIS — R972 Elevated prostate specific antigen [PSA]: Secondary | ICD-10-CM

## 2020-03-26 NOTE — Progress Notes (Signed)
Lab visit only

## 2020-03-27 LAB — LIPID PANEL
Chol/HDL Ratio: 3.6 ratio (ref 0.0–5.0)
Cholesterol, Total: 167 mg/dL (ref 100–199)
HDL: 47 mg/dL (ref >39)
LDL Chol Calc (NIH): 103 mg/dL — ABNORMAL HIGH (ref 0–99)
Triglycerides: 92 mg/dL (ref 0–149)
VLDL Cholesterol Cal: 17 mg/dL (ref 5–40)

## 2020-03-27 LAB — CMP14+EGFR
ALT: 19 IU/L (ref 0–44)
AST: 25 IU/L (ref 0–40)
Albumin/Globulin Ratio: 2.2 (ref 1.2–2.2)
Albumin: 4.6 g/dL (ref 3.8–4.9)
Alkaline Phosphatase: 62 IU/L (ref 39–117)
BUN/Creatinine Ratio: 20 (ref 10–24)
BUN: 21 mg/dL (ref 8–27)
Bilirubin Total: 0.7 mg/dL (ref 0.0–1.2)
CO2: 22 mmol/L (ref 20–29)
Calcium: 9.1 mg/dL (ref 8.6–10.2)
Chloride: 103 mmol/L (ref 96–106)
Creatinine, Ser: 1.07 mg/dL (ref 0.76–1.27)
GFR calc Af Amer: 87 mL/min/{1.73_m2} (ref >59)
GFR calc non Af Amer: 75 mL/min/{1.73_m2} (ref >59)
Globulin, Total: 2.1 g/dL (ref 1.5–4.5)
Glucose: 107 mg/dL — ABNORMAL HIGH (ref 65–99)
Potassium: 4.5 mmol/L (ref 3.5–5.2)
Sodium: 140 mmol/L (ref 134–144)
Total Protein: 6.7 g/dL (ref 6.0–8.5)

## 2020-03-27 LAB — HEMOGLOBIN A1C
Est. average glucose Bld gHb Est-mCnc: 111 mg/dL
Hgb A1c MFr Bld: 5.5 % (ref 4.8–5.6)

## 2020-03-27 LAB — PSA: Prostate Specific Ag, Serum: 4.1 ng/mL — ABNORMAL HIGH (ref 0.0–4.0)

## 2020-03-27 NOTE — Progress Notes (Signed)
Lab only visit 

## 2020-04-01 ENCOUNTER — Encounter: Payer: Self-pay | Admitting: Family Medicine

## 2020-04-01 ENCOUNTER — Ambulatory Visit (INDEPENDENT_AMBULATORY_CARE_PROVIDER_SITE_OTHER): Payer: 59 | Admitting: Family Medicine

## 2020-04-01 ENCOUNTER — Other Ambulatory Visit: Payer: Self-pay

## 2020-04-01 VITALS — BP 118/77 | HR 62 | Temp 98.0°F | Ht 75.0 in | Wt 204.0 lb

## 2020-04-01 DIAGNOSIS — M109 Gout, unspecified: Secondary | ICD-10-CM

## 2020-04-01 DIAGNOSIS — S39012A Strain of muscle, fascia and tendon of lower back, initial encounter: Secondary | ICD-10-CM

## 2020-04-01 DIAGNOSIS — Z0000 Encounter for general adult medical examination without abnormal findings: Secondary | ICD-10-CM

## 2020-04-01 DIAGNOSIS — Z0001 Encounter for general adult medical examination with abnormal findings: Secondary | ICD-10-CM

## 2020-04-01 DIAGNOSIS — H1013 Acute atopic conjunctivitis, bilateral: Secondary | ICD-10-CM

## 2020-04-01 DIAGNOSIS — Z8739 Personal history of other diseases of the musculoskeletal system and connective tissue: Secondary | ICD-10-CM

## 2020-04-01 DIAGNOSIS — Z23 Encounter for immunization: Secondary | ICD-10-CM | POA: Diagnosis not present

## 2020-04-01 MED ORDER — COLCHICINE 0.6 MG PO TABS: ORAL_TABLET | ORAL | 3 refills | Status: DC

## 2020-04-01 MED ORDER — CYCLOBENZAPRINE HCL 5 MG PO TABS: ORAL_TABLET | ORAL | 1 refills | Status: DC

## 2020-04-01 MED ORDER — AZELASTINE HCL 0.05 % OP SOLN: 1.0000 [drp] | Freq: Two times a day (BID) | OPHTHALMIC | 12 refills | Status: AC

## 2020-04-01 NOTE — Patient Instructions (Addendum)
Call ophthalmologist for appointment.  Pataday drops over the counter OR optivar that I prescribed for now.  Flexeril if needed for back, indomethacin if needed. follow up with Dr. Ronnald Ramp if worsening pain.  continue colchicine every other day.  Continue follow up with urologist.  No concern with borderline sugar and cholesterol- keep active and can repeat test in 6 months to a year.   I do recommend covid vaccine. Let me know if there are further questions.   COVID-19 Vaccine Information can be found at: ShippingScam.co.uk For questions related to vaccine distribution or appointments, please email vaccine@[place] .com or call [phone].   Keeping you healthy  Get these tests  Blood pressure- Have your blood pressure checked once a year by your healthcare provider.  Normal blood pressure is 120/80  Weight- Have your body mass index (BMI) calculated to screen for obesity.  BMI is a measure of body fat based on height and weight. You can also calculate your own BMI at ViewBanking.si.  Cholesterol- Have your cholesterol checked every year.  Diabetes- Have your blood sugar checked regularly if you have high blood pressure, high cholesterol, have a family history of diabetes or if you are overweight.  Screening for Colon Cancer- Colonoscopy starting at age 71.  Screening may begin sooner depending on your family history and other health conditions. Follow up colonoscopy as directed by your Gastroenterologist.  Screening for Prostate Cancer- Both blood work (PSA) and a rectal exam help screen for Prostate Cancer.  Screening begins at age 67 with African-American men and at age 76 with Caucasian men.  Screening may begin sooner depending on your family history.  Take these medicines  Aspirin- One aspirin daily can help prevent Heart disease and Stroke.  Flu shot- Every fall.  Tetanus- Every 10 years.  Zostavax-  Once after the age of 70 to prevent Shingles.  Pneumonia shot- Once after the age of 58; if you are younger than 67, ask your healthcare provider if you need a Pneumonia shot.  Take these steps  Don't smoke- If you do smoke, talk to your doctor about quitting.  For tips on how to quit, go to www.smokefree.gov or call 1-800-QUIT-NOW.  Be physically active- Exercise 5 days a week for at least 30 minutes.  If you are not already physically active start slow and gradually work up to 30 minutes of moderate physical activity.  Examples of moderate activity include walking briskly, mowing the yard, dancing, swimming, bicycling, etc.  Eat a healthy diet- Eat a variety of healthy food such as fruits, vegetables, low fat milk, low fat cheese, yogurt, lean meant, poultry, fish, beans, tofu, etc. For more information go to www.thenutritionsource.org  Drink alcohol in moderation- Limit alcohol intake to less than two drinks a day. Never drink and drive.  Dentist- Brush and floss twice daily; visit your dentist twice a year.  Depression- Your emotional health is as important as your physical health. If you're feeling down, or losing interest in things you would normally enjoy please talk to your healthcare provider.  Eye exam- Visit your eye doctor every year.  Safe sex- If you may be exposed to a sexually transmitted infection, use a condom.  Seat belts- Seat belts can save your life; always wear one.  Smoke/Carbon Monoxide detectors- These detectors need to be installed on the appropriate level of your home.  Replace batteries at least once a year.  Skin cancer- When out in the sun, cover up and use sunscreen 15 SPF  or higher.  Violence- If anyone is threatening you, please tell your healthcare provider.  Living Will/ Health care power of attorney- Speak with your healthcare provider and family.  If you have lab work done today you will be contacted with your lab results within the next 2 weeks.   If you have not heard from [place] then please contact us. The fastest way to get your results is to register for My Chart.   IF you received an x-ray today, you will receive an invoice from [facility] Radiology. Please contact [facility] Radiology at [phone] with questions or concerns regarding your invoice.   IF you received labwork today, you will receive an invoice from [place]. Please contact [facility] at [phone] with questions or concerns regarding your invoice.   Our billing staff will not be able to assist you with questions regarding bills from these companies.  You will be contacted with the lab results as soon as they are available. The fastest way to get your results is to activate your My Chart account. Instructions are located on the last page of this paperwork. If you have not heard from [place] regarding the results in 2 weeks, please contact this office.

## 2020-04-01 NOTE — Progress Notes (Signed)
Subjective:  Patient ID: Brendan Short, male    DOB: May 29, 1961  Age: 59 y.o. MRN: [id number]  CC:  Chief Complaint  Patient presents with  . Annual Exam  . Back Pain    lower back intermitently/ flexeril  rx   . Gout    cholcrys rx   . Allergies    eye redness, light sensitive    HPI [name] presents for  Annual exam, other concerns above.   Gout: Last flare: Right foot in March.  Treated with indomethacin, then prednisone. Daily meds: Probenecid BID Prn med: Indomethacin, colchicine -recommended every other day restart at March flare. Has been doing better with restarting every day- no flare since, even with few beers, steak.  Lab Results  Component Value Date   LABURIC 4.7 12/04/2019   Low back pain History of chronic/recurrent low back pain.  Treated for strain in January with cyclobenzaprine, indomethacin.  Previously treated by neurosurgery, Dr. 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  Activity modification, exercise therapy, yoga. No surgery.  Physical work.  Trouble getting comfortable with driving more than 84-13 min. Sore with long car rides. Doing ok if stays active. Flexeril - only as needed after hard day at work - once per week or so.   Allergic Rhinitis: No current meds.  Has been having some more eye redness, bright light affects at times, but able to work outside - wearing sunglasses. Prior allergy shots. Wearing mask has helped except eye symptoms still there. Treated with otc allergy drops few times per day helps.  No blurry/vision changes.  Only during pollen season.  Cancer screening Colonoscopy 07/02/2014, repeat fall of last year - Dr. Benson Norway. Few polyps. Every 5 yrs.  Screening PSA for visit today is slightly higher at 4.1 than previous reading 2015 at 3.17 at that time. Followed by urology - Dr. [name]. Had intercourse morning prior to PSA draw. Had been doing well at urology - numbers stable.  Lab Results  Component Value Date   PSA1  4.1 (H) 03/26/2020   PSA 3.17 02/10/2014   PSA 1.99 06/11/2012   Immunization History  Administered Date(s) Administered  . Influenza,inj,Quad PF,6+ Mos 09/03/2019  . Influenza-Unspecified 10/14/2015  . Td 04/01/2020  . Tdap 02/01/2010  . Zoster 11/13/2014  covid vaccine: mixed thoughts.   Depression screen [facility] 2/9 04/01/2020 01/22/2020 12/04/2019 04/25/2019 04/18/2019  Decreased Interest 0 0 0 0 0  Down, Depressed, Hopeless 0 0 0 0 0  PHQ - 2 Score 0 0 0 0 0    Hearing Screening   125Hz  250Hz  500Hz  1000Hz  2000Hz  3000Hz  4000Hz  6000Hz  8000Hz   Right ear:           Left ear:             Visual Acuity Screening   Right eye Left eye Both eyes  Without correction:     With correction: 2020 2020 2020  optho - [place]. Every other year. Due.   Dental: Every 6 months. appt 6/2.   Exercise: Physically active outside job, and walking. walking for golf. 2-5 miles on weekend walking and hiking.   Hyperglycemia:  Glucose 107 on 5/14 labs, normal A1c,  Lab Results  Component Value Date   HGBA1C 5.5 03/26/2020   The 10-year ASCVD risk score Mikey Bussing DC Jr., et al., 2013) is: 6.7%   Values used to calculate the score:     Age: 41 years     Sex: Male  Is Non-Hispanic African American: No     Diabetic: No     Tobacco smoker: No     Systolic Blood Pressure: 300 mmHg     Is BP treated: No     HDL Cholesterol: 47 mg/dL     Total Cholesterol: 167 mg/dL Lab Results  Component Value Date   CHOL 167 03/26/2020   HDL 47 03/26/2020   LDLCALC 103 (H) 03/26/2020   TRIG 92 03/26/2020   CHOLHDL 3.6 03/26/2020     History Patient Active Problem List   Diagnosis Date Noted  . Right elbow pain 12/19/2018  . 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 Thrombocytopenia ([place]) 03/14/2016  . Elevated PSA 03/20/2014  . Gout 06/11/2012   Past Medical History:  Diagnosis Date  . Allergy   . 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 Gout   . Heart murmur   . Lumbar strain   .  Thrombocytopenia ([place]) 03/14/2016   Past Surgical History:  Procedure Laterality Date  . broken nose  1970  . broken nose surgery, in the early 70"s    . HERNIA REPAIR     Allergies  Allergen Reactions  . Allopurinol    Prior to Admission medications   Medication Sig Start Date End Date Taking? Authorizing Provider  cholecalciferol (VITAMIN D) 1000 UNITS tablet Take 1,000 Units by mouth daily.   Yes [provider]  colchicine (COLCRYS) 0.6 MG tablet TAKE 1 TABLET BY MOUTH EVERY OTHER DAY 03/17/19  Yes Wendie Agreste, MD  cyclobenzaprine (FLEXERIL) 5 MG tablet 1 pill by mouth up to every 8 hours as needed. Start with one pill by mouth each bedtime as needed due to sedation 12/04/19  Yes Wendie Agreste, MD  diclofenac (VOLTAREN) 0.1 % ophthalmic solution 4 (four) times daily. PRN   Yes [provider]  glucosamine-chondroitin 500-400 MG tablet Take 1 tablet by mouth 3 (three) times daily.   Yes [provider]  indomethacin (INDOCIN) 25 MG capsule TAKE 1 CAPSULE (25 MG TOTAL) BY MOUTH 3 (THREE) TIMES DAILY WITH MEALS. DURING GOUT FLARE IF NEEDED. 01/15/20  Yes Wendie Agreste, MD  probenecid (BENEMID) 500 MG tablet TAKE 1 TABLET BY MOUTH TWICE A DAY 12/04/19  Yes Wendie Agreste, MD  Turmeric 500 MG TABS Take by mouth.   Yes [provider]  ketoconazole (NIZORAL) 2 % cream Apply 1 application topically 2 (two) times daily. 02/25/20   [provider]  predniSONE (DELTASONE) 20 MG tablet Take 2 tablets (40 mg total) by mouth daily with breakfast. Patient not taking: Reported on 04/01/2020 01/22/20   Wendie Agreste, MD   Social History   Socioeconomic History  . Marital status: Married    Spouse name: Not on file  . Number of children: 0  . Years of education: Not on file  . Highest education level: Not on file  Occupational History  . Occupation: Landscaper  Tobacco Use  . Smoking status: Never Smoker  . Smokeless tobacco: Never Used    Substance and Sexual Activity  . Alcohol use: Yes    Alcohol/week: 0.0 standard drinks    Comment: few beers  . Drug use: No  . Sexual activity: Yes  Other Topics Concern  . Not on file  Social History Narrative   Married   Education: Secretary/administrator   Exercise: Yes   Social Determinants of Health   Financial Resource Strain:   . Difficulty of Paying Living Expenses:   Food Insecurity:   .  Worried About Charity fundraiser in the Last Year:   . Arboriculturist in the Last Year:   Transportation Needs:   . Film/video editor (Medical):   Marland Kitchen Lack of Transportation (Non-Medical):   Physical Activity:   . Days of Exercise per Week:   . Minutes of Exercise per Session:   Stress:   . Feeling of Stress :   Social Connections:   . Frequency of Communication with Friends and Family:   . Frequency of Social Gatherings with Friends and Family:   . Attends Religious Services:   . Active Member of Clubs or Organizations:   . Attends Archivist Meetings:   Marland Kitchen Marital Status:   Intimate Partner Violence:   . Fear of Current or Ex-Partner:   . Emotionally Abused:   Marland Kitchen Physically Abused:   . Sexually Abused:     Review of Systems 13 point review of systems per patient health survey noted.  Negative other than as indicated above or in HPI.    Objective:   Vitals:   04/01/20 1037  BP: 118/77  Pulse: 62  Temp: 98 F (36.7 C)  SpO2: 96%  Weight: 204 lb (92.5 kg)  Height: 6' 3"  (1.905 m)     Physical Exam Vitals reviewed.  Constitutional:      Appearance: He is well-developed.  HENT:     Head: Normocephalic and atraumatic.     Right Ear: External ear normal.     Left Ear: External ear normal.  Eyes:     Conjunctiva/sclera: Conjunctivae normal.     Pupils: Pupils are equal, round, and reactive to light.  Neck:     Thyroid: No thyromegaly.  Cardiovascular:     Rate and Rhythm: Normal rate and regular rhythm.     Heart sounds: Normal heart sounds.  Pulmonary:      Effort: Pulmonary effort is normal. No respiratory distress.     Breath sounds: Normal breath sounds. No wheezing.  Abdominal:     General: There is no distension.     Palpations: Abdomen is soft.     Tenderness: There is no abdominal tenderness.     Hernia: A hernia is present.  Musculoskeletal:        General: No tenderness. Normal range of motion.     Cervical back: Normal range of motion and neck supple.  Lymphadenopathy:     Cervical: No cervical adenopathy.  Skin:    General: Skin is warm and dry.  Neurological:     Mental Status: He is alert and oriented to person, place, and time.     Deep Tendon Reflexes: Reflexes are normal and symmetric.  Psychiatric:        Behavior: Behavior normal.        Assessment & Plan:  MACIEJ SCHWEITZER is a 59 y.o. male . Annual physical exam  - -anticipatory guidance as below in AVS, screening labs above. Health maintenance items as above in HPI discussed/recommended as applicable.   Hx of gout - Plan: colchicine (COLCRYS) 0.6 MG tablet Gout, unspecified cause, unspecified chronicity, unspecified site - Plan: colchicine (COLCRYS) 0.6 MG tablet  -Continue probenecid, Colcrys every other day which has been helpful to prevent flares, most recent uric acid looked okay.  Strain of lumbar region, initial encounter - Plan: cyclobenzaprine (FLEXERIL) 5 MG tablet  -Intermittent flare of low back pain, stable with intermittent Flexeril.  History of herniated disc but has been able to manage without surgery.  RTC precautions, neurosurgery follow-up precautions.  Allergic conjunctivitis of both eyes - Plan: azelastine (OPTIVAR) 0.05 % ophthalmic solution  -Trial of Optivar versus Pataday over-the-counter.  Follow-up with ophthalmology recommended to discuss light sensitivity but likely due to allergic symptoms.  Need for Td vaccine - Plan: Td vaccine greater than or equal to 7yo preservative free IM  Borderline hyperglycemia, elevated LDL  discussed, not concerning at those readings including with normal A1c.  Counseled regarding Covid vaccine and recommended.  All questions were answered.  Slight elevation of PSA may have been related to intercourse prior to testing.  Has routine follow-up with urology. Meds ordered this encounter  Medications  . colchicine (COLCRYS) 0.6 MG tablet    Sig: TAKE 1 TABLET BY MOUTH EVERY OTHER DAY    Dispense:  45 tablet    Refill:  3  . cyclobenzaprine (FLEXERIL) 5 MG tablet    Sig: 1 pill by mouth up to every 8 hours as needed. Start with one pill by mouth each bedtime as needed due to sedation    Dispense:  15 tablet    Refill:  1  . azelastine (OPTIVAR) 0.05 % ophthalmic solution    Sig: Place 1 drop into both eyes 2 (two) times daily.    Dispense:  6 mL    Refill:  12   Patient Instructions     Call ophthalmologist for appointment.  Pataday drops over the counter OR optivar that I prescribed for now.  Flexeril if needed for back, indomethacin if needed. follow up with Dr. Ronnald Ramp if worsening pain.  continue colchicine every other day.  Continue follow up with urologist.  No concern with borderline sugar and cholesterol- keep active and can repeat test in 6 months to a year.   I do recommend covid vaccine. Let me know if there are further questions.   COVID-19 Vaccine Information can be found at: ShippingScam.co.uk For questions related to vaccine distribution or appointments, please email [email or web address] or call [phone].   Keeping you healthy  Get these tests  Blood pressure- Have your blood pressure checked once a year by your healthcare provider.  Normal blood pressure is 120/80  Weight- Have your body mass index (BMI) calculated to screen for obesity.  BMI is a measure of body fat based on height and weight. You can also calculate your own BMI at ViewBanking.si.  Cholesterol- Have your  cholesterol checked every year.  Diabetes- Have your blood sugar checked regularly if you have high blood pressure, high cholesterol, have a family history of diabetes or if you are overweight.  Screening for Colon Cancer- Colonoscopy starting at age 70.  Screening may begin sooner depending on your family history and other health conditions. Follow up colonoscopy as directed by your Gastroenterologist.  Screening for Prostate Cancer- Both blood work (PSA) and a rectal exam help screen for Prostate Cancer.  Screening begins at age 84 with African-American men and at age 91 with Caucasian men.  Screening may begin sooner depending on your family history.  Take these medicines  Aspirin- One aspirin daily can help prevent Heart disease and Stroke.  Flu shot- Every fall.  Tetanus- Every 10 years.  Zostavax- Once after the age of 90 to prevent Shingles.  Pneumonia shot- Once after the age of 73; if you are younger than 9, ask your healthcare provider if you need a Pneumonia shot.  Take these steps  Don't smoke- If you do smoke, talk to your doctor about quitting.  For tips  on how to quit, go to www.smokefree.gov or call 1-800-QUIT-NOW.  Be physically active- Exercise 5 days a week for at least 30 minutes.  If you are not already physically active start slow and gradually work up to 30 minutes of moderate physical activity.  Examples of moderate activity include walking briskly, mowing the yard, dancing, swimming, bicycling, etc.  Eat a healthy diet- Eat a variety of healthy food such as fruits, vegetables, low fat milk, low fat cheese, yogurt, lean meant, poultry, fish, beans, tofu, etc. For more information go to www.thenutritionsource.org  Drink alcohol in moderation- Limit alcohol intake to less than two drinks a day. Never drink and drive.  Dentist- Brush and floss twice daily; visit your dentist twice a year.  Depression- Your emotional health is as important as your physical health.  If you're feeling down, or losing interest in things you would normally enjoy please talk to your healthcare provider.  Eye exam- Visit your eye doctor every year.  Safe sex- If you may be exposed to a sexually transmitted infection, use a condom.  Seat belts- Seat belts can save your life; always wear one.  Smoke/Carbon Monoxide detectors- These detectors need to be installed on the appropriate level of your home.  Replace batteries at least once a year.  Skin cancer- When out in the sun, cover up and use sunscreen 15 SPF or higher.  Violence- If anyone is threatening you, please tell your healthcare provider.  Living Will/ Health care power of attorney- Speak with your healthcare provider and family.  If you have lab work done today you will be contacted with your lab results within the next 2 weeks.  If you have not heard from [place] then please contact us. The fastest way to get your results is to register for My Chart.   IF you received an x-ray today, you will receive an invoice from [facility]. Please contact [facility] at [phone] with questions or concerns regarding your invoice.   IF you received labwork today, you will receive an invoice from [facility]. Please contact [facility] at [phone] with questions or concerns regarding your invoice.   Our billing staff will not be able to assist you with questions regarding bills from these companies.  You will be contacted with the lab results as soon as they are available. The fastest way to get your results is to activate your My Chart account. Instructions are located on the last page of this paperwork. If you have not heard from [place] regarding the results in 2 weeks, please contact this office.          Signed, Merri Ray, MD [facility]

## 2020-05-27 ENCOUNTER — Other Ambulatory Visit: Payer: Self-pay | Admitting: Family Medicine

## 2020-05-27 DIAGNOSIS — Z8739 Personal history of other diseases of the musculoskeletal system and connective tissue: Secondary | ICD-10-CM

## 2020-05-27 DIAGNOSIS — M109 Gout, unspecified: Secondary | ICD-10-CM

## 2020-09-06 LAB — PSA: PSA: 2.63

## 2020-09-25 ENCOUNTER — Other Ambulatory Visit: Payer: Self-pay | Admitting: Family Medicine

## 2020-09-25 DIAGNOSIS — Z8739 Personal history of other diseases of the musculoskeletal system and connective tissue: Secondary | ICD-10-CM

## 2020-09-25 NOTE — Telephone Encounter (Signed)
Requested Prescriptions  Pending Prescriptions Disp Refills  . indomethacin (INDOCIN) 25 MG capsule [Pharmacy Med Name: INDOMETHACIN 25 MG CAPSULE] 30 capsule 0    Sig: TAKE 1 CAPSULE BY MOUTH 3 TIMES DAILY WITH MEALS. USE DURING GOUT FLARE IF NEEDED.     Analgesics:  NSAIDS Failed - 09/25/2020 10:30 AM      Failed - HGB in normal range and within 360 days    Hemoglobin  Date Value Ref Range Status  04/18/2019 14.4 13.0 - 17.0 g/dL Final  04/01/2019 14.9 13.0 - 17.7 g/dL Final   HGB  Date Value Ref Range Status  10/12/2017 14.7 13.0 - 17.1 g/dL Final         Passed - Cr in normal range and within 360 days    Creatinine  Date Value Ref Range Status  04/18/2019 0.94 0.61 - 1.24 mg/dL Final   Creat  Date Value Ref Range Status  02/03/2016 0.97 0.70 - 1.33 mg/dL Final   Creatinine, Ser  Date Value Ref Range Status  03/26/2020 1.07 0.76 - 1.27 mg/dL Final         Passed - Patient is not pregnant      Passed - Valid encounter within last 12 months    Recent Outpatient Visits          5 months ago Annual physical exam   [facility], Ranell Patrick, MD   6 months ago Screening for hyperlipidemia   [facility], Ranell Patrick, MD   8 months ago Gout involving toe of right foot, unspecified cause, unspecified chronicity   [facility], Ranell Patrick, MD   9 months ago Strain of lumbar region, initial encounter   [facility], Ranell Patrick, MD   1 year ago Hx of gout   [facility], Ranell Patrick, MD      Future Appointments            In 6 days Wendie Agreste, MD [facility] [place], [facility]

## 2020-10-01 ENCOUNTER — Encounter: Payer: Self-pay | Admitting: Family Medicine

## 2020-10-01 ENCOUNTER — Other Ambulatory Visit: Payer: Self-pay

## 2020-10-01 ENCOUNTER — Ambulatory Visit: Payer: 59 | Admitting: Family Medicine

## 2020-10-01 VITALS — BP 124/80 | HR 60 | Temp 98.3°F | Ht 75.0 in | Wt 208.0 lb

## 2020-10-01 DIAGNOSIS — M79671 Pain in right foot: Secondary | ICD-10-CM | POA: Diagnosis not present

## 2020-10-01 DIAGNOSIS — E7800 Pure hypercholesterolemia, unspecified: Secondary | ICD-10-CM | POA: Diagnosis not present

## 2020-10-01 DIAGNOSIS — R972 Elevated prostate specific antigen [PSA]: Secondary | ICD-10-CM

## 2020-10-01 DIAGNOSIS — R739 Hyperglycemia, unspecified: Secondary | ICD-10-CM

## 2020-10-01 DIAGNOSIS — M722 Plantar fascial fibromatosis: Secondary | ICD-10-CM

## 2020-10-01 NOTE — Progress Notes (Signed)
Subjective:  Patient ID: Brendan Short, male    DOB: August 24, 1961  Age: 59 y.o. MRN: [id number]  CC:  Chief Complaint  Patient presents with  . Follow-up    on hyperglysimia and labs from pt's physical. pt reports he dosen't eat any thing with prosessed sugar in it.   . Foot Pain    pt reports having pain in his R heel. PT states he isn't sure if he needs better foot ware or if arthritis is setting in pt also wonders if he has a bone sper on his heel. PT wants the provider thoughts on if he needs to get specalty shoes or not.     HPI Brendan Short presents for   Hyperglycemia,  Glucose 107 on physical May 14.  A1c normal at that time.  Has been watching his diet and avoiding processed foods.  Wt Readings from Last 3 Encounters:  10/01/20 208 lb (94.3 kg)  04/01/20 204 lb (92.5 kg)  01/22/20 200 lb (90.7 kg)   Lab Results  Component Value Date   HGBA1C 5.5 03/26/2020    Hyperlipidemia: Min elevated LDL in May. No current meds.  Not fasting today.  The 10-year ASCVD risk score Mikey Bussing DC Brooke Bonito., et al., 2013) is: 7.3%   Values used to calculate the score:     Age: 43 years     Sex: Male     Is Non-Hispanic African American: No     Diabetic: No     Tobacco smoker: No     Systolic Blood Pressure: 726 mmHg     Is BP treated: No     HDL Cholesterol: 47 mg/dL     Total Cholesterol: 167 mg/dL  Lab Results  Component Value Date   CHOL 167 03/26/2020   HDL 47 03/26/2020   LDLCALC 103 (H) 03/26/2020   TRIG 92 03/26/2020   CHOLHDL 3.6 03/26/2020   Lab Results  Component Value Date   ALT 19 03/26/2020   AST 25 03/26/2020   ALKPHOS 62 03/26/2020   BILITOT 0.7 03/26/2020      Elevated PSA Renal 4.1 on May 14 physical, but thought to be possibly elevated due to recent intercourse. Repeat testing 2.63 on October 25th with urology.  Plan for recheck PSA and exam in 1 year.  Right heel pain. New pair of work boots from city in August. Noticed some R heel pain soon after. Worse  last week. Epsom salts and ice over the weekend. No redness/swelling/heat as usually felt with gout. Did take few indomethacin - min change.  Slight improvement past few days. Able to work. biofreeze at lunchtime. Sore in am, better as gets moving. Sore later after walking. Last week worse.  Has tried silicone heel insert. Min change.  No change in activity/exercise.  History of gout treated with probenecid twice daily.  Has indomethacin/colchicine if needed.   History Patient Active Problem List   Diagnosis Date Noted  . Right elbow pain 12/19/2018  . 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 Thrombocytopenia ([place]) 03/14/2016  . Elevated PSA 03/20/2014  . Gout 06/11/2012   Past Medical History:  Diagnosis Date  . Allergy   . 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 Gout   . Heart murmur   . Lumbar strain   . Thrombocytopenia ([place]) 03/14/2016   Past Surgical History:  Procedure Laterality Date  . broken nose  1970  . broken nose surgery, in the early 70"s    . HERNIA REPAIR  Allergies  Allergen Reactions  . Allopurinol    Prior to Admission medications   Medication Sig Start Date End Date Taking? Authorizing Provider  azelastine (OPTIVAR) 0.05 % ophthalmic solution Place 1 drop into both eyes 2 (two) times daily. 04/01/20  Yes Wendie Agreste, MD  cholecalciferol (VITAMIN D) 1000 UNITS tablet Take 1,000 Units by mouth daily.   Yes [provider]  colchicine (COLCRYS) 0.6 MG tablet TAKE 1 TABLET BY MOUTH EVERY OTHER DAY 04/01/20  Yes Wendie Agreste, MD  cyclobenzaprine (FLEXERIL) 5 MG tablet 1 pill by mouth up to every 8 hours as needed. Start with one pill by mouth each bedtime as needed due to sedation 04/01/20  Yes Wendie Agreste, MD  diclofenac (VOLTAREN) 0.1 % ophthalmic solution 4 (four) times daily. PRN   Yes [provider]  glucosamine-chondroitin 500-400 MG tablet Take 1 tablet by mouth 3 (three) times daily.   Yes [provider]  indomethacin (INDOCIN) 25 MG capsule TAKE 1 CAPSULE BY MOUTH 3 TIMES DAILY WITH MEALS. USE DURING GOUT FLARE IF NEEDED. 09/25/20  Yes Wendie Agreste, MD  ketoconazole (NIZORAL) 2 % cream Apply 1 application topically 2 (two) times daily. 02/25/20  Yes [provider]  probenecid (BENEMID) 500 MG tablet TAKE 1 TABLET BY MOUTH TWICE A DAY 05/27/20  Yes Wendie Agreste, MD  Turmeric 500 MG TABS Take by mouth.   Yes [provider]  predniSONE (DELTASONE) 20 MG tablet Take 2 tablets (40 mg total) by mouth daily with breakfast. Patient not taking: Reported on 10/01/2020 01/22/20   Wendie Agreste, MD   Social History   Socioeconomic History  . Marital status: Married    Spouse name: Not on file  . Number of children: 0  . Years of education: Not on file  . Highest education level: Not on file  Occupational History  . Occupation: Landscaper  Tobacco Use  . Smoking status: Never Smoker  . Smokeless tobacco: Never Used  Vaping Use  . Vaping Use: Never used  Substance and Sexual Activity  . Alcohol use: Yes    Alcohol/week: 0.0 standard drinks    Comment: few beers  . Drug use: No  . Sexual activity: Yes  Other Topics Concern  . Not on file  Social History Narrative   Married   Education: Secretary/administrator   Exercise: Yes   Social Determinants of Health   Financial Resource Strain:   . Difficulty of Paying Living Expenses: Not on file  Food Insecurity:   . Worried About Charity fundraiser in the Last Year: Not on file  . Ran Out of Food in the Last Year: Not on file  Transportation Needs:   . Lack of Transportation (Medical): Not on file  . Lack of Transportation (Non-Medical): Not on file  Physical Activity:   . Days of Exercise per Week: Not on file  . Minutes of Exercise per Session: Not on file  Stress:   . Feeling of Stress : Not on file  Social Connections:   . Frequency of Communication with Friends and Family: Not on file  . Frequency of Social  Gatherings with Friends and Family: Not on file  . Attends Religious Services: Not on file  . Active Member of Clubs or Organizations: Not on file  . Attends Archivist Meetings: Not on file  . Marital Status: Not on file  Intimate Partner Violence:   . Fear of Current or Ex-Partner:  Not on file  . Emotionally Abused: Not on file  . Physically Abused: Not on file  . Sexually Abused: Not on file    Review of Systems   Objective:   Vitals:   10/01/20 0908  BP: 124/80  Pulse: 60  Temp: 98.3 F (36.8 C)  TempSrc: Temporal  SpO2: 97%  Weight: 208 lb (94.3 kg)  Height: 6' 3"  (1.905 m)     Physical Exam Constitutional:      General: He is not in acute distress.    Appearance: He is well-developed.  HENT:     Head: Normocephalic and atraumatic.  Cardiovascular:     Rate and Rhythm: Normal rate.  Pulmonary:     Effort: Pulmonary effort is normal.  Musculoskeletal:     Comments: Right foot, slight tenderness to palpation of her proximal plantar fascia, minimal at distal heel.  Lateral heel squeeze was negative.  No soft tissue swelling, warmth or erythema.  Skin intact.  Achilles nontender.  Metatarsals nontender, no focal bony tenderness of foot.  Neurological:     Mental Status: He is alert and oriented to person, place, and time.     30 minutes spent during visit, greater than 50% counseling and assimilation of information, chart review, and discussion of plan.   Assessment & Plan:  Brendan Short is a 59 y.o. male . Pain of right heel Plantar fasciitis, right  -Suspected plantar fasciitis versus painful spur.  Could be related to new footwear.  No change in activity or injury, imaging discussed but deferred today.  Symptomatic care with stretches discussed for plantar fasciitis.  Okay to continue heel cushioning, ice massage on Biofreeze as needed.  Handout given.  Recheck next 4 weeks if not improving.  Sooner if worse  Elevated LDL cholesterol level - Plan:  Lipid panel  -Borderline elevated LDL, with borderline ASCVD risk score.  Plan for fasting labs in next month.  No current meds.  Hyperglycemia - Plan: Hemoglobin A1c, Comprehensive metabolic panel  -Minimal hyperglycemia with normal A1c in May.  Has had some weight gain.  Trying to watch diet, easier during the day.  Plan for fasting lab work as above with A1c in the next month   previous elevated PSA improved on urology recheck.  Continue routine follow-up with urology  No orders of the defined types were placed in this encounter.  Patient Instructions     Heel pain likely is due to a component of plantar fasciitis.  Try the stretches we discussed, see recommendation below.  Okay to continue Biofreeze, or ice massage as needed.  If heel insert feels better, that is fine to use.  Follow-up in the next 3 to 4 weeks if not improving for possible imaging but less likely needed at this time.  Sooner if worse.  Lab only visit for fasting labs in the next few weeks if possible.  Please call us to schedule at a time that works for you.   Plantar Fasciitis  Plantar fasciitis is a painful foot condition that affects the heel. It occurs when the band of tissue that connects the toes to the heel bone (plantar fascia) becomes irritated. This can happen as the result of exercising too much or doing other repetitive activities (overuse injury). The pain from plantar fasciitis can range from mild irritation to severe pain that makes it difficult to walk or move. The pain is usually worse in the morning after sleeping, or after sitting or lying down for a  while. Pain may also be worse after long periods of walking or standing. What are the causes? This condition may be caused by:  Standing for long periods of time.  Wearing shoes that do not have good arch support.  Doing activities that put stress on joints (high-impact activities), including running, aerobics, and ballet.  Being overweight.  An  abnormal way of walking (gait).  Tight muscles in the back of your lower leg (calf).  High arches in your feet.  Starting a new athletic activity. What are the signs or symptoms? The main symptom of this condition is heel pain. Pain may:  Be worse with first steps after a time of rest, especially in the morning after sleeping or after you have been sitting or lying down for a while.  Be worse after long periods of standing still.  Decrease after 30-45 minutes of activity, such as gentle walking. How is this diagnosed? This condition may be diagnosed based on your medical history and your symptoms. Your health care provider may ask questions about your activity level. Your health care provider will do a physical exam to check for:  A tender area on the bottom of your foot.  A high arch in your foot.  Pain when you move your foot.  Difficulty moving your foot. You may have imaging tests to confirm the diagnosis, such as:  X-rays.  Ultrasound.  MRI. How is this treated? Treatment for plantar fasciitis depends on how severe your condition is. Treatment may include:  Rest, ice, applying pressure (compression), and raising the affected foot (elevation). This may be called RICE therapy. Your health care provider may recommend RICE therapy along with over-the-counter pain medicines to manage your pain.  Exercises to stretch your calves and your plantar fascia.  A splint that holds your foot in a stretched, upward position while you sleep (night splint).  Physical therapy to relieve symptoms and prevent problems in the future.  Injections of steroid medicine (cortisone) to relieve pain and inflammation.  Stimulating your plantar fascia with electrical impulses (extracorporeal shock wave therapy). This is usually the last treatment option before surgery.  Surgery, if other treatments have not worked after 12 months. Follow these instructions at home:  Managing pain, stiffness,  and swelling  If directed, put ice on the painful area: ? Put ice in a plastic bag, or use a frozen bottle of water. ? Place a towel between your skin and the bag or bottle. ? Roll the bottom of your foot over the bag or bottle. ? Do this for 20 minutes, 2-3 times a day.  Wear athletic shoes that have air-sole or gel-sole cushions, or try wearing soft shoe inserts that are designed for plantar fasciitis.  Raise (elevate) your foot above the level of your heart while you are sitting or lying down. Activity  Avoid activities that cause pain. Ask your health care provider what activities are safe for you.  Do physical therapy exercises and stretches as told by your health care provider.  Try activities and forms of exercise that are easier on your joints (low-impact). Examples include swimming, water aerobics, and biking. General instructions  Take over-the-counter and prescription medicines only as told by your health care provider.  Wear a night splint while sleeping, if told by your health care provider. Loosen the splint if your toes tingle, become numb, or turn cold and blue.  Maintain a healthy weight, or work with your health care provider to lose weight as needed.  Keep all follow-up visits as told by your health care provider. This is important. Contact a health care provider if you:  Have symptoms that do not go away after caring for yourself at home.  Have pain that gets worse.  Have pain that affects your ability to move or do your daily activities. Summary  Plantar fasciitis is a painful foot condition that affects the heel. It occurs when the band of tissue that connects the toes to the heel bone (plantar fascia) becomes irritated.  The main symptom of this condition is heel pain that may be worse after exercising too much or standing still for a long time.  Treatment varies, but it usually starts with rest, ice, compression, and elevation (RICE therapy) and  over-the-counter medicines to manage pain. This information is not intended to replace advice given to you by your health care provider. Make sure you discuss any questions you have with your health care provider. Document Revised: 10/12/2017 Document Reviewed: 08/27/2017 Elsevier Patient Education  [employer].    If you have lab work done today you will be contacted with your lab results within the next 2 weeks.  If you have not heard from [place] then please contact us. The fastest way to get your results is to register for My Chart.   IF you received an x-ray today, you will receive an invoice from [facility]. Please contact [facility] at [phone] with questions or concerns regarding your invoice.   IF you received labwork today, you will receive an invoice from [place]. Please contact [facility] at [phone] with questions or concerns regarding your invoice.   Our billing staff will not be able to assist you with questions regarding bills from these companies.  You will be contacted with the lab results as soon as they are available. The fastest way to get your results is to activate your My Chart account. Instructions are located on the last page of this paperwork. If you have not heard from [place] regarding the results in 2 weeks, please contact this office.         Signed, Merri Ray, MD [facility]

## 2020-10-01 NOTE — Patient Instructions (Addendum)
Heel pain likely is due to a component of plantar fasciitis.  Try the stretches we discussed, see recommendation below.  Okay to continue Biofreeze, or ice massage as needed.  If heel insert feels better, that is fine to use.  Follow-up in the next 3 to 4 weeks if not improving for possible imaging but less likely needed at this time.  Sooner if worse.  Lab only visit for fasting labs in the next few weeks if possible.  Please call us to schedule at a time that works for you.   Plantar Fasciitis  Plantar fasciitis is a painful foot condition that affects the heel. It occurs when the band of tissue that connects the toes to the heel bone (plantar fascia) becomes irritated. This can happen as the result of exercising too much or doing other repetitive activities (overuse injury). The pain from plantar fasciitis can range from mild irritation to severe pain that makes it difficult to walk or move. The pain is usually worse in the morning after sleeping, or after sitting or lying down for a while. Pain may also be worse after long periods of walking or standing. What are the causes? This condition may be caused by:  Standing for long periods of time.  Wearing shoes that do not have good arch support.  Doing activities that put stress on joints (high-impact activities), including running, aerobics, and ballet.  Being overweight.  An abnormal way of walking (gait).  Tight muscles in the back of your lower leg (calf).  High arches in your feet.  Starting a new athletic activity. What are the signs or symptoms? The main symptom of this condition is heel pain. Pain may:  Be worse with first steps after a time of rest, especially in the morning after sleeping or after you have been sitting or lying down for a while.  Be worse after long periods of standing still.  Decrease after 30-45 minutes of activity, such as gentle walking. How is this diagnosed? This condition may be diagnosed  based on your medical history and your symptoms. Your health care provider may ask questions about your activity level. Your health care provider will do a physical exam to check for:  A tender area on the bottom of your foot.  A high arch in your foot.  Pain when you move your foot.  Difficulty moving your foot. You may have imaging tests to confirm the diagnosis, such as:  X-rays.  Ultrasound.  MRI. How is this treated? Treatment for plantar fasciitis depends on how severe your condition is. Treatment may include:  Rest, ice, applying pressure (compression), and raising the affected foot (elevation). This may be called RICE therapy. Your health care provider may recommend RICE therapy along with over-the-counter pain medicines to manage your pain.  Exercises to stretch your calves and your plantar fascia.  A splint that holds your foot in a stretched, upward position while you sleep (night splint).  Physical therapy to relieve symptoms and prevent problems in the future.  Injections of steroid medicine (cortisone) to relieve pain and inflammation.  Stimulating your plantar fascia with electrical impulses (extracorporeal shock wave therapy). This is usually the last treatment option before surgery.  Surgery, if other treatments have not worked after 12 months. Follow these instructions at home:  Managing pain, stiffness, and swelling  If directed, put ice on the painful area: ? Put ice in a plastic bag, or use a frozen bottle of water. ? Place a towel  between your skin and the bag or bottle. ? Roll the bottom of your foot over the bag or bottle. ? Do this for 20 minutes, 2-3 times a day.  Wear athletic shoes that have air-sole or gel-sole cushions, or try wearing soft shoe inserts that are designed for plantar fasciitis.  Raise (elevate) your foot above the level of your heart while you are sitting or lying down. Activity  Avoid activities that cause pain. Ask your  health care provider what activities are safe for you.  Do physical therapy exercises and stretches as told by your health care provider.  Try activities and forms of exercise that are easier on your joints (low-impact). Examples include swimming, water aerobics, and biking. General instructions  Take over-the-counter and prescription medicines only as told by your health care provider.  Wear a night splint while sleeping, if told by your health care provider. Loosen the splint if your toes tingle, become numb, or turn cold and blue.  Maintain a healthy weight, or work with your health care provider to lose weight as needed.  Keep all follow-up visits as told by your health care provider. This is important. Contact a health care provider if you:  Have symptoms that do not go away after caring for yourself at home.  Have pain that gets worse.  Have pain that affects your ability to move or do your daily activities. Summary  Plantar fasciitis is a painful foot condition that affects the heel. It occurs when the band of tissue that connects the toes to the heel bone (plantar fascia) becomes irritated.  The main symptom of this condition is heel pain that may be worse after exercising too much or standing still for a long time.  Treatment varies, but it usually starts with rest, ice, compression, and elevation (RICE therapy) and over-the-counter medicines to manage pain. This information is not intended to replace advice given to you by your health care provider. Make sure you discuss any questions you have with your health care provider. Document Revised: 10/12/2017 Document Reviewed: 08/27/2017 Elsevier Patient Education  [employer].    If you have lab work done today you will be contacted with your lab results within the next 2 weeks.  If you have not heard from [place] then please contact us. The fastest way to get your results is to register for My Chart.   IF you received an  x-ray today, you will receive an invoice from [facility]. Please contact [facility] at [phone] with questions or concerns regarding your invoice.   IF you received labwork today, you will receive an invoice from [place]. Please contact [facility] at [phone] with questions or concerns regarding your invoice.   Our billing staff will not be able to assist you with questions regarding bills from these companies.  You will be contacted with the lab results as soon as they are available. The fastest way to get your results is to activate your My Chart account. Instructions are located on the last page of this paperwork. If you have not heard from [place] regarding the results in 2 weeks, please contact this office.

## 2020-10-22 ENCOUNTER — Ambulatory Visit: Payer: 59

## 2020-10-22 ENCOUNTER — Other Ambulatory Visit: Payer: Self-pay

## 2020-10-22 DIAGNOSIS — E7800 Pure hypercholesterolemia, unspecified: Secondary | ICD-10-CM

## 2020-10-22 DIAGNOSIS — R739 Hyperglycemia, unspecified: Secondary | ICD-10-CM

## 2020-10-22 LAB — LIPID PANEL
Chol/HDL Ratio: 4.4 ratio (ref 0.0–5.0)
Cholesterol, Total: 167 mg/dL (ref 100–199)
HDL: 38 mg/dL — ABNORMAL LOW (ref >39)
LDL Chol Calc (NIH): 115 mg/dL — ABNORMAL HIGH (ref 0–99)
Triglycerides: 74 mg/dL (ref 0–149)
VLDL Cholesterol Cal: 14 mg/dL (ref 5–40)

## 2020-10-22 LAB — HEMOGLOBIN A1C
Est. average glucose Bld gHb Est-mCnc: 114 mg/dL
Hgb A1c MFr Bld: 5.6 % (ref 4.8–5.6)

## 2020-10-22 LAB — COMPREHENSIVE METABOLIC PANEL
ALT: 20 IU/L (ref 0–44)
AST: 20 IU/L (ref 0–40)
Albumin/Globulin Ratio: 2 (ref 1.2–2.2)
Albumin: 4.4 g/dL (ref 3.8–4.9)
Alkaline Phosphatase: 53 IU/L (ref 44–121)
BUN/Creatinine Ratio: 16 (ref 10–24)
BUN: 17 mg/dL (ref 8–27)
Bilirubin Total: 0.6 mg/dL (ref 0.0–1.2)
CO2: 25 mmol/L (ref 20–29)
Calcium: 9.4 mg/dL (ref 8.6–10.2)
Chloride: 105 mmol/L (ref 96–106)
Creatinine, Ser: 1.08 mg/dL (ref 0.76–1.27)
GFR calc Af Amer: 86 mL/min/{1.73_m2} (ref >59)
GFR calc non Af Amer: 74 mL/min/{1.73_m2} (ref >59)
Globulin, Total: 2.2 g/dL (ref 1.5–4.5)
Glucose: 104 mg/dL — ABNORMAL HIGH (ref 65–99)
Potassium: 5 mmol/L (ref 3.5–5.2)
Sodium: 142 mmol/L (ref 134–144)
Total Protein: 6.6 g/dL (ref 6.0–8.5)

## 2020-11-04 ENCOUNTER — Encounter: Payer: Self-pay | Admitting: Emergency Medicine

## 2020-11-04 ENCOUNTER — Other Ambulatory Visit: Payer: Self-pay

## 2020-11-04 ENCOUNTER — Telehealth (INDEPENDENT_AMBULATORY_CARE_PROVIDER_SITE_OTHER): Payer: 59 | Admitting: Emergency Medicine

## 2020-11-04 VITALS — HR 60 | Ht 75.0 in | Wt 205.0 lb

## 2020-11-04 DIAGNOSIS — U071 COVID-19: Secondary | ICD-10-CM | POA: Diagnosis not present

## 2020-11-04 DIAGNOSIS — R6889 Other general symptoms and signs: Secondary | ICD-10-CM

## 2020-11-04 NOTE — Progress Notes (Signed)
Telemedicine Encounter- SOAP NOTE Established Patient Patient: Home  Provider: Office     This telephone encounter was conducted with the patient's (or proxy's) verbal consent via audio telecommunications: yes/no: Yes Patient was instructed to have this encounter in a suitably private space; and to only have persons present to whom they give permission to participate. In addition, patient identity was confirmed by use of name plus two identifiers (DOB and address).  I discussed the limitations, risks, security and privacy concerns of performing an evaluation and management service by telephone and the availability of in person appointments. I also discussed with the patient that there may be a patient responsible charge related to this service. The patient expressed understanding and agreed to proceed.  I spent a total of TIME; 0 MIN TO 60 MIN: 20 minutes talking with the patient or their proxy.  Chief Complaint  Patient presents with  . Cough    Per patient no mucus, loss of smell. Patient used the Covid 19 home test and tested positive  . Headache    Per patient his head feels heavy, denies a fever    Subjective   Brendan Short is a 59 y.o. male established patient. Telephone visit today complaining of flulike symptoms that started yesterday. Tested positive for Covid.  Denies difficulty breathing or chest pain.  Able to eat and drink.  Denies nausea or vomiting.  Denies abdominal pain or diarrhea.  Denies high fever or chills.  Has headaches, loss of smell, cough, generalized achiness and general weakness. No other complaints or medical concerns.  Vaccinated against Covid.  HPI   Patient Active Problem List   Diagnosis Date Noted  . Right elbow pain 12/19/2018  . 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 Thrombocytopenia ([place]) 03/14/2016  . Elevated PSA 03/20/2014  . Gout 06/11/2012    Past Medical History:  Diagnosis Date  . Allergy   . Drug-induced leukopenia ([place])  03/14/2016  . Gout   . Heart murmur   . Lumbar strain   . Thrombocytopenia ([place]) 03/14/2016    Current Outpatient Medications  Medication Sig Dispense Refill  . cholecalciferol (VITAMIN D) 1000 UNITS tablet Take 1,000 Units by mouth daily.    . colchicine (COLCRYS) 0.6 MG tablet TAKE 1 TABLET BY MOUTH EVERY OTHER DAY 45 tablet 3  . cyclobenzaprine (FLEXERIL) 5 MG tablet 1 pill by mouth up to every 8 hours as needed. Start with one pill by mouth each bedtime as needed due to sedation 15 tablet 1  . diclofenac (VOLTAREN) 0.1 % ophthalmic solution 4 (four) times daily. PRN    . glucosamine-chondroitin 500-400 MG tablet Take 1 tablet by mouth 3 (three) times daily.    . indomethacin (INDOCIN) 25 MG capsule TAKE 1 CAPSULE BY MOUTH 3 TIMES DAILY WITH MEALS. USE DURING GOUT FLARE IF NEEDED. 30 capsule 0  . ketoconazole (NIZORAL) 2 % cream Apply 1 application topically 2 (two) times daily.    . probenecid (BENEMID) 500 MG tablet TAKE 1 TABLET BY MOUTH TWICE A DAY 180 tablet 2  . Turmeric 500 MG TABS Take by mouth.    Marland Kitchen azelastine (OPTIVAR) 0.05 % ophthalmic solution Place 1 drop into both eyes 2 (two) times daily. (Patient not taking: Reported on 11/04/2020) 6 mL 12   No current facility-administered medications for this visit.    Allergies  Allergen Reactions  . Allopurinol     Social History   Socioeconomic History  . Marital status: Married  Spouse name: Not on file  . Number of children: 0  . Years of education: Not on file  . Highest education level: Not on file  Occupational History  . Occupation: Landscaper  Tobacco Use  . Smoking status: Never Smoker  . Smokeless tobacco: Never Used  Vaping Use  . Vaping Use: Never used  Substance and Sexual Activity  . Alcohol use: Yes    Alcohol/week: 0.0 standard drinks    Comment: few beers  . Drug use: No  . Sexual activity: Yes  Other Topics Concern  . Not on file  Social History Narrative   Married   Education: Secretary/administrator    Exercise: Yes   Social Determinants of Health   Financial Resource Strain: Not on file  Food Insecurity: Not on file  Transportation Needs: Not on file  Physical Activity: Not on file  Stress: Not on file  Social Connections: Not on file  Intimate Partner Violence: Not on file    Review of Systems  Constitutional: Positive for malaise/fatigue. Negative for chills and fever.  HENT: Positive for congestion. Negative for sore throat.   Respiratory: Positive for cough. Negative for shortness of breath.   Cardiovascular: Negative for chest pain and palpitations.  Gastrointestinal: Negative.  Negative for abdominal pain, diarrhea, nausea and vomiting.  Genitourinary: Negative.  Negative for hematuria.  Musculoskeletal: Positive for myalgias.  Skin: Negative.   Neurological: Positive for headaches. Negative for dizziness.  All other systems reviewed and are negative.   Objective  Alert and oriented x3 in no apparent respiratory distress. Vitals as reported by the patient: Today's Vitals   11/04/20 0822  Pulse: 60  SpO2: 96%  Weight: 205 lb (93 kg)  Height: 6' 3"  (1.905 m)    There are no diagnoses linked to this encounter. Brendan Short was seen today for cough, headache and nasal congestion.  Diagnoses and all orders for this visit:  COVID-19 virus infection  Flu-like symptoms  Clinically stable.  No red flag signs or symptoms.  ED precautions given.  Covid instructions given. Isolation instructions given. Advised to contact the office if no better or worse during the next several days.   I discussed the assessment and treatment plan with the patient. The patient was provided an opportunity to ask questions and all were answered. The patient agreed with the plan and demonstrated an understanding of the instructions.   The patient was advised to call back or seek an in-person evaluation if the symptoms worsen or if the condition fails to improve as anticipated.  I provided 20  minutes of non-face-to-face time during this encounter.  Horald Pollen, MD  Primary Care at [facility]

## 2020-11-24 IMAGING — DX DG ELBOW COMPLETE 3+V*R*
4 series · 4 of 4 positions shown · non-contrast
Comparison: None.

CLINICAL DATA: Right elbow pain and swelling for more than 1 week.
No known injury.

EXAM:
RIGHT ELBOW - COMPLETE 3+ VIEW

[elbow ap]
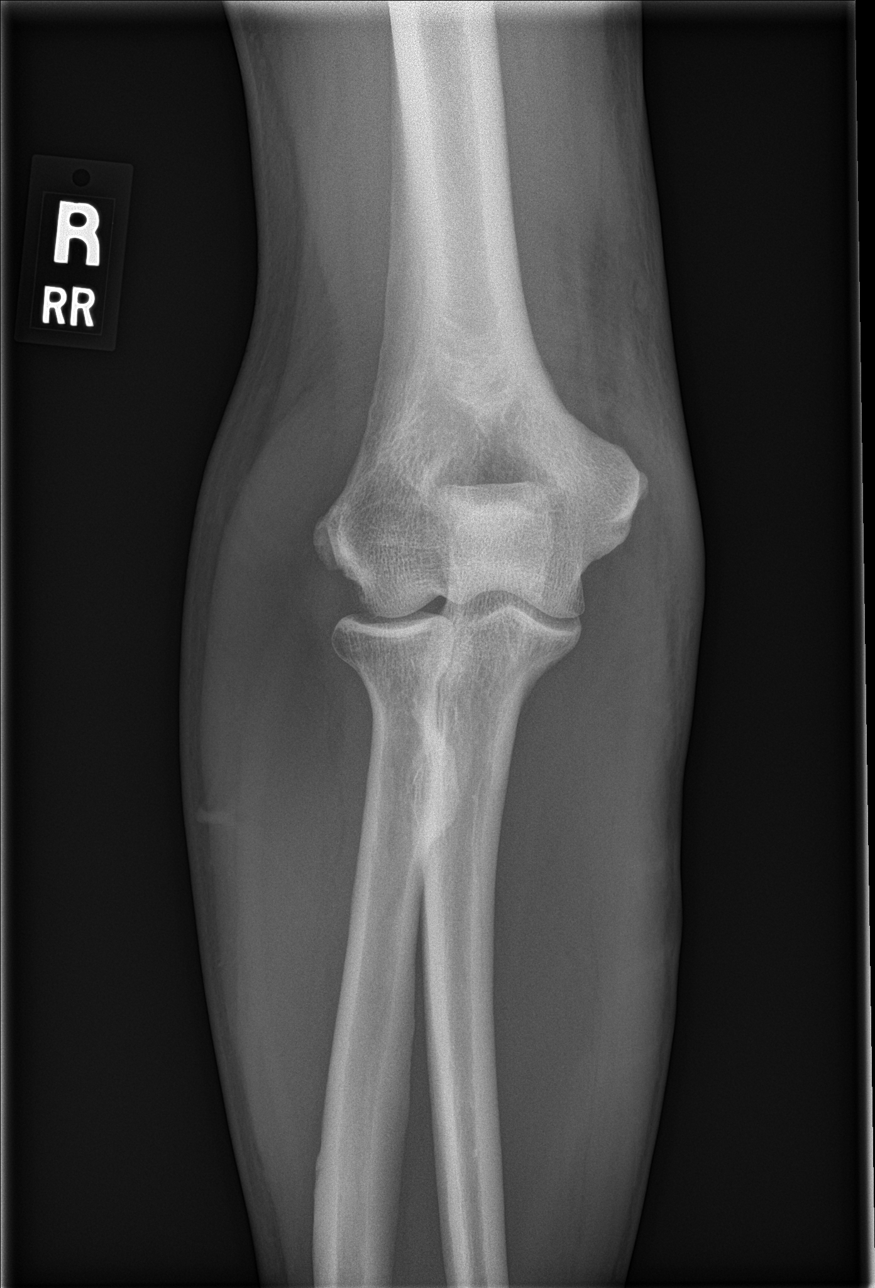

[elbow obl (1 of 2)]
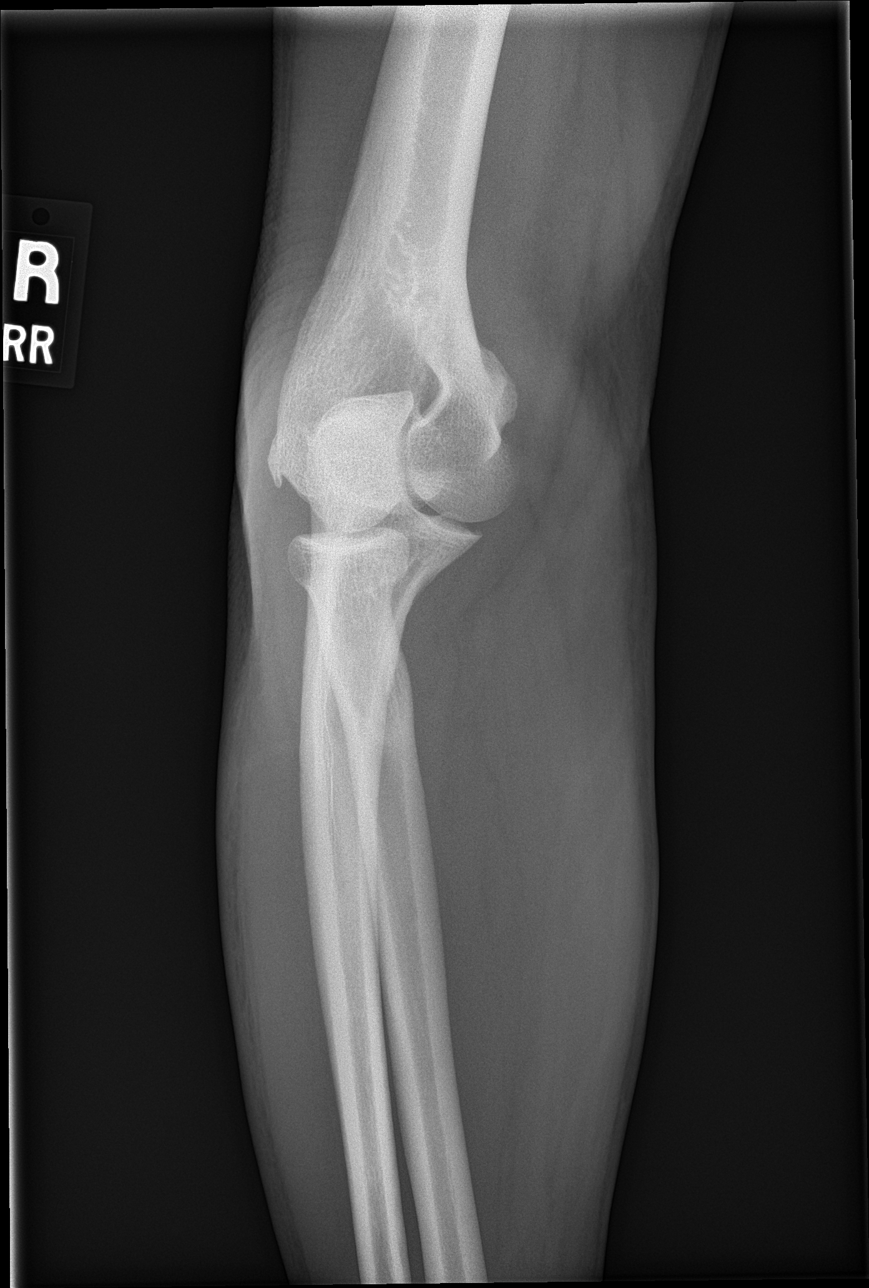

[elbow obl (2 of 2)]
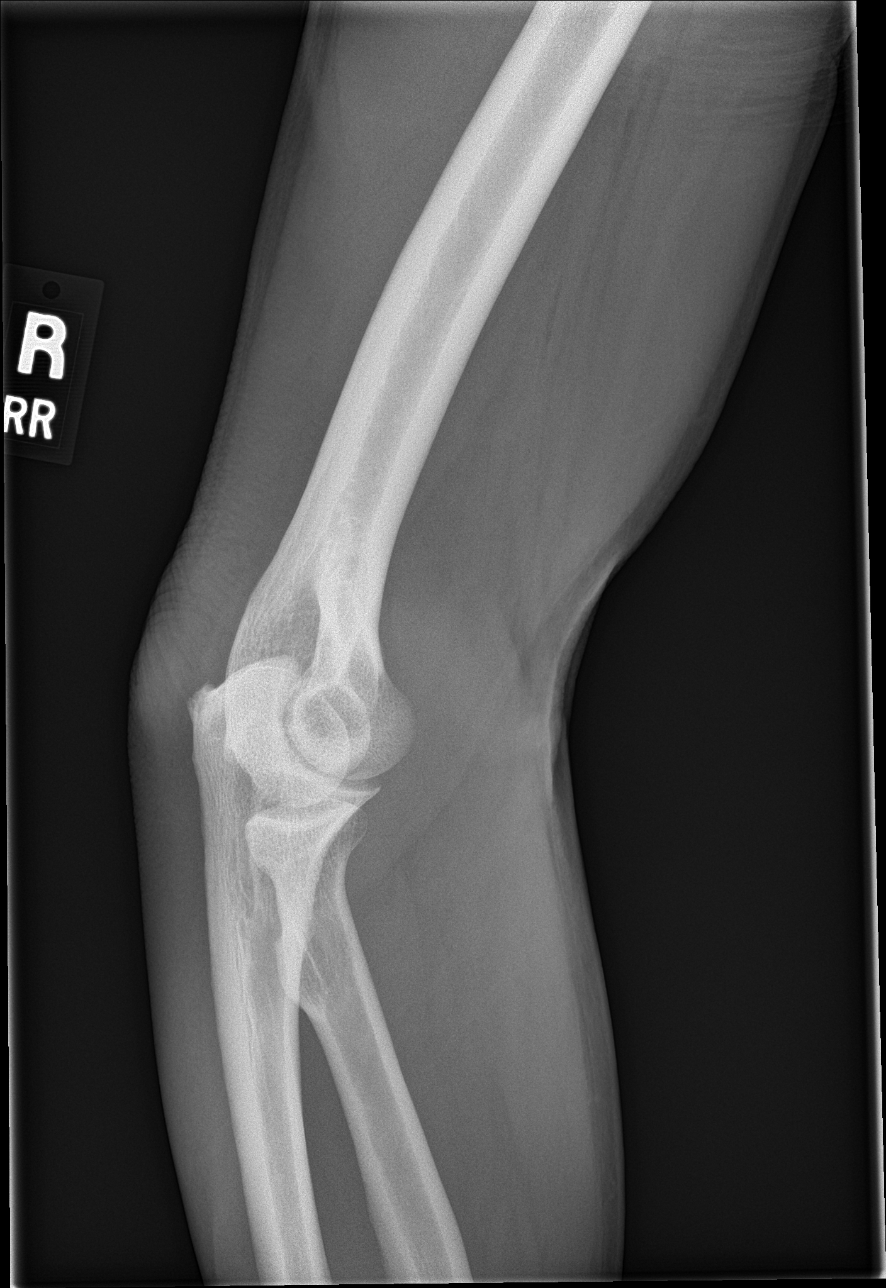

[elbow lat]
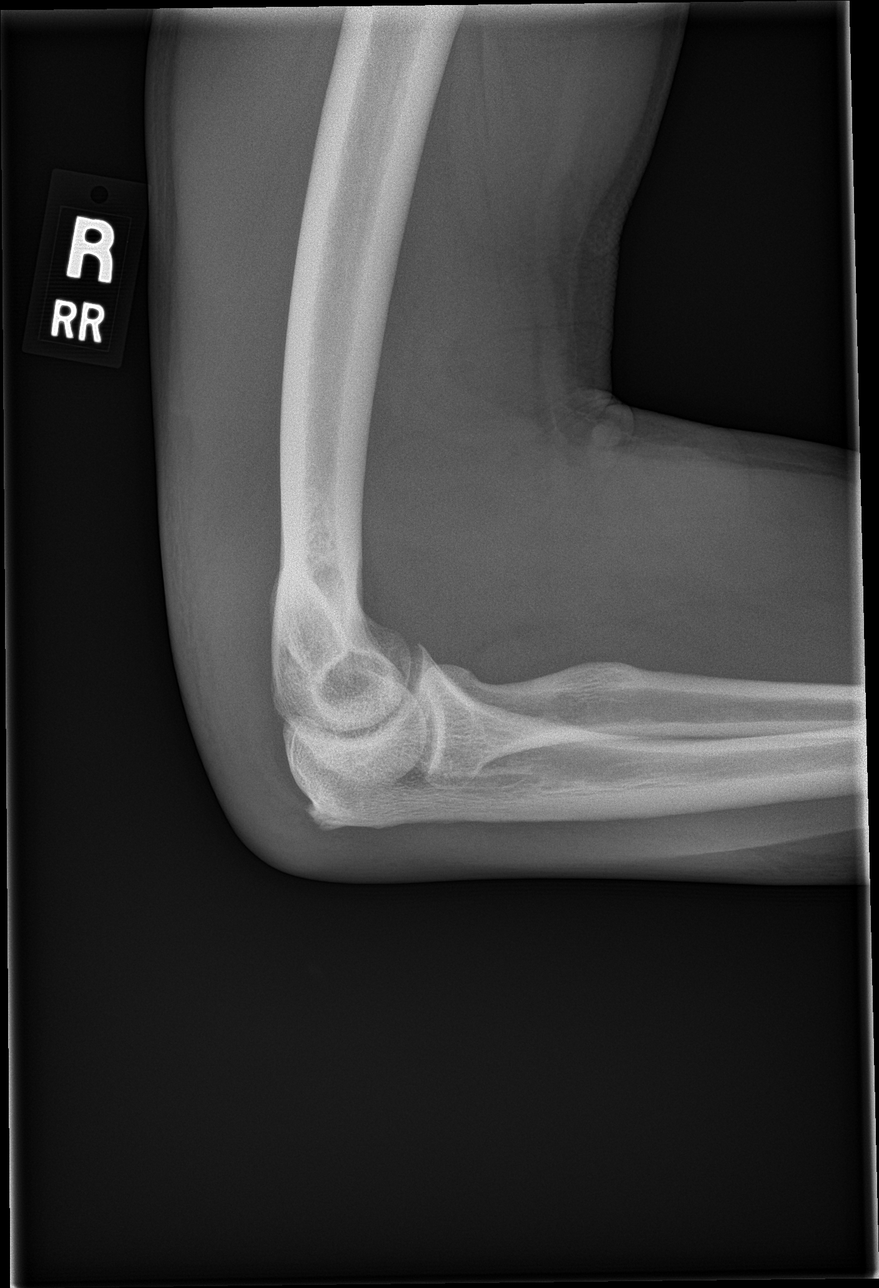

[4 of 4 positions shown; findings below may reference images not displayed]

FINDINGS: Mild olecranon spur formation and posterior soft tissue swelling.
Otherwise, there is also mild moderate lateral epicondyle spur
formation. No effusion.
IMPRESSION: Spur formation, as described above. Otherwise, unremarkable
examination.

## 2021-04-16 ENCOUNTER — Other Ambulatory Visit: Payer: Self-pay | Admitting: Family Medicine

## 2021-04-16 DIAGNOSIS — M109 Gout, unspecified: Secondary | ICD-10-CM

## 2021-04-16 DIAGNOSIS — Z8739 Personal history of other diseases of the musculoskeletal system and connective tissue: Secondary | ICD-10-CM

## 2021-04-18 NOTE — Telephone Encounter (Signed)
LFD 05/27/20 #180 with 2 refills LOV 11/04/20 with Dr. Mitchel Honour NOV 6/113/22 as a new patient at the [facility] location

## 2021-04-19 NOTE — Telephone Encounter (Signed)
Discussed at time of heel pain in 11/21 - refilled for 3 months.

## 2021-04-25 ENCOUNTER — Other Ambulatory Visit: Payer: Self-pay

## 2021-04-25 ENCOUNTER — Ambulatory Visit: Payer: 59 | Admitting: Internal Medicine

## 2021-04-25 ENCOUNTER — Encounter: Payer: Self-pay | Admitting: Internal Medicine

## 2021-04-25 VITALS — BP 130/80 | HR 74 | Temp 98.4°F | Resp 16 | Ht 75.0 in | Wt 206.0 lb

## 2021-04-25 DIAGNOSIS — M109 Gout, unspecified: Secondary | ICD-10-CM

## 2021-04-25 DIAGNOSIS — M1A09X0 Idiopathic chronic gout, multiple sites, without tophus (tophi): Secondary | ICD-10-CM | POA: Diagnosis not present

## 2021-04-25 DIAGNOSIS — D510 Vitamin B12 deficiency anemia due to intrinsic factor deficiency: Secondary | ICD-10-CM | POA: Diagnosis not present

## 2021-04-25 DIAGNOSIS — D696 Thrombocytopenia, unspecified: Secondary | ICD-10-CM | POA: Diagnosis not present

## 2021-04-25 LAB — BASIC METABOLIC PANEL
BUN: 24 mg/dL — ABNORMAL HIGH (ref 6–23)
CO2: 29 mEq/L (ref 19–32)
Calcium: 9.2 mg/dL (ref 8.4–10.5)
Chloride: 103 mEq/L (ref 96–112)
Creatinine, Ser: 1 mg/dL (ref 0.40–1.50)
GFR: 81.46 mL/min (ref >60.00)
Glucose, Bld: 97 mg/dL (ref 70–99)
Potassium: 4.4 mEq/L (ref 3.5–5.1)
Sodium: 140 mEq/L (ref 135–145)

## 2021-04-25 LAB — CBC WITH DIFFERENTIAL/PLATELET
Basophils Absolute: 0 10*3/uL (ref 0.0–0.1)
Basophils Relative: 0.6 % (ref 0.0–3.0)
Eosinophils Absolute: 0.1 10*3/uL (ref 0.0–0.7)
Eosinophils Relative: 2 % (ref 0.0–5.0)
HCT: 40.6 % (ref 39.0–52.0)
Hemoglobin: 14.1 g/dL (ref 13.0–17.0)
Lymphocytes Relative: 26.9 % (ref 12.0–46.0)
Lymphs Abs: 1.3 10*3/uL (ref 0.7–4.0)
MCHC: 34.7 g/dL (ref 30.0–36.0)
MCV: 100.2 fl — ABNORMAL HIGH (ref 78.0–100.0)
Monocytes Absolute: 0.5 10*3/uL (ref 0.1–1.0)
Monocytes Relative: 10.3 % (ref 3.0–12.0)
Neutro Abs: 3 10*3/uL (ref 1.4–7.7)
Neutrophils Relative %: 60.2 % (ref 43.0–77.0)
Platelets: 122 10*3/uL — ABNORMAL LOW (ref 150.0–400.0)
RBC: 4.05 Mil/uL — ABNORMAL LOW (ref 4.22–5.81)
RDW: 12.5 % (ref 11.5–15.5)
WBC: 5 10*3/uL (ref 4.0–10.5)

## 2021-04-25 LAB — FOLATE: Folate: 11.9 ng/mL (ref >5.9)

## 2021-04-25 LAB — VITAMIN B12: Vitamin B-12: 194 pg/mL — ABNORMAL LOW (ref 211–911)

## 2021-04-25 LAB — URIC ACID: Uric Acid, Serum: 4.5 mg/dL (ref 4.0–7.8)

## 2021-04-25 MED ORDER — COLCHICINE 0.6 MG PO TABS: ORAL_TABLET | ORAL | 1 refills | Status: DC

## 2021-04-25 MED ORDER — PROBENECID 500 MG PO TABS: 500.0000 mg | ORAL_TABLET | Freq: Two times a day (BID) | ORAL | 0 refills | Status: DC

## 2021-04-25 NOTE — Patient Instructions (Signed)
Gout  Gout is a condition that causes painful swelling of the joints. Gout is a type of inflammation of the joints (arthritis). This condition is caused by having too much uric acid in the body. Uric acid is a chemical that forms when the body breaks down substances called purines.Purines are important for building body proteins. When the body has too much uric acid, sharp crystals can form and build up inside the joints. This causes pain and swelling. Gout attacks can happen quickly and may be very painful (acute gout). Over time, the attacks can affect more joints and become more frequent (chronic gout). Gout can also cause uric acid to build up under the skin and inside thekidneys. What are the causes? This condition is caused by too much uric acid in your blood. This can happen because: Your kidneys do not remove enough uric acid from your blood. This is the most common cause. Your body makes too much uric acid. This can happen with some cancers and cancer treatments. It can also occur if your body is breaking down too many red blood cells (hemolytic anemia). You eat too many foods that are high in purines. These foods include organ meats and some seafood. Alcohol, especially beer, is also high in purines. A gout attack may be triggered by trauma or stress. What increases the risk? You are more likely to develop this condition if you: Have a family history of gout. Are male and middle-aged. Are male and have gone through menopause. Are obese. Frequently drink alcohol, especially beer. Are dehydrated. Lose weight too quickly. Have an organ transplant. Have lead poisoning. Take certain medicines, including aspirin, cyclosporine, diuretics, levodopa, and niacin. Have kidney disease. Have a skin condition called psoriasis. What are the signs or symptoms? An attack of acute gout happens quickly. It usually occurs in just one joint. The most common place is the big toe. Attacks often start  at night. Other joints that may be affected include joints of the feet, ankle, knee, fingers, wrist, or elbow. Symptoms of this condition may include: Severe pain. Warmth. Swelling. Stiffness. Tenderness. The affected joint may be very painful to touch. Shiny, red, or purple skin. Chills and fever. Chronic gout may cause symptoms more frequently. More joints may be involved. You may also have white or yellow lumps (tophi) on your hands or feet or in other areas near your joints. How is this diagnosed? This condition is diagnosed based on your symptoms, medical history, and physical exam. You may have tests, such as: Blood tests to measure uric acid levels. Removal of joint fluid with a thin needle (aspiration) to look for uric acid crystals. X-rays to look for joint damage. How is this treated? Treatment for this condition has two phases: treating an acute attack and preventing future attacks. Acute gout treatment may include medicines to reduce pain and swelling, including: NSAIDs. Steroids. These are strong anti-inflammatory medicines that can be taken by mouth (orally) or injected into a joint. Colchicine. This medicine relieves pain and swelling when it is taken soon after an attack. It can be given by mouth or through an IV. Preventive treatment may include: Daily use of smaller doses of NSAIDs or colchicine. Use of a medicine that reduces uric acid levels in your blood. Changes to your diet. You may need to see a dietitian about what to eat and drink to prevent gout. Follow these instructions at home: During a gout attack  If directed, put ice on the affected area: Put   ice in a plastic bag. Place a towel between your skin and the bag. Leave the ice on for 20 minutes, 2-3 times a day. Raise (elevate) the affected joint above the level of your heart as often as possible. Rest the joint as much as possible. If the affected joint is in your leg, you may be given crutches to  use. Follow instructions from your health care provider about eating or drinking restrictions.  Avoiding future gout attacks Follow a low-purine diet as told by your dietitian or health care provider. Avoid foods and drinks that are high in purines, including liver, kidney, anchovies, asparagus, herring, mushrooms, mussels, and beer. Maintain a healthy weight or lose weight if you are overweight. If you want to lose weight, talk with your health care provider. It is important that you do not lose weight too quickly. Start or maintain an exercise program as told by your health care provider. Eating and drinking Drink enough fluids to keep your urine pale yellow. If you drink alcohol: Limit how much you use to: 0-1 drink a day for women. 0-2 drinks a day for men. Be aware of how much alcohol is in your drink. In the [place] one drink equals one 12 oz bottle of beer (355 mL) one 5 oz glass of wine (148 mL), or one 1 oz glass of hard liquor (44 mL). General instructions Take over-the-counter and prescription medicines only as told by your health care provider. Do not drive or use heavy machinery while taking prescription pain medicine. Return to your normal activities as told by your health care provider. Ask your health care provider what activities are safe for you. Keep all follow-up visits as told by your health care provider. This is important. Contact a health care provider if you have: Another gout attack. Continuing symptoms of a gout attack after 10 days of treatment. Side effects from your medicines. Chills or a fever. Burning pain when you urinate. Pain in your lower back or belly. Get help right away if you: Have severe or uncontrolled pain. Cannot urinate. Summary Gout is painful swelling of the joints caused by inflammation. The most common site of pain is the big toe, but it can affect other joints in the body. Medicines and dietary changes can help to prevent and treat gout  attacks. This information is not intended to replace advice given to you by your health care provider. Make sure you discuss any questions you have with your healthcare provider. Document Revised: 05/22/2018 Document Reviewed: 05/22/2018 Elsevier Patient Education  2022 Elsevier Inc.  

## 2021-04-25 NOTE — Progress Notes (Signed)
Subjective:  Patient ID: Brendan Short, male    DOB: 1960/11/24  Age: 60 y.o. MRN: [id number]  CC: Follow-up  This visit occurred during the SARS-CoV-2 public health emergency.  Safety protocols were in place, including screening questions prior to the visit, additional usage of staff PPE, and extensive cleaning of exam room while observing appropriate contact time as indicated for disinfecting solutions.    HPI Brendan Short presents for f/up and to establish.  He has a hx of gout and a low PLT ct. He feels well and offers no complaints.  Outpatient Medications Prior to Visit  Medication Sig Dispense Refill   azelastine (OPTIVAR) 0.05 % ophthalmic solution Place 1 drop into both eyes 2 (two) times daily. 6 mL 12   cholecalciferol (VITAMIN D) 1000 UNITS tablet Take 1,000 Units by mouth daily.     cyclobenzaprine (FLEXERIL) 5 MG tablet 1 pill by mouth up to every 8 hours as needed. Start with one pill by mouth each bedtime as needed due to sedation 15 tablet 1   diclofenac (VOLTAREN) 0.1 % ophthalmic solution 4 (four) times daily. PRN     glucosamine-chondroitin 500-400 MG tablet Take 1 tablet by mouth 3 (three) times daily.     indomethacin (INDOCIN) 25 MG capsule TAKE 1 CAPSULE BY MOUTH 3 TIMES DAILY WITH MEALS. USE DURING GOUT FLARE IF NEEDED. 30 capsule 0   ketoconazole (NIZORAL) 2 % cream Apply 1 application topically 2 (two) times daily.     Turmeric 500 MG TABS Take by mouth.     colchicine (COLCRYS) 0.6 MG tablet TAKE 1 TABLET BY MOUTH EVERY OTHER DAY 45 tablet 3   probenecid (BENEMID) 500 MG tablet TAKE 1 TABLET BY MOUTH TWICE A DAY 180 tablet 0   No facility-administered medications prior to visit.    ROS Review of Systems  Constitutional:  Negative for diaphoresis and fatigue.  HENT: Negative.    Eyes: Negative.   Respiratory:  Negative for cough, chest tightness, shortness of breath and wheezing.   Cardiovascular:  Negative for chest pain, palpitations and leg swelling.   Gastrointestinal:  Negative for abdominal pain, constipation, diarrhea, nausea and vomiting.  Endocrine: Negative.   Genitourinary: Negative.  Negative for hematuria.  Musculoskeletal: Negative.   Skin: Negative.  Negative for color change and pallor.  Neurological: Negative.  Negative for dizziness, weakness and light-headedness.  Hematological:  Negative for adenopathy. Does not bruise/bleed easily.  Psychiatric/Behavioral: Negative.     Objective:  BP 130/80 (BP Location: Left Arm, Patient Position: Sitting, Cuff Size: Large)   Pulse 74   Temp 98.4 F (36.9 C) (Oral)   Resp 16   Ht 6' 3"  (1.905 m)   Wt 206 lb (93.4 kg)   SpO2 96%   BMI 25.75 kg/m   BP Readings from Last 3 Encounters:  04/25/21 130/80  10/01/20 124/80  04/01/20 118/77    Wt Readings from Last 3 Encounters:  04/25/21 206 lb (93.4 kg)  11/04/20 205 lb (93 kg)  10/01/20 208 lb (94.3 kg)    Physical Exam Vitals reviewed.  HENT:     Nose: Nose normal.     Mouth/Throat:     Mouth: Mucous membranes are moist.  Eyes:     Conjunctiva/sclera: Conjunctivae normal.  Cardiovascular:     Rate and Rhythm: Normal rate and regular rhythm.     Heart sounds: No murmur heard. Pulmonary:     Effort: Pulmonary effort is normal.     Breath sounds:  No stridor. No wheezing, rhonchi or rales.  Abdominal:     General: Abdomen is flat. Bowel sounds are normal. There is no distension.     Palpations: Abdomen is soft. There is no hepatomegaly, splenomegaly or mass.     Tenderness: There is no abdominal tenderness.  Musculoskeletal:        General: No swelling. Normal range of motion.     Cervical back: Neck supple.     Right lower leg: No edema.     Left lower leg: No edema.  Lymphadenopathy:     Cervical: No cervical adenopathy.  Skin:    General: Skin is warm and dry.     Coloration: Skin is not pale.     Findings: No bruising or ecchymosis.  Neurological:     Mental Status: He is alert.  Psychiatric:         Mood and Affect: Mood normal.        Behavior: Behavior normal.    Lab Results  Component Value Date   WBC 5.0 04/25/2021   HGB 14.1 04/25/2021   HCT 40.6 04/25/2021   PLT 122.0 Repeated and verified X2. (L) 04/25/2021   GLUCOSE 97 04/25/2021   CHOL 167 10/22/2020   TRIG 74 10/22/2020   HDL 38 (L) 10/22/2020   LDLCALC 115 (H) 10/22/2020   ALT 20 10/22/2020   AST 20 10/22/2020   NA 140 04/25/2021   K 4.4 04/25/2021   CL 103 04/25/2021   CREATININE 1.00 04/25/2021   BUN 24 (H) 04/25/2021   CO2 29 04/25/2021   TSH 3.80 02/03/2016   PSA 2.63 09/06/2020   HGBA1C 5.6 10/22/2020    DG Toe 3rd Right  Result Date: 04/21/2019 CLINICAL DATA:  Right third toe pain EXAM: RIGHT THIRD TOE COMPARISON:  None. FINDINGS: No acute fracture or dislocation. No aggressive osseous lesion. 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 No aggressive osseous lesion. No erosive changes. Mild mineralization along the periphery of the third PIP joint as can be seen with CPPD or gout. IMPRESSION: 1. No acute osseous injury of the right third toe. 2. Mild mineralization along the periphery of the third PIP joint as can be seen with CPPD or gout. No erosive changes. Electronically Signed   By: Kathreen Devoid   On: 04/21/2019 08:49    Assessment & Plan:   Brendan Short was seen today for follow-up.  Diagnoses and all orders for this visit:  Thrombocytopenia ([place])- This is caused by B12 deficiency.  I recommended that he start parenteral B12 replacement therapy. -     CBC with Differential/Platelet; Future -     Vitamin B12; Future -     Folate; Future -     Folate -     Vitamin B12 -     CBC with Differential/Platelet  Gout, unspecified cause, unspecified chronicity, unspecified site -     colchicine (COLCRYS) 0.6 MG tablet; TAKE 1 TABLET BY MOUTH EVERY OTHER DAY -     probenecid (BENEMID) 500 MG tablet; Take 1 tablet (500 mg total) by mouth 2 (two) times daily.  Idiopathic chronic gout of multiple sites without  tophus- He has achieved his uric acid goal.  Will continue the combination of colchicine and probenecid. -     colchicine (COLCRYS) 0.6 MG tablet; TAKE 1 TABLET BY MOUTH EVERY OTHER DAY -     probenecid (BENEMID) 500 MG tablet; Take 1 tablet (500 mg total) by mouth 2 (two) times daily. 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 Zahner's probenecid. I am also having him maintain his cholecalciferol, diclofenac, Turmeric, glucosamine-chondroitin, cyclobenzaprine, ketoconazole, azelastine, indomethacin, and colchicine.  Meds ordered this encounter  Medications   colchicine (COLCRYS) 0.6 MG tablet    Sig: TAKE 1 TABLET BY MOUTH EVERY OTHER DAY    Dispense:  45 tablet    Refill:  1   probenecid (BENEMID) 500 MG tablet    Sig: Take 1 tablet (500 mg total) by mouth 2 (two) times daily.    Dispense:  180 tablet    Refill:  0      Follow-up: Return in about 3 months (around 07/26/2021).  Scarlette Calico, MD

## 2021-04-27 ENCOUNTER — Other Ambulatory Visit: Payer: Self-pay

## 2021-04-27 ENCOUNTER — Ambulatory Visit (INDEPENDENT_AMBULATORY_CARE_PROVIDER_SITE_OTHER): Payer: 59

## 2021-04-27 DIAGNOSIS — D510 Vitamin B12 deficiency anemia due to intrinsic factor deficiency: Secondary | ICD-10-CM | POA: Diagnosis not present

## 2021-04-27 MED ORDER — CYANOCOBALAMIN 1000 MCG/ML IJ SOLN
1000.0000 ug | Freq: Once | INTRAMUSCULAR | Status: AC
  Administered 2021-04-27: 1000 ug via INTRAMUSCULAR

## 2021-04-27 NOTE — Progress Notes (Signed)
Pt here for monthly B12 injection per Dr. Ronnald Ramp  B12 1040mg given IM, and pt tolerated injection well.  Next B12 injection scheduled for 05/27/21

## 2021-05-27 ENCOUNTER — Other Ambulatory Visit: Payer: Self-pay

## 2021-05-27 ENCOUNTER — Ambulatory Visit (INDEPENDENT_AMBULATORY_CARE_PROVIDER_SITE_OTHER): Payer: 59

## 2021-05-27 DIAGNOSIS — D510 Vitamin B12 deficiency anemia due to intrinsic factor deficiency: Secondary | ICD-10-CM

## 2021-05-27 MED ORDER — CYANOCOBALAMIN 1000 MCG/ML IJ SOLN
1000.0000 ug | Freq: Once | INTRAMUSCULAR | Status: AC
  Administered 2021-05-27: 1000 ug via INTRAMUSCULAR

## 2021-05-27 NOTE — Progress Notes (Signed)
Pt here for monthly B12 injection per Dr. Ronnald Ramp  B12 1060mg given IM, and pt tolerated injection well.  Next B12 injection scheduled for 07/01/21

## 2021-07-01 ENCOUNTER — Other Ambulatory Visit: Payer: Self-pay

## 2021-07-01 ENCOUNTER — Ambulatory Visit (INDEPENDENT_AMBULATORY_CARE_PROVIDER_SITE_OTHER): Payer: 59

## 2021-07-01 DIAGNOSIS — D510 Vitamin B12 deficiency anemia due to intrinsic factor deficiency: Secondary | ICD-10-CM

## 2021-07-01 MED ORDER — CYANOCOBALAMIN 1000 MCG/ML IJ SOLN
1000.0000 ug | Freq: Once | INTRAMUSCULAR | Status: AC
  Administered 2021-07-01: 1000 ug via INTRAMUSCULAR

## 2021-07-01 NOTE — Progress Notes (Addendum)
Pt here for monthly B12 injection per Dr Ronnald Ramp.  B12 1040mg given IM right deltoid and pt tolerated injection well.  Next B12 injection scheduled for 07/28/21 during OV.  Dr PAlain Marion can you cosign since PCP is out of office?  Medical screening examination/treatment/procedure(s) were performed by non-physician practitioner and as supervising physician I was immediately available for consultation/collaboration.  I agree with above. ALew Dawes MD

## 2021-07-28 ENCOUNTER — Other Ambulatory Visit: Payer: Self-pay

## 2021-07-28 ENCOUNTER — Ambulatory Visit: Payer: 59 | Admitting: Internal Medicine

## 2021-07-28 ENCOUNTER — Encounter: Payer: Self-pay | Admitting: Internal Medicine

## 2021-07-28 VITALS — BP 124/70 | HR 60 | Temp 98.1°F | Ht 75.0 in | Wt 205.0 lb

## 2021-07-28 DIAGNOSIS — D696 Thrombocytopenia, unspecified: Secondary | ICD-10-CM

## 2021-07-28 DIAGNOSIS — M1A09X0 Idiopathic chronic gout, multiple sites, without tophus (tophi): Secondary | ICD-10-CM | POA: Diagnosis not present

## 2021-07-28 DIAGNOSIS — M109 Gout, unspecified: Secondary | ICD-10-CM

## 2021-07-28 DIAGNOSIS — D510 Vitamin B12 deficiency anemia due to intrinsic factor deficiency: Secondary | ICD-10-CM | POA: Diagnosis not present

## 2021-07-28 LAB — CBC WITH DIFFERENTIAL/PLATELET
Basophils Absolute: 0 10*3/uL (ref 0.0–0.1)
Basophils Relative: 0.7 % (ref 0.0–3.0)
Eosinophils Absolute: 0.1 10*3/uL (ref 0.0–0.7)
Eosinophils Relative: 2.2 % (ref 0.0–5.0)
HCT: 41.9 % (ref 39.0–52.0)
Hemoglobin: 14.4 g/dL (ref 13.0–17.0)
Lymphocytes Relative: 36.5 % (ref 12.0–46.0)
Lymphs Abs: 1.2 10*3/uL (ref 0.7–4.0)
MCHC: 34.3 g/dL (ref 30.0–36.0)
MCV: 100.3 fl — ABNORMAL HIGH (ref 78.0–100.0)
Monocytes Absolute: 0.4 10*3/uL (ref 0.1–1.0)
Monocytes Relative: 11 % (ref 3.0–12.0)
Neutro Abs: 1.7 10*3/uL (ref 1.4–7.7)
Neutrophils Relative %: 49.6 % (ref 43.0–77.0)
Platelets: 125 10*3/uL — ABNORMAL LOW (ref 150.0–400.0)
RBC: 4.17 Mil/uL — ABNORMAL LOW (ref 4.22–5.81)
RDW: 12.3 % (ref 11.5–15.5)
WBC: 3.4 10*3/uL — ABNORMAL LOW (ref 4.0–10.5)

## 2021-07-28 LAB — URIC ACID: Uric Acid, Serum: 5.2 mg/dL (ref 4.0–7.8)

## 2021-07-28 MED ORDER — COLCHICINE 0.6 MG PO TABS
ORAL_TABLET | ORAL | 1 refills | Status: DC
Start: 2021-07-28 — End: 2021-12-18

## 2021-07-28 NOTE — Patient Instructions (Signed)
Gout  Gout is a condition that causes painful swelling of the joints. Gout is a type of inflammation of the joints (arthritis). This condition is caused by having too much uric acid in the body. Uric acid is a chemical that forms when the body breaks down substances called purines.Purines are important for building body proteins. When the body has too much uric acid, sharp crystals can form and build up inside the joints. This causes pain and swelling. Gout attacks can happen quickly and may be very painful (acute gout). Over time, the attacks can affect more joints and become more frequent (chronic gout). Gout can also cause uric acid to build up under the skin and inside thekidneys. What are the causes? This condition is caused by too much uric acid in your blood. This can happen because: Your kidneys do not remove enough uric acid from your blood. This is the most common cause. Your body makes too much uric acid. This can happen with some cancers and cancer treatments. It can also occur if your body is breaking down too many red blood cells (hemolytic anemia). You eat too many foods that are high in purines. These foods include organ meats and some seafood. Alcohol, especially beer, is also high in purines. A gout attack may be triggered by trauma or stress. What increases the risk? You are more likely to develop this condition if you: Have a family history of gout. Are male and middle-aged. Are male and have gone through menopause. Are obese. Frequently drink alcohol, especially beer. Are dehydrated. Lose weight too quickly. Have an organ transplant. Have lead poisoning. Take certain medicines, including aspirin, cyclosporine, diuretics, levodopa, and niacin. Have kidney disease. Have a skin condition called psoriasis. What are the signs or symptoms? An attack of acute gout happens quickly. It usually occurs in just one joint. The most common place is the big toe. Attacks often start  at night. Other joints that may be affected include joints of the feet, ankle, knee, fingers, wrist, or elbow. Symptoms of this condition may include: Severe pain. Warmth. Swelling. Stiffness. Tenderness. The affected joint may be very painful to touch. Shiny, red, or purple skin. Chills and fever. Chronic gout may cause symptoms more frequently. More joints may be involved. You may also have white or yellow lumps (tophi) on your hands or feet or in other areas near your joints. How is this diagnosed? This condition is diagnosed based on your symptoms, medical history, and physical exam. You may have tests, such as: Blood tests to measure uric acid levels. Removal of joint fluid with a thin needle (aspiration) to look for uric acid crystals. X-rays to look for joint damage. How is this treated? Treatment for this condition has two phases: treating an acute attack and preventing future attacks. Acute gout treatment may include medicines to reduce pain and swelling, including: NSAIDs. Steroids. These are strong anti-inflammatory medicines that can be taken by mouth (orally) or injected into a joint. Colchicine. This medicine relieves pain and swelling when it is taken soon after an attack. It can be given by mouth or through an IV. Preventive treatment may include: Daily use of smaller doses of NSAIDs or colchicine. Use of a medicine that reduces uric acid levels in your blood. Changes to your diet. You may need to see a dietitian about what to eat and drink to prevent gout. Follow these instructions at home: During a gout attack  If directed, put ice on the affected area: Put   ice in a plastic bag. Place a towel between your skin and the bag. Leave the ice on for 20 minutes, 2-3 times a day. Raise (elevate) the affected joint above the level of your heart as often as possible. Rest the joint as much as possible. If the affected joint is in your leg, you may be given crutches to  use. Follow instructions from your health care provider about eating or drinking restrictions.  Avoiding future gout attacks Follow a low-purine diet as told by your dietitian or health care provider. Avoid foods and drinks that are high in purines, including liver, kidney, anchovies, asparagus, herring, mushrooms, mussels, and beer. Maintain a healthy weight or lose weight if you are overweight. If you want to lose weight, talk with your health care provider. It is important that you do not lose weight too quickly. Start or maintain an exercise program as told by your health care provider. Eating and drinking Drink enough fluids to keep your urine pale yellow. If you drink alcohol: Limit how much you use to: 0-1 drink a day for women. 0-2 drinks a day for men. Be aware of how much alcohol is in your drink. In the [place] one drink equals one 12 oz bottle of beer (355 mL) one 5 oz glass of wine (148 mL), or one 1 oz glass of hard liquor (44 mL). General instructions Take over-the-counter and prescription medicines only as told by your health care provider. Do not drive or use heavy machinery while taking prescription pain medicine. Return to your normal activities as told by your health care provider. Ask your health care provider what activities are safe for you. Keep all follow-up visits as told by your health care provider. This is important. Contact a health care provider if you have: Another gout attack. Continuing symptoms of a gout attack after 10 days of treatment. Side effects from your medicines. Chills or a fever. Burning pain when you urinate. Pain in your lower back or belly. Get help right away if you: Have severe or uncontrolled pain. Cannot urinate. Summary Gout is painful swelling of the joints caused by inflammation. The most common site of pain is the big toe, but it can affect other joints in the body. Medicines and dietary changes can help to prevent and treat gout  attacks. This information is not intended to replace advice given to you by your health care provider. Make sure you discuss any questions you have with your healthcare provider. Document Revised: 05/22/2018 Document Reviewed: 05/22/2018 Elsevier Patient Education  2022 Elsevier Inc.  

## 2021-07-28 NOTE — Progress Notes (Addendum)
Subjective:  Patient ID: Brendan Short, male    DOB: [birth year]  Age: 60 y.o. MRN: [id number]  CC: Follow-up  This visit occurred during the SARS-CoV-2 public health emergency.  Safety protocols were in place, including screening questions prior to the visit, additional usage of staff PPE, and extensive cleaning of exam room while observing appropriate contact time as indicated for disinfecting solutions.    HPI Brendan Short presents for f/up -  He is receiving B12 replacement therapy and says his fatigue has improved.  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  He denies bleeding or bruising.  Outpatient Medications Prior to Visit  Medication Sig Dispense Refill   azelastine (OPTIVAR) 0.05 % ophthalmic solution Place 1 drop into both eyes 2 (two) times daily. 6 mL 12   cholecalciferol (VITAMIN D) 1000 UNITS tablet Take 1,000 Units by mouth daily.     cyclobenzaprine (FLEXERIL) 5 MG tablet 1 pill by mouth up to every 8 hours as needed. Start with one pill by mouth each bedtime as needed due to sedation 15 tablet 1   diclofenac (VOLTAREN) 0.1 % ophthalmic solution 4 (four) times daily. PRN     glucosamine-chondroitin 500-400 MG tablet Take 1 tablet by mouth 3 (three) times daily.     indomethacin (INDOCIN) 25 MG capsule TAKE 1 CAPSULE BY MOUTH 3 TIMES DAILY WITH MEALS. USE DURING GOUT FLARE IF NEEDED. 30 capsule 0   ketoconazole (NIZORAL) 2 % cream Apply 1 application topically 2 (two) times daily.     probenecid (BENEMID) 500 MG tablet Take 1 tablet (500 mg total) by mouth 2 (two) times daily. 180 tablet 0   Turmeric 500 MG TABS Take by mouth.     colchicine (COLCRYS) 0.6 MG tablet TAKE 1 TABLET BY MOUTH EVERY OTHER DAY 45 tablet 1   No facility-administered medications prior to visit.    ROS Review of Systems  Constitutional:  Negative for chills, diaphoresis, fatigue and fever.  HENT: Negative.    Eyes: Negative.   Respiratory:  Negative for cough, chest tightness, shortness of  breath and wheezing.   Cardiovascular:  Negative for chest pain, palpitations and leg swelling.  Gastrointestinal:  Negative for abdominal pain, constipation, diarrhea, nausea and vomiting.  Endocrine: Negative.   Genitourinary: Negative.  Negative for difficulty urinating.  Musculoskeletal:  Negative for arthralgias, back pain, joint swelling and myalgias.  Skin: Negative.   Neurological:  Negative for dizziness, weakness, light-headedness and headaches.  Hematological:  Negative for adenopathy. Does not bruise/bleed easily.  Psychiatric/Behavioral: Negative.     Objective:  BP 124/70 (BP Location: Left Arm, Patient Position: Sitting, Cuff Size: Large)   Pulse 60   Temp 98.1 F (36.7 C) (Oral)   Ht 6' 3"  (1.905 m)   Wt 205 lb (93 kg)   SpO2 97%   BMI 25.62 kg/m   BP Readings from Last 3 Encounters:  07/28/21 124/70  04/25/21 130/80  10/01/20 124/80    Wt Readings from Last 3 Encounters:  07/28/21 205 lb (93 kg)  04/25/21 206 lb (93.4 kg)  11/04/20 205 lb (93 kg)    Physical Exam Vitals reviewed.  Constitutional:      Appearance: Normal appearance.  HENT:     Nose: Nose normal.     Mouth/Throat:     Mouth: Mucous membranes are moist.  Eyes:     Conjunctiva/sclera: Conjunctivae normal.  Cardiovascular:     Rate and Rhythm: Normal rate and regular rhythm.  Heart sounds: No murmur heard. Pulmonary:     Effort: Pulmonary effort is normal.     Breath sounds: No stridor. No wheezing, rhonchi or rales.  Abdominal:     General: Abdomen is flat. Bowel sounds are normal. There is no distension.     Palpations: Abdomen is soft. There is no hepatomegaly or splenomegaly.     Tenderness: There is no abdominal tenderness.  Musculoskeletal:        General: No swelling. Normal range of motion.     Cervical back: Neck supple.     Right lower leg: No edema.     Left lower leg: No edema.  Lymphadenopathy:     Cervical: No cervical adenopathy.  Skin:    General: Skin is  warm and dry.  Neurological:     General: No focal deficit present.     Mental Status: He is alert.  Psychiatric:        Mood and Affect: Mood normal.        Behavior: Behavior normal.    Lab Results  Component Value Date   WBC 3.4 (L) 07/28/2021   HGB 14.4 07/28/2021   HCT 41.9 07/28/2021   PLT 125.0 (L) 07/28/2021   GLUCOSE 97 04/25/2021   CHOL 167 10/22/2020   TRIG 74 10/22/2020   HDL 38 (L) 10/22/2020   LDLCALC 115 (H) 10/22/2020   ALT 20 10/22/2020   AST 20 10/22/2020   NA 140 04/25/2021   K 4.4 04/25/2021   CL 103 04/25/2021   CREATININE 1.00 04/25/2021   BUN 24 (H) 04/25/2021   CO2 29 04/25/2021   TSH 3.80 02/03/2016   PSA 2.63 09/06/2020   HGBA1C 5.6 10/22/2020    DG Toe 3rd Right  Result Date: 04/21/2019 CLINICAL DATA:  Right third toe pain EXAM: RIGHT THIRD TOE COMPARISON:  None. FINDINGS: No acute fracture or dislocation. No aggressive osseous lesion. 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 No aggressive osseous lesion. No erosive changes. Mild mineralization along the periphery of the third PIP joint as can be seen with CPPD or gout. IMPRESSION: 1. No acute osseous injury of the right third toe. 2. Mild mineralization along the periphery of the third PIP joint as can be seen with CPPD or gout. No erosive changes. Electronically Signed   By: Kathreen Devoid   On: 04/21/2019 08:49    Assessment & Plan:   Brendan Short was seen today for follow-up.  Diagnoses and all orders for this visit:  Vitamin B12 deficiency anemia due to intrinsic factor deficiency- I recommended that he continue parenteral B12 replacement therapy. -     CBC with Differential/Platelet; Future -     CBC with Differential/Platelet  Thrombocytopenia ([place])- His platelet count has been mildly low but stable over the last 3 years.  It has not improved much with B12 replacement therapy.  This is likely ITP.  Will continue to follow. -     CBC with Differential/Platelet; Future -     CBC with  Differential/Platelet  Gout, unspecified cause, unspecified chronicity, unspecified site  Idiopathic chronic gout of multiple sites without tophus- He has achieved his uric acid goal. -     colchicine (COLCRYS) 0.6 MG tablet; TAKE 1 TABLET BY MOUTH EVERY OTHER DAY -     Uric acid; Future -     Uric acid  I am having Milnor maintain his cholecalciferol, diclofenac, Turmeric, glucosamine-chondroitin, cyclobenzaprine, ketoconazole, azelastine, indomethacin, probenecid, and colchicine.  Meds ordered this encounter  Medications   colchicine (COLCRYS) 0.6 MG tablet    Sig: TAKE 1 TABLET BY MOUTH EVERY OTHER DAY    Dispense:  15 tablet    Refill:  1     Follow-up: Return in about 4 months (around 11/27/2021).  Scarlette Calico, MD

## 2021-08-01 NOTE — Progress Notes (Signed)
Subjective:    Patient ID: Brendan Short, male    DOB: September 02, 1961, 60 y.o.   MRN: [id number]  This visit occurred during the SARS-CoV-2 public health emergency.  Safety protocols were in place, including screening questions prior to the visit, additional usage of staff PPE, and extensive cleaning of exam room while observing appropriate contact time as indicated for disinfecting solutions.    HPI The patient is here for an acute visit.   Lower back pain -  he has a history of lower back pain.  He is a Development worker, international aid.  He has intermittent flares, which has been going on for years.  He can take a muscle relaxer and that sometimes helps.  He has required steroids in the past.  Sometimes it works itself out.  His current flare was related to driving a long distance last week.  He woke up 2 mornings ago and had pain in his right gluteus region.  He may have had slight pain radiation down the leg yesterday, but it was very mild.  He denies any numbness, tingling or weakness.  He feels like the right gluteus is spasming.  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     Medications and allergies reviewed with patient and updated if appropriate.  Patient Active Problem List   Diagnosis Date Noted   Idiopathic chronic gout of multiple sites without tophus 04/25/2021   Vitamin B12 deficiency anemia due to intrinsic factor deficiency 04/25/2021   Thrombocytopenia ([place]) 03/14/2016   Gout 06/11/2012    Current Outpatient Medications on File Prior to Visit  Medication Sig Dispense Refill   azelastine (OPTIVAR) 0.05 % ophthalmic solution Place 1 drop into both eyes 2 (two) times daily. 6 mL 12   cholecalciferol (VITAMIN D) 1000 UNITS tablet Take 1,000 Units by mouth daily.     colchicine (COLCRYS) 0.6 MG tablet TAKE 1 TABLET BY MOUTH EVERY OTHER DAY 15 tablet 1   cyclobenzaprine (FLEXERIL) 5 MG tablet 1 pill by mouth up to every 8 hours as needed. Start with one pill by mouth each bedtime  as needed due to sedation 15 tablet 1   diclofenac (VOLTAREN) 0.1 % ophthalmic solution 4 (four) times daily. PRN     glucosamine-chondroitin 500-400 MG tablet Take 1 tablet by mouth 3 (three) times daily.     indomethacin (INDOCIN) 25 MG capsule TAKE 1 CAPSULE BY MOUTH 3 TIMES DAILY WITH MEALS. USE DURING GOUT FLARE IF NEEDED. 30 capsule 0   ketoconazole (NIZORAL) 2 % cream Apply 1 application topically 2 (two) times daily.     probenecid (BENEMID) 500 MG tablet Take 1 tablet (500 mg total) by mouth 2 (two) times daily. 180 tablet 0   Turmeric 500 MG TABS Take by mouth.     No current facility-administered medications on file prior to visit.    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   Gout    Heart murmur    Lumbar strain    Thrombocytopenia ([place]) 03/14/2016   UTI (urinary tract infection)     Past Surgical History:  Procedure Laterality Date   broken nose  1970   broken nose surgery, in the early 70"s     [place] History   Socioeconomic History   Marital status: Married    Spouse name: Not on file   Number of children: 0   Years of education:  Not on file   Highest education level: Not on file  Occupational History   Occupation: Landscaper  Tobacco Use   Smoking status: Never   Smokeless tobacco: Never  Vaping Use   Vaping Use: Never used  Substance and Sexual Activity   Alcohol use: Yes    Alcohol/week: 4.0 standard drinks    Types: 4 Cans of beer per week    Comment: few beers   Drug use: No   Sexual activity: Yes    Partners: Female  Other Topics Concern   Not on file  Social History Narrative   Married   Education: Secretary/administrator   Exercise: Yes   Social Determinants of Health   Financial Resource Strain: Not on file  Food Insecurity: Not on file  Transportation Needs: Not on file  Physical Activity: Not on file  Stress: Not on file  Social Connections: Not on file    Family  History  Problem Relation Age of Onset   Early death Mother    Heart disease Mother    Cancer Father    Heart disease Maternal Grandmother     Review of Systems     Objective:   Vitals:   08/02/21 1005  BP: 130/76  Pulse: 61  Temp: 98.6 F (37 C)  SpO2: 98%   BP Readings from Last 3 Encounters:  08/02/21 130/76  07/28/21 124/70  04/25/21 130/80   Wt Readings from Last 3 Encounters:  08/02/21 206 lb (93.4 kg)  07/28/21 205 lb (93 kg)  04/25/21 206 lb (93.4 kg)   Body mass index is 25.75 kg/m.   Physical Exam Constitutional:      General: He is not in acute distress.    Appearance: Normal appearance. He is not ill-appearing.  HENT:     Head: Normocephalic and atraumatic.  Musculoskeletal:        General: Tenderness (Tenderness with palpation upper gluteal/buttock region, no lumbar spine or SI joint pain) present. No swelling or deformity.     Right lower leg: No edema.     Left lower leg: No edema.  Skin:    General: Skin is warm and dry.  Neurological:     Mental Status: He is alert.     Sensory: No sensory deficit.     Motor: No weakness.     Gait: Gait normal.           Assessment & Plan:    Strain of lumbar region: Acute Right buttock pain/strain/spasm Yesterday he may have had a slight radiation of pain in the right leg, but that has resolved.  No numbness/tingling or weakness in the leg No lumbar spine pain He has had intermittent flares similar to this over the years Flexeril 5 mg at bedtime as needed-prescription sent to pharmacy Depo-Medrol 80 mg IM x1 today Call if no improvement

## 2021-08-02 ENCOUNTER — Ambulatory Visit: Payer: 59 | Admitting: Internal Medicine

## 2021-08-02 ENCOUNTER — Encounter: Payer: Self-pay | Admitting: Internal Medicine

## 2021-08-02 ENCOUNTER — Other Ambulatory Visit: Payer: Self-pay

## 2021-08-02 DIAGNOSIS — S39012A Strain of muscle, fascia and tendon of lower back, initial encounter: Secondary | ICD-10-CM

## 2021-08-02 MED ORDER — CYCLOBENZAPRINE HCL 5 MG PO TABS: 5.0000 mg | ORAL_TABLET | Freq: Every evening | ORAL | 2 refills | Status: DC | PRN

## 2021-08-02 MED ORDER — METHYLPREDNISOLONE ACETATE 80 MG/ML IJ SUSP
80.0000 mg | Freq: Once | INTRAMUSCULAR | Status: AC
  Administered 2021-08-02: 80 mg via INTRAMUSCULAR

## 2021-08-02 NOTE — Patient Instructions (Addendum)
   You received a steroid injection today.   Medications changes include :   flexeril 5 mg at bedtime as needed  Your prescription(s) have been submitted to your pharmacy. Please take as directed and contact our office if you believe you are having problem(s) with the medication(s).   Please call if there is no improvement in your symptoms.

## 2021-08-15 ENCOUNTER — Other Ambulatory Visit: Payer: Self-pay | Admitting: Family Medicine

## 2021-08-15 DIAGNOSIS — M1A09X0 Idiopathic chronic gout, multiple sites, without tophus (tophi): Secondary | ICD-10-CM

## 2021-08-15 DIAGNOSIS — M109 Gout, unspecified: Secondary | ICD-10-CM

## 2021-09-09 ENCOUNTER — Other Ambulatory Visit: Payer: Self-pay | Admitting: Internal Medicine

## 2021-09-09 DIAGNOSIS — M109 Gout, unspecified: Secondary | ICD-10-CM

## 2021-09-09 DIAGNOSIS — M1A09X0 Idiopathic chronic gout, multiple sites, without tophus (tophi): Secondary | ICD-10-CM

## 2021-09-15 NOTE — Progress Notes (Signed)
Subjective:    Patient ID: Brendan Short, male    DOB: Dec 01, 1960, 60 y.o.   MRN: [id number]  This visit occurred during the SARS-CoV-2 public health emergency.  Safety protocols were in place, including screening questions prior to the visit, additional usage of staff PPE, and extensive cleaning of exam room while observing appropriate contact time as indicated for disinfecting solutions.    HPI The patient is here for an acute visit.   Lower back pain -he was here just over a month ago for lower back pain and states he did have a recurrence and last week was having some intermittent pain.  The pain is better today, but he is still experiencing some.  At 1 point last week he did have some radiating pain into the right upper leg, but that resolved after few days.  Typically the pain is in the left lower back or the right lower back.  It does go side to side.  He tries to be careful at work when he is lifting things.  He denies numbness, tingling or weakness in the legs.  On his last visit we did discuss seeing sports medicine for further evaluation and to discuss prevention of back flares and he is interested in that.   Medications and allergies reviewed with patient and updated if appropriate.  Patient Active Problem List   Diagnosis Date Noted   Idiopathic chronic gout of multiple sites without tophus 04/25/2021   Vitamin B12 deficiency anemia due to intrinsic factor deficiency 04/25/2021   Thrombocytopenia ([place]) 03/14/2016   Gout 06/11/2012    Current Outpatient Medications on File Prior to Visit  Medication Sig Dispense Refill   azelastine (OPTIVAR) 0.05 % ophthalmic solution Place 1 drop into both eyes 2 (two) times daily. 6 mL 12   cholecalciferol (VITAMIN D) 1000 UNITS tablet Take 1,000 Units by mouth daily.     colchicine (COLCRYS) 0.6 MG tablet TAKE 1 TABLET BY MOUTH EVERY OTHER DAY 15 tablet 1   diclofenac (VOLTAREN) 0.1 % ophthalmic solution 4 (four) times daily. PRN      glucosamine-chondroitin 500-400 MG tablet Take 1 tablet by mouth 3 (three) times daily.     indomethacin (INDOCIN) 25 MG capsule TAKE 1 CAPSULE BY MOUTH 3 TIMES DAILY WITH MEALS. USE DURING GOUT FLARE IF NEEDED. 30 capsule 0   ketoconazole (NIZORAL) 2 % cream Apply 1 application topically 2 (two) times daily.     probenecid (BENEMID) 500 MG tablet TAKE 1 TABLET BY MOUTH TWICE A DAY 180 tablet 0   Turmeric 500 MG TABS Take by mouth.     naproxen (NAPROSYN) 500 MG tablet Take 500 mg by mouth 2 (two) times daily.     No current facility-administered medications on file prior to visit.    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   Gout    Heart murmur    Lumbar strain    Thrombocytopenia ([place]) 03/14/2016   UTI (urinary tract infection)     Past Surgical History:  Procedure Laterality Date   broken nose  1970   broken nose surgery, in the early 70"s     [place] History   Socioeconomic History   Marital status: Married    Spouse name: Not on file   Number of children: 0   Years of education: Not on file   Highest education level: Not on file  Occupational History   Occupation: Landscaper  Tobacco Use   Smoking status: Never   Smokeless tobacco: Never  Vaping Use   Vaping Use: Never used  Substance and Sexual Activity   Alcohol use: Yes    Alcohol/week: 4.0 standard drinks    Types: 4 Cans of beer per week    Comment: few beers   Drug use: No   Sexual activity: Yes    Partners: Female  Other Topics Concern   Not on file  Social History Narrative   Married   Education: Secretary/administrator   Exercise: Yes   Social Determinants of Health   Financial Resource Strain: Not on file  Food Insecurity: Not on file  Transportation Needs: Not on file  Physical Activity: Not on file  Stress: Not on file  Social Connections: Not on file    Family History  Problem Relation Age of Onset   Early death Mother     Heart disease Mother    Cancer Father    Heart disease Maternal Grandmother     Review of Systems     Objective:   Vitals:   09/16/21 1117  BP: 120/84  Pulse: 69  Temp: 98.3 F (36.8 C)  SpO2: 97%   BP Readings from Last 3 Encounters:  09/16/21 120/84  08/02/21 130/76  07/28/21 124/70   Wt Readings from Last 3 Encounters:  09/16/21 208 lb (94.3 kg)  08/02/21 206 lb (93.4 kg)  07/28/21 205 lb (93 kg)   Body mass index is 26 kg/m.   Physical Exam Constitutional:      General: He is not in acute distress.    Appearance: Normal appearance. He is not ill-appearing.  HENT:     Head: Normocephalic and atraumatic.  Musculoskeletal:        General: No swelling, tenderness or deformity.     Right lower leg: No edema.     Left lower leg: No edema.  Skin:    General: Skin is warm and dry.  Neurological:     Mental Status: He is alert.     Sensory: No sensory deficit.     Motor: No weakness.     Gait: Gait normal.           Assessment & Plan:    Lower back pain without sciatica: Chronic, intermittent Has been experiencing intermittent lower back pain for years-2 flares in the past month Continue Flexeril 5 mg at bedtime as needed-refilled today Prednisone 50 mg daily x5 days Continue to do his back exercises on a daily basis Will refer to sports medicine for further evaluation

## 2021-09-16 ENCOUNTER — Ambulatory Visit: Payer: 59 | Admitting: Internal Medicine

## 2021-09-16 ENCOUNTER — Encounter: Payer: Self-pay | Admitting: Internal Medicine

## 2021-09-16 ENCOUNTER — Other Ambulatory Visit: Payer: Self-pay

## 2021-09-16 VITALS — BP 120/84 | HR 69 | Temp 98.3°F | Ht 75.0 in | Wt 208.0 lb

## 2021-09-16 DIAGNOSIS — G8929 Other chronic pain: Secondary | ICD-10-CM | POA: Diagnosis not present

## 2021-09-16 DIAGNOSIS — M545 Low back pain, unspecified: Secondary | ICD-10-CM | POA: Diagnosis not present

## 2021-09-16 MED ORDER — PREDNISONE 50 MG PO TABS: 50.0000 mg | ORAL_TABLET | Freq: Every day | ORAL | 0 refills | Status: DC

## 2021-09-16 MED ORDER — CYCLOBENZAPRINE HCL 5 MG PO TABS: 5.0000 mg | ORAL_TABLET | Freq: Every evening | ORAL | 2 refills | Status: DC | PRN

## 2021-09-16 NOTE — Patient Instructions (Addendum)
     Medications changes include :   prednisone 50 mg daily x 5 days.  continue flexeril as needed  Your prescription(s) have been submitted to your pharmacy. Please take as directed and contact our office if you believe you are having problem(s) with the medication(s).   A referral was ordered for sports medicine       Someone from their office will call you to schedule an appointment.

## 2021-09-20 ENCOUNTER — Other Ambulatory Visit: Payer: Self-pay

## 2021-09-20 ENCOUNTER — Ambulatory Visit: Payer: 59 | Admitting: Sports Medicine

## 2021-09-20 VITALS — BP 122/80 | HR 81 | Ht 75.0 in | Wt 207.0 lb

## 2021-09-20 DIAGNOSIS — M533 Sacrococcygeal disorders, not elsewhere classified: Secondary | ICD-10-CM

## 2021-09-20 DIAGNOSIS — M9904 Segmental and somatic dysfunction of sacral region: Secondary | ICD-10-CM

## 2021-09-20 DIAGNOSIS — G8929 Other chronic pain: Secondary | ICD-10-CM

## 2021-09-20 DIAGNOSIS — M9905 Segmental and somatic dysfunction of pelvic region: Secondary | ICD-10-CM | POA: Diagnosis not present

## 2021-09-20 DIAGNOSIS — M545 Low back pain, unspecified: Secondary | ICD-10-CM

## 2021-09-20 DIAGNOSIS — M9903 Segmental and somatic dysfunction of lumbar region: Secondary | ICD-10-CM

## 2021-09-20 NOTE — Patient Instructions (Addendum)
Good to see you  Tylenol arthritis 541m 1-2 tablets daily  Add exercises given today to your normal exercises routine  Finish the prednisone prescribed by dr burns Follow up in 4 weeks

## 2021-09-20 NOTE — Progress Notes (Signed)
Benito Mccreedy D.[place] Phone: [phone]   Assessment and Plan:     1. Chronic SI joint pain 2. Chronic bilateral low back pain without sciatica 3. Somatic dysfunction of lumbar region 4. Somatic dysfunction of pelvic region 5. Somatic dysfunction of sacral region -Chronic with exacerbation, initial sports medicine visit - Unclear etiology of acute worsening of chronic pain - Patient's primary pain generator may be his SI joints bilaterally based on pain medication, improvement with p.o. 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  History of multiple lumbar disc protrusions as seen on lumbar MRI in 2019 may have led to current low back pain, however patient does not experience any radicular symptoms at this time and has no new MOI, so no imaging obtained - Complete course of prednisone - Start Tylenol 500 mg 1-2 times daily for chronic pain relief - Start HEP geared at [facility] joints   - Patient has received significant relief with OMT in the past.  Elects for repeat OMT today.  Tolerated well per note below. - Decision today to treat with OMT was based on Physical Exam  After verbal consent patient was treated with HVLA (high velocity low amplitude), ME (muscle energy), FPR (flex positional release), ST (soft tissue), PC/PD (Pelvic Compression/ Pelvic Decompression) techniques in cervical, rib, thoracic, lumbar, and pelvic areas. Patient tolerated the procedure well with improvement in symptoms.  Patient educated on potential side effects of soreness and recommended to rest, hydrate, and use Tylenol as needed for pain control.   Pertinent previous records reviewed include lumbar MRI 2019, PCP note, lumbar x-ray   Follow Up: 3 to 4 weeks for reevaluation.  Could consider repeat x-rays of lumbar spine, x-ray SI joints, +/- CSI SI joints with ultrasound    Subjective:   I, Judy Pimple, am serving as a scribe for Dr. Glennon Mac  Chief Complaint: Low back pain   HPI:   09/20/21 Patient is a 60 year old male presenting with low back pain that has been going on for the past 2 years with 2 flares in the past month. Patient saw his PCP 09/16/21 and 08/02/21 for this reason and has since been referred to sports medicine for evaluation and treatment. Patient locates pain to both the left and right side of the lower back and has been trying to be careful when lifting things at work. Patient is a Development worker, international aid.   Radiates: yes down the right upper leg Numbness/tingling:no Weakness:no Aggravates: bending and reaching. 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   Current Outpatient Medications:    azelastine (OPTIVAR) 0.05 % ophthalmic solution, Place 1 drop into both eyes 2 (two) times daily., Disp: 6 mL, Rfl: 12   cholecalciferol (VITAMIN D) 1000 UNITS tablet, Take 1,000 Units by mouth daily., Disp: , Rfl:    colchicine (COLCRYS) 0.6 MG tablet, TAKE 1 TABLET BY MOUTH EVERY OTHER DAY, Disp: 15 tablet, Rfl: 1   cyclobenzaprine (FLEXERIL) 5 MG tablet, Take 1 tablet (5 mg total) by mouth at bedtime as needed for muscle spasms., Disp: 40 tablet, Rfl: 2   diclofenac (VOLTAREN) 0.1 % ophthalmic solution, 4 (four) times daily. PRN, Disp: , Rfl:    glucosamine-chondroitin 500-400 MG tablet, Take 1  tablet by mouth 3 (three) times daily., Disp: , Rfl:    indomethacin (INDOCIN) 25 MG capsule, TAKE 1 CAPSULE BY MOUTH 3 TIMES DAILY WITH MEALS. USE DURING GOUT FLARE IF NEEDED., Disp: 30 capsule, Rfl: 0   ketoconazole (NIZORAL) 2 % cream, Apply 1 application topically 2 (two) times daily., Disp: , Rfl:    naproxen (NAPROSYN) 500 MG tablet, Take 500 mg by mouth 2  (two) times daily., Disp: , Rfl:    predniSONE (DELTASONE) 50 MG tablet, Take 1 tablet (50 mg total) by mouth daily with breakfast., Disp: 5 tablet, Rfl: 0   probenecid (BENEMID) 500 MG tablet, TAKE 1 TABLET BY MOUTH TWICE A DAY, Disp: 180 tablet, Rfl: 0   Turmeric 500 MG TABS, Take by mouth., Disp: , Rfl:    Objective:     Vitals:   09/20/21 1516  BP: 122/80  Pulse: 81  SpO2: 97%  Weight: 207 lb (93.9 kg)  Height: 6' 3"  (1.905 m)      Body mass index is 25.87 kg/m.    Physical Exam:    Gen: Appears well, nad, nontoxic and pleasant Psych: Alert and oriented, appropriate mood and affect Neuro: sensation intact, strength is 5/5 in upper and lower extremities, muscle tone wnl Skin: no susupicious lesions or rashes  Back - Normal skin, Spine with normal alignment and no deformity.   No tenderness to vertebral process palpation.   Paraspinous muscles are not tender and without spasm Straight leg raise negative, but caused pulling into back and hamstrings  Bilateral hip: No deformity, swelling or wasting ROM Fexion 90, ext 30, IR 45, ER 45 Mild TTP over gluteal musculature, SI joint NTTP over the hip flexors, greater troch, lumbar spine Negative log roll with FROM Negative FABER Negative FADIR Positive piriformis test  Gait normal   OMT Physical Exam:  ASIS Compression Test: Positive Right Sacrum: Positive sphinx test.  TTP with loading of SI joint on right Lumbar: TTP paraspinal, T1-3 RRSL Pelvis: Right anterior innominate without flare  Electronically signed by:  Benito Mccreedy D.[facility] 4:30 PM 09/20/21

## 2021-10-17 NOTE — Progress Notes (Signed)
Brendan Short Brendan Short [place] Phone: [phone]   Assessment and Plan:     1. Chronic SI joint pain 2. Chronic bilateral low back pain without sciatica 3. Somatic dysfunction of lumbar region 4. Somatic dysfunction of pelvic region 5. Somatic dysfunction of sacral region -Chronic with exacerbation, subsequent visit - Significantly improved low back and SI joint pain with HEP, OMT, daily Tylenol - Continue HEP - Continue Tylenol 1 to 3 tablets daily as needed for pain control - Patient has received significant relief with OMT in the past.  Elects for repeat OMT today.  Tolerated well per note below. - Decision today to treat with OMT was based on Physical Exam   After verbal consent patient was treated with HVLA (high velocity low amplitude), ME (muscle energy), FPR (flex positional release), ST (soft tissue), PC/PD (Pelvic Compression/ Pelvic Decompression) techniques in sacral, lumbar, and pelvic areas. Patient tolerated the procedure well with improvement in symptoms.  Patient educated on potential side effects of soreness and recommended to rest, hydrate, and use Tylenol as needed for pain control.   Pertinent previous records reviewed include none   Follow Up: 2 months for reevaluation of chronic low back pain and potentially to repeat OMT   Subjective:    Chief Complaint: Low back pain   HPI:   09/20/21 Patient is a 60 year old male presenting with low back pain that has been going on for the past 2 years with 2 flares in the past month. Patient saw his PCP 09/16/21 and 08/02/21 for this reason and has since been referred to sports medicine for evaluation and treatment. Patient locates pain to both the left and right side of the lower back and has been trying to be careful when lifting things at work. Patient is a Development worker, international aid.    Radiates: yes down the right upper leg Numbness/tingling:no Weakness:no Aggravates:  bending and reaching. 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  Current Outpatient Medications  Medication Sig Dispense Refill   azelastine (OPTIVAR) 0.05 % ophthalmic solution Place 1 drop into both eyes 2 (two) times daily. 6 mL 12   cholecalciferol (VITAMIN D) 1000 UNITS tablet Take 1,000 Units by mouth daily.     colchicine (COLCRYS) 0.6 MG tablet TAKE 1 TABLET BY MOUTH EVERY OTHER DAY 15 tablet 1   cyclobenzaprine (FLEXERIL) 5 MG tablet Take 1 tablet (5 mg total) by mouth at bedtime as needed for muscle spasms. 40 tablet 2   diclofenac (VOLTAREN) 0.1 % ophthalmic solution 4 (four) times daily. PRN     glucosamine-chondroitin 500-400 MG tablet Take 1 tablet by mouth 3 (three) times daily.     indomethacin (INDOCIN) 25 MG capsule TAKE 1 CAPSULE BY MOUTH 3 TIMES DAILY WITH MEALS. USE DURING GOUT FLARE IF NEEDED. 30 capsule 0   ketoconazole (NIZORAL) 2 % cream Apply 1 application topically 2 (two) times daily.     naproxen (NAPROSYN) 500 MG tablet Take 500 mg by mouth 2 (two) times daily.     predniSONE (DELTASONE) 50 MG tablet Take 1 tablet (50 mg total) by mouth daily with breakfast. 5 tablet 0  probenecid (BENEMID) 500 MG tablet TAKE 1 TABLET BY MOUTH TWICE A DAY 180 tablet 0   Turmeric 500 MG TABS Take by mouth.     No current facility-administered medications for this visit.      Objective:     Vitals:   10/18/21 1537  BP: 130/84  Pulse: 77  SpO2: 92%  Weight: 207 lb (93.9 kg)  Height: 6' 3"  (1.905 m)      Body mass index is 25.87 kg/m.    Physical Exam:     General: Well-appearing, cooperative, sitting comfortably in no  acute distress.   OMT Physical Exam:  ASIS Compression Test: Positive Right Sacrum: Negative sphinx, right on right.  NTTP SI joints Lumbar: TTP paraspinal, L5 rotated right Pelvis: Right anterior innominate  Electronically signed by:  Brendan Short D.[facility] 4:01 PM 10/18/21

## 2021-10-18 ENCOUNTER — Ambulatory Visit: Payer: 59 | Admitting: Sports Medicine

## 2021-10-18 ENCOUNTER — Other Ambulatory Visit: Payer: Self-pay

## 2021-10-18 VITALS — BP 130/84 | HR 77 | Ht 75.0 in | Wt 207.0 lb

## 2021-10-18 DIAGNOSIS — G8929 Other chronic pain: Secondary | ICD-10-CM | POA: Diagnosis not present

## 2021-10-18 DIAGNOSIS — M9905 Segmental and somatic dysfunction of pelvic region: Secondary | ICD-10-CM

## 2021-10-18 DIAGNOSIS — M545 Low back pain, unspecified: Secondary | ICD-10-CM

## 2021-10-18 DIAGNOSIS — M533 Sacrococcygeal disorders, not elsewhere classified: Secondary | ICD-10-CM

## 2021-10-18 DIAGNOSIS — M9903 Segmental and somatic dysfunction of lumbar region: Secondary | ICD-10-CM

## 2021-10-18 DIAGNOSIS — M9904 Segmental and somatic dysfunction of sacral region: Secondary | ICD-10-CM

## 2021-10-18 NOTE — Patient Instructions (Addendum)
Good to see you  Follow up in 2 months

## 2021-11-03 ENCOUNTER — Other Ambulatory Visit: Payer: Self-pay | Admitting: Family Medicine

## 2021-11-03 DIAGNOSIS — Z8739 Personal history of other diseases of the musculoskeletal system and connective tissue: Secondary | ICD-10-CM

## 2021-11-07 ENCOUNTER — Other Ambulatory Visit: Payer: Self-pay | Admitting: Internal Medicine

## 2021-11-07 DIAGNOSIS — Z8739 Personal history of other diseases of the musculoskeletal system and connective tissue: Secondary | ICD-10-CM

## 2021-11-28 ENCOUNTER — Other Ambulatory Visit: Payer: Self-pay | Admitting: Internal Medicine

## 2021-11-28 DIAGNOSIS — M1A09X0 Idiopathic chronic gout, multiple sites, without tophus (tophi): Secondary | ICD-10-CM

## 2021-11-28 DIAGNOSIS — M109 Gout, unspecified: Secondary | ICD-10-CM

## 2021-12-18 ENCOUNTER — Other Ambulatory Visit: Payer: Self-pay | Admitting: Internal Medicine

## 2021-12-18 DIAGNOSIS — M1A09X0 Idiopathic chronic gout, multiple sites, without tophus (tophi): Secondary | ICD-10-CM

## 2021-12-21 NOTE — Progress Notes (Unsigned)
Brendan Short D.[place] Phone: [phone]   Assessment and Plan:     There are no diagnoses linked to this encounter.  *** - Patient has received significant relief with OMT in the past.  Elects for repeat OMT today.  Tolerated well per note below. - Decision today to treat with OMT was based on Physical Exam   After verbal consent patient was treated with HVLA (high velocity low amplitude), ME (muscle energy), FPR (flex positional release), ST (soft tissue), PC/PD (Pelvic Compression/ Pelvic Decompression) techniques in cervical, rib, thoracic, lumbar, and pelvic areas. Patient tolerated the procedure well with improvement in symptoms.  Patient educated on potential side effects of soreness and recommended to rest, hydrate, and use Tylenol as needed for pain control.   Pertinent previous records reviewed include ***   Follow Up: ***     Subjective:   I, Brendan Short, am serving as a Education administrator for [employer]  Complaint: low back pain   HPI:  09/20/21 Patient is a 61 year old male presenting with low back pain that has been going on for the past 2 years with 2 flares in the past month. Patient saw his PCP 09/16/21 and 08/02/21 for this reason and has since been referred to sports medicine for evaluation and treatment. Patient locates pain to both the left and right side of the lower back and has been trying to be careful when lifting things at work. Patient is a Development worker, international aid.    Radiates: yes down the right upper leg Numbness/tingling:no Weakness:no Aggravates: bending and reaching. 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  Current Outpatient Medications  Medication Sig Dispense Refill   azelastine (OPTIVAR) 0.05 % ophthalmic solution Place 1 drop into both eyes 2 (two) times daily. 6 mL 12   cholecalciferol (VITAMIN D) 1000 UNITS tablet Take 1,000 Units by mouth daily.     colchicine 0.6 MG tablet TAKE 1 TABLET BY MOUTH EVERY OTHER DAY 15 tablet 1   cyclobenzaprine (FLEXERIL) 5 MG tablet Take 1 tablet (5 mg total) by mouth at bedtime as needed for muscle spasms. 40 tablet 2   diclofenac (VOLTAREN) 0.1 % ophthalmic solution 4 (four) times daily. PRN     glucosamine-chondroitin 500-400 MG tablet Take 1 tablet by mouth 3 (three) times daily.     indomethacin (INDOCIN) 25 MG capsule TAKE 1 CAPSULE BY MOUTH 3 TIMES DAILY WITH MEALS. USE DURING GOUT FLARE IF NEEDED. 30 capsule 0   ketoconazole (NIZORAL) 2 % cream Apply 1 application topically 2 (two) times daily.     naproxen (NAPROSYN) 500 MG tablet Take 500 mg by mouth 2 (two) times daily.     predniSONE (DELTASONE) 50 MG tablet Take 1 tablet (50 mg total) by mouth daily with breakfast. 5 tablet 0   probenecid (BENEMID) 500 MG tablet TAKE 1 TABLET BY MOUTH TWICE A DAY 60 tablet 2   Turmeric 500 MG TABS Take by mouth.     No current facility-administered medications for this visit.      Objective:  There were no vitals filed for this visit.    There is no height or weight on file to calculate BMI.    Physical Exam:     General: Well-appearing, cooperative, sitting comfortably in no acute distress.   OMT Physical Exam:  ASIS Compression Test: Positive Right Cervical: TTP paraspinal, *** Rib: Bilateral elevated first rib with TTP Thoracic: TTP paraspinal,*** Lumbar: TTP paraspinal,*** Pelvis: Right anterior innominate  Electronically signed by:  Brendan Short D.[facility] 8:03 AM 12/21/21

## 2021-12-28 ENCOUNTER — Ambulatory Visit: Payer: 59 | Admitting: Sports Medicine

## 2022-01-04 ENCOUNTER — Ambulatory Visit: Payer: 59 | Admitting: Internal Medicine

## 2022-01-04 ENCOUNTER — Other Ambulatory Visit: Payer: Self-pay

## 2022-01-04 ENCOUNTER — Encounter: Payer: Self-pay | Admitting: Internal Medicine

## 2022-01-04 VITALS — BP 158/96 | HR 80 | Temp 98.2°F | Resp 16 | Ht 75.0 in | Wt 209.0 lb

## 2022-01-04 DIAGNOSIS — D510 Vitamin B12 deficiency anemia due to intrinsic factor deficiency: Secondary | ICD-10-CM

## 2022-01-04 DIAGNOSIS — J301 Allergic rhinitis due to pollen: Secondary | ICD-10-CM

## 2022-01-04 DIAGNOSIS — M1A09X0 Idiopathic chronic gout, multiple sites, without tophus (tophi): Secondary | ICD-10-CM

## 2022-01-04 DIAGNOSIS — Z0001 Encounter for general adult medical examination with abnormal findings: Secondary | ICD-10-CM

## 2022-01-04 DIAGNOSIS — Z23 Encounter for immunization: Secondary | ICD-10-CM | POA: Diagnosis not present

## 2022-01-04 DIAGNOSIS — R0989 Other specified symptoms and signs involving the circulatory and respiratory systems: Secondary | ICD-10-CM | POA: Insufficient documentation

## 2022-01-04 DIAGNOSIS — I10 Essential (primary) hypertension: Secondary | ICD-10-CM | POA: Insufficient documentation

## 2022-01-04 DIAGNOSIS — R972 Elevated prostate specific antigen [PSA]: Secondary | ICD-10-CM

## 2022-01-04 DIAGNOSIS — D696 Thrombocytopenia, unspecified: Secondary | ICD-10-CM | POA: Diagnosis not present

## 2022-01-04 LAB — LIPID PANEL
Cholesterol: 192 mg/dL (ref 0–200)
HDL: 42 mg/dL (ref >39.00)
LDL Cholesterol: 114 mg/dL — ABNORMAL HIGH (ref 0–99)
NonHDL: 150.25
Total CHOL/HDL Ratio: 5
Triglycerides: 181 mg/dL — ABNORMAL HIGH (ref 0.0–149.0)
VLDL: 36.2 mg/dL (ref 0.0–40.0)

## 2022-01-04 LAB — CBC WITH DIFFERENTIAL/PLATELET
Basophils Absolute: 0 10*3/uL (ref 0.0–0.1)
Basophils Relative: 0.5 % (ref 0.0–3.0)
Eosinophils Absolute: 0 10*3/uL (ref 0.0–0.7)
Eosinophils Relative: 1.1 % (ref 0.0–5.0)
HCT: 41.4 % (ref 39.0–52.0)
Hemoglobin: 14.2 g/dL (ref 13.0–17.0)
Lymphocytes Relative: 26.1 % (ref 12.0–46.0)
Lymphs Abs: 1 10*3/uL (ref 0.7–4.0)
MCHC: 34.3 g/dL (ref 30.0–36.0)
MCV: 97.9 fl (ref 78.0–100.0)
Monocytes Absolute: 0.4 10*3/uL (ref 0.1–1.0)
Monocytes Relative: 10.7 % (ref 3.0–12.0)
Neutro Abs: 2.2 10*3/uL (ref 1.4–7.7)
Neutrophils Relative %: 61.6 % (ref 43.0–77.0)
Platelets: 116 10*3/uL — ABNORMAL LOW (ref 150.0–400.0)
RBC: 4.23 Mil/uL (ref 4.22–5.81)
RDW: 12.3 % (ref 11.5–15.5)
WBC: 3.6 10*3/uL — ABNORMAL LOW (ref 4.0–10.5)

## 2022-01-04 LAB — BASIC METABOLIC PANEL
BUN: 19 mg/dL (ref 6–23)
CO2: 32 mEq/L (ref 19–32)
Calcium: 9.5 mg/dL (ref 8.4–10.5)
Chloride: 102 mEq/L (ref 96–112)
Creatinine, Ser: 0.99 mg/dL (ref 0.40–1.50)
GFR: 82.05 mL/min (ref >60.00)
Glucose, Bld: 101 mg/dL — ABNORMAL HIGH (ref 70–99)
Potassium: 4.1 mEq/L (ref 3.5–5.1)
Sodium: 139 mEq/L (ref 135–145)

## 2022-01-04 LAB — TSH: TSH: 2.96 u[IU]/mL (ref 0.35–5.50)

## 2022-01-04 LAB — PSA: PSA: 4.68 ng/mL — ABNORMAL HIGH (ref 0.10–4.00)

## 2022-01-04 LAB — FOLATE: Folate: 16.7 ng/mL (ref >5.9)

## 2022-01-04 MED ORDER — CYANOCOBALAMIN 1000 MCG/ML IJ SOLN: INTRAMUSCULAR | 3 refills | Status: DC

## 2022-01-04 MED ORDER — CYANOCOBALAMIN 1000 MCG/ML IJ SOLN
1000.0000 ug | Freq: Once | INTRAMUSCULAR | Status: AC
  Administered 2022-01-04: 1000 ug via INTRAMUSCULAR

## 2022-01-04 MED ORDER — FLUTICASONE PROPIONATE 50 MCG/ACT NA SUSP: 2.0000 | Freq: Every day | NASAL | 1 refills | Status: DC

## 2022-01-04 MED ORDER — LEVOCETIRIZINE DIHYDROCHLORIDE 5 MG PO TABS: 5.0000 mg | ORAL_TABLET | Freq: Every evening | ORAL | 1 refills | Status: AC

## 2022-01-04 NOTE — Patient Instructions (Signed)

## 2022-01-04 NOTE — Progress Notes (Signed)
Brendan Short D.[place] Phone: [phone]   Assessment and Plan:     1. Chronic SI joint pain 2. Chronic bilateral low back pain without sciatica 3. Somatic dysfunction of lumbar region 4. Somatic dysfunction of pelvic region 5. Somatic dysfunction of sacral region -Chronic with exacerbation, subsequent visit - Recurrence of multiple musculoskeletal complaints with most prominent being in low back and SI joints - Patient has received significant relief with OMT in the past.  Elects for repeat OMT today.  Tolerated well per note below. - Decision today to treat with OMT was based on Physical Exam   After verbal consent patient was treated with HVLA (high velocity low amplitude), ME (muscle energy), FPR (flex positional release), ST (soft tissue), PC/PD (Pelvic Compression/ Pelvic Decompression) techniques in cervical, rib, thoracic, lumbar, and pelvic areas. Patient tolerated the procedure well with improvement in symptoms.  Patient educated on potential side effects of soreness and recommended to rest, hydrate, and use Tylenol as needed for pain control.   Pertinent previous records reviewed include none   Follow Up: 4 weeks for OMT maintenance   Subjective:   I, Brendan Short, am serving as a Education administrator for Brendan Short  Chief Complaint: low back pain   HPI:  09/20/21 Patient is a 61 year old male presenting with low back pain that has been going on for the past 2 years with 2 flares in the past month. Patient saw his PCP 09/16/21 and 08/02/21 for this reason and has since been referred to sports medicine for evaluation and treatment. Patient locates pain to both the left and right side of the lower back and has been trying to be careful when lifting things at work. Patient is a Development worker, international aid.    Radiates: yes down the right upper leg Numbness/tingling:no Weakness:no Aggravates: bending and reaching. 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  Current Outpatient Medications  Medication Sig Dispense Refill   azelastine (OPTIVAR) 0.05 % ophthalmic solution Place 1 drop into both eyes 2 (two) times daily. 6 mL 12   cholecalciferol (VITAMIN D) 1000 UNITS tablet Take 1,000 Units by mouth daily.     colchicine 0.6 MG tablet TAKE 1 TABLET BY MOUTH EVERY OTHER DAY 15 tablet 1   cyclobenzaprine (FLEXERIL) 5 MG tablet Take 1 tablet (5 mg total) by mouth at bedtime as needed for muscle spasms. 40 tablet 2   diclofenac (VOLTAREN) 0.1 % ophthalmic solution 4 (four) times daily. PRN     fluticasone (FLONASE) 50 MCG/ACT nasal spray Place 2 sprays into both nostrils daily. 48 g 1   glucosamine-chondroitin 500-400 MG tablet Take 1 tablet by mouth 3 (three) times daily.     ketoconazole (NIZORAL) 2 % cream Apply 1 application topically 2 (two) times daily.     levocetirizine (XYZAL) 5 MG tablet Take 1 tablet (5 mg total) by mouth every evening. 90 tablet 1   naproxen (NAPROSYN) 500 MG tablet Take 500  mg by mouth 2 (two) times daily.     probenecid (BENEMID) 500 MG tablet TAKE 1 TABLET BY MOUTH TWICE A DAY 60 tablet 2   Turmeric 500 MG TABS Take by mouth.     No current facility-administered medications for this visit.      Objective:     Vitals:   01/05/22 1521  BP: 122/80  Pulse: 89  SpO2: 97%  Weight: 208 lb (94.3 kg)  Height: 6' 3"  (1.905 m)      Body mass index is 26 kg/m.    Physical Exam:     General:  Well-appearing, cooperative, sitting comfortably in no acute distress.   OMT Physical Exam:  ASIS Compression Test: Positive Right Sacrum: Positive sphinx, TTP bilateral sacral base, worse on left Lumbar: TTP paraspinal, L1-3 RRSL Pelvis: Right anterior innominate with out flare  Electronically signed by:  Brendan Short D.[facility] 4:34 PM 01/05/22

## 2022-01-04 NOTE — Progress Notes (Signed)
Subjective:  Patient ID: Brendan Short, male    DOB: [birth year]  Age: 61 y.o. MRN: [id number]  CC: Annual Exam and Hypertension  This visit occurred during the SARS-CoV-2 public health emergency.  Safety protocols were in place, including screening questions prior to the visit, additional usage of staff PPE, and extensive cleaning of exam room while observing appropriate contact time as indicated for disinfecting solutions.    HPI Brendan Short presents for a CPX and f/up -   He is active and denies CP, DOE, SOB, edema, or fatigue.  Outpatient Medications Prior to Visit  Medication Sig Dispense Refill   azelastine (OPTIVAR) 0.05 % ophthalmic solution Place 1 drop into both eyes 2 (two) times daily. 6 mL 12   cholecalciferol (VITAMIN D) 1000 UNITS tablet Take 1,000 Units by mouth daily.     colchicine 0.6 MG tablet TAKE 1 TABLET BY MOUTH EVERY OTHER DAY 15 tablet 1   cyclobenzaprine (FLEXERIL) 5 MG tablet Take 1 tablet (5 mg total) by mouth at bedtime as needed for muscle spasms. 40 tablet 2   diclofenac (VOLTAREN) 0.1 % ophthalmic solution 4 (four) times daily. PRN     glucosamine-chondroitin 500-400 MG tablet Take 1 tablet by mouth 3 (three) times daily.     ketoconazole (NIZORAL) 2 % cream Apply 1 application topically 2 (two) times daily.     naproxen (NAPROSYN) 500 MG tablet Take 500 mg by mouth 2 (two) times daily.     probenecid (BENEMID) 500 MG tablet TAKE 1 TABLET BY MOUTH TWICE A DAY 60 tablet 2   Turmeric 500 MG TABS Take by mouth.     indomethacin (INDOCIN) 25 MG capsule TAKE 1 CAPSULE BY MOUTH 3 TIMES DAILY WITH MEALS. USE DURING GOUT FLARE IF NEEDED. 30 capsule 0   predniSONE (DELTASONE) 50 MG tablet Take 1 tablet (50 mg total) by mouth daily with breakfast. 5 tablet 0   No facility-administered medications prior to visit.    ROS Review of Systems  Constitutional: Negative.  Negative for diaphoresis, fatigue and unexpected weight change.  HENT:  Positive for congestion,  postnasal drip and rhinorrhea. Negative for nosebleeds, sinus pressure, sneezing, sore throat, tinnitus, trouble swallowing and voice change.   Eyes: Negative.   Respiratory: Negative.  Negative for cough, shortness of breath and wheezing.   Cardiovascular:  Negative for chest pain, palpitations and leg swelling.  Gastrointestinal:  Negative for abdominal pain, constipation, diarrhea, nausea and vomiting.  Endocrine: Negative.   Genitourinary: Negative.  Negative for difficulty urinating, dysuria, scrotal swelling and testicular pain.  Musculoskeletal:  Positive for arthralgias and back pain. Negative for myalgias and neck pain.  Skin: Negative.  Negative for color change and rash.  Neurological:  Negative for dizziness, weakness, light-headedness, numbness and headaches.  Hematological:  Negative for adenopathy. Does not bruise/bleed easily.  Psychiatric/Behavioral: Negative.     Objective:  BP (!) 158/96 (BP Location: Left Arm, Patient Position: Sitting, Cuff Size: Large) Comment: (R) 158/96 (L) 148/94   Pulse 80    Temp 98.2 F (36.8 C) (Oral)    Resp 16    Ht 6' 3"  (1.905 m)    Wt 209 lb (94.8 kg)    SpO2 98%    BMI 26.12 kg/m   BP Readings from Last 3 Encounters:  01/05/22 122/80  01/04/22 (!) 158/96  10/18/21 130/84    Wt Readings from Last 3 Encounters:  01/05/22 208 lb (94.3 kg)  01/04/22 209 lb (94.8 kg)  10/18/21  207 lb (93.9 kg)    Physical Exam Vitals reviewed.  HENT:     Nose: Nose normal.     Mouth/Throat:     Mouth: Mucous membranes are moist.  Eyes:     General: No scleral icterus.    Conjunctiva/sclera: Conjunctivae normal.  Cardiovascular:     Rate and Rhythm: Normal rate and regular rhythm.     Heart sounds: Normal heart sounds, S1 normal and S2 normal.    No friction rub. No gallop.     Comments: EKG- NSR, 61 No LVH Normal EKG Pulmonary:     Effort: Pulmonary effort is normal.     Breath sounds: No stridor. No wheezing, rhonchi or rales.   Abdominal:     General: Abdomen is flat.     Palpations: There is no mass.     Tenderness: There is no abdominal tenderness. There is no guarding or rebound.     Hernia: No hernia is present. There is no hernia in the left inguinal area or right inguinal area.  Genitourinary:    Pubic Area: No rash.      Penis: Normal and circumcised.      Testes: Normal.     Epididymis:     Right: Normal.     Left: Normal.     Prostate: Enlarged. Not tender and no nodules present.     Rectum: Normal. Guaiac result negative. No mass, tenderness, anal fissure, external hemorrhoid or internal hemorrhoid.  Musculoskeletal:     Cervical back: Neck supple.     Right lower leg: No edema.     Left lower leg: No edema.  Lymphadenopathy:     Cervical: No cervical adenopathy.     Lower Body: No right inguinal adenopathy. No left inguinal adenopathy.  Skin:    General: Skin is warm and dry.  Neurological:     General: No focal deficit present.     Mental Status: He is alert.  Psychiatric:        Mood and Affect: Mood normal.        Behavior: Behavior normal.    Lab Results  Component Value Date   WBC 3.6 (L) 01/04/2022   HGB 14.2 01/04/2022   HCT 41.4 01/04/2022   PLT 116.0 (L) 01/04/2022   GLUCOSE 101 (H) 01/04/2022   CHOL 192 01/04/2022   TRIG 181.0 (H) 01/04/2022   HDL 42.00 01/04/2022   LDLCALC 114 (H) 01/04/2022   ALT 20 10/22/2020   AST 20 10/22/2020   NA 139 01/04/2022   K 4.1 01/04/2022   CL 102 01/04/2022   CREATININE 0.99 01/04/2022   BUN 19 01/04/2022   CO2 32 01/04/2022   TSH 2.96 01/04/2022   PSA 4.68 (H) 01/04/2022   HGBA1C 5.6 10/22/2020    DG Toe 3rd Right  Result Date: 04/21/2019 CLINICAL DATA:  Right third toe pain EXAM: RIGHT THIRD TOE COMPARISON:  None. FINDINGS: No acute fracture or dislocation. No aggressive osseous lesion. 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 No aggressive osseous lesion. No erosive changes. Mild mineralization along the periphery of the  third PIP joint as can be seen with CPPD or gout. IMPRESSION: 1. No acute osseous injury of the right third toe. 2. Mild mineralization along the periphery of the third PIP joint as can be seen with CPPD or gout. No erosive changes. Electronically Signed   By: Kathreen Devoid   On: 04/21/2019 08:49    Assessment & Plan:   Sade was seen today  for annual exam and hypertension.  Diagnoses and all orders for this visit:  Thrombocytopenia ([place])- His platelet count is stable.  There is no history of bleeding or bruising.  Will continue to treat the B12 deficiency. -     CBC with Differential/Platelet; Future -     Folate; Future -     Folate -     CBC with Differential/Platelet  Vitamin B12 deficiency anemia due to intrinsic factor deficiency -     CBC with Differential/Platelet; Future -     Folate; Future -     Discontinue: cyanocobalamin (,VITAMIN B-12,) 1000 MCG/ML injection; Inject 50m in deltoid once weekly for 4 weeks, then inject 1 ml once a month thereafter -     cyanocobalamin ((VITAMIN B-12)) injection 1,000 mcg -     Folate -     CBC with Differential/Platelet  Idiopathic chronic gout of multiple sites without tophus  Encounter for general adult medical examination with abnormal findings- Exam completed, labs reviewed, vaccines reviewed and updated, cancer screenings are up-to-date, patient education was given. -     Lipid panel; Future -     PSA; Future -     PSA -     Lipid panel  Primary hypertension- His blood pressure is not adequately well controlled.  Will check labs to screen for secondary causes and endorgan damage.  His EKG is reassuring. -     Basic metabolic panel; Future -     Aldosterone + renin activity w/ ratio; Future -     TSH; Future -     Urinalysis, Routine w reflex microscopic; Future -     EKG 12-Lead -     Urinalysis, Routine w reflex microscopic -     TSH -     Aldosterone + renin activity w/ ratio -     Basic metabolic panel  Seasonal allergic  rhinitis due to pollen -     levocetirizine (XYZAL) 5 MG tablet; Take 1 tablet (5 mg total) by mouth every evening. -     fluticasone (FLONASE) 50 MCG/ACT nasal spray; Place 2 sprays into both nostrils daily.  Need for shingles vaccine -     Varicella-zoster vaccine IM (Shingrix)  Unequal blood pressure in upper extremities- Will screen for coarctation of the aorta and TAA. -     CT ANGIO CHEST AORTA W/CM & OR WO/CM; Future  PSA elevation- Will recheck this in 3 months.   I have discontinued Tayveon W. Hemmerich's indomethacin and predniSONE. I am also having him start on levocetirizine and fluticasone. Additionally, I am having him maintain his cholecalciferol, diclofenac, Turmeric, glucosamine-chondroitin, ketoconazole, azelastine, naproxen, cyclobenzaprine, probenecid, and colchicine. We administered cyanocobalamin.  Meds ordered this encounter  Medications   levocetirizine (XYZAL) 5 MG tablet    Sig: Take 1 tablet (5 mg total) by mouth every evening.    Dispense:  90 tablet    Refill:  1   fluticasone (FLONASE) 50 MCG/ACT nasal spray    Sig: Place 2 sprays into both nostrils daily.    Dispense:  48 g    Refill:  1   DISCONTD: cyanocobalamin (,VITAMIN B-12,) 1000 MCG/ML injection    Sig: Inject 125min deltoid once weekly for 4 weeks, then inject 1 ml once a month thereafter    Dispense:  10 mL    Refill:  3   cyanocobalamin ((VITAMIN B-12)) injection 1,000 mcg     Follow-up: Return in about 3 months (around 04/03/2022).  ThScarlette Calico  MD

## 2022-01-05 ENCOUNTER — Ambulatory Visit (INDEPENDENT_AMBULATORY_CARE_PROVIDER_SITE_OTHER): Payer: 59 | Admitting: Sports Medicine

## 2022-01-05 VITALS — BP 122/80 | HR 89 | Ht 75.0 in | Wt 208.0 lb

## 2022-01-05 DIAGNOSIS — M533 Sacrococcygeal disorders, not elsewhere classified: Secondary | ICD-10-CM

## 2022-01-05 DIAGNOSIS — R972 Elevated prostate specific antigen [PSA]: Secondary | ICD-10-CM | POA: Insufficient documentation

## 2022-01-05 DIAGNOSIS — G8929 Other chronic pain: Secondary | ICD-10-CM | POA: Diagnosis not present

## 2022-01-05 DIAGNOSIS — M9905 Segmental and somatic dysfunction of pelvic region: Secondary | ICD-10-CM

## 2022-01-05 DIAGNOSIS — M545 Low back pain, unspecified: Secondary | ICD-10-CM | POA: Diagnosis not present

## 2022-01-05 DIAGNOSIS — M9903 Segmental and somatic dysfunction of lumbar region: Secondary | ICD-10-CM

## 2022-01-05 DIAGNOSIS — M9904 Segmental and somatic dysfunction of sacral region: Secondary | ICD-10-CM

## 2022-01-05 LAB — URINALYSIS, ROUTINE W REFLEX MICROSCOPIC
Bilirubin Urine: NEGATIVE
Hgb urine dipstick: NEGATIVE
Ketones, ur: NEGATIVE
Leukocytes,Ua: NEGATIVE
Nitrite: NEGATIVE
RBC / HPF: NONE SEEN (ref 0–?)
Specific Gravity, Urine: 1.02 (ref 1.000–1.030)
Total Protein, Urine: NEGATIVE
Urine Glucose: NEGATIVE
Urobilinogen, UA: 0.2 (ref 0.0–1.0)
pH: 5.5 (ref 5.0–8.0)

## 2022-01-05 NOTE — Patient Instructions (Signed)
Good to see you

## 2022-01-10 ENCOUNTER — Other Ambulatory Visit: Payer: Self-pay | Admitting: Internal Medicine

## 2022-01-10 DIAGNOSIS — M1A09X0 Idiopathic chronic gout, multiple sites, without tophus (tophi): Secondary | ICD-10-CM

## 2022-01-10 LAB — ALDOSTERONE + RENIN ACTIVITY W/ RATIO
ALDO / PRA Ratio: 5.7 Ratio (ref 0.9–28.9)
Aldosterone: 3 ng/dL
Renin Activity: 0.53 ng/mL/h (ref 0.25–5.82)

## 2022-01-24 ENCOUNTER — Ambulatory Visit (INDEPENDENT_AMBULATORY_CARE_PROVIDER_SITE_OTHER)
Admission: RE | Admit: 2022-01-24 | Discharge: 2022-01-24 | Disposition: A | Discharge status: Home / Self Care | Payer: 59 | Source: Ambulatory Visit | Attending: Internal Medicine | Admitting: Internal Medicine

## 2022-01-24 ENCOUNTER — Other Ambulatory Visit: Payer: Self-pay

## 2022-01-24 DIAGNOSIS — R0989 Other specified symptoms and signs involving the circulatory and respiratory systems: Secondary | ICD-10-CM | POA: Diagnosis not present

## 2022-01-24 MED ORDER — IOHEXOL 350 MG/ML SOLN
100.0000 mL | Freq: Once | INTRAVENOUS | Status: AC | PRN
  Administered 2022-01-24: 100 mL via INTRAVENOUS

## 2022-01-25 NOTE — Progress Notes (Signed)
? ? Brendan Short D.Brendan Short ?[facility] ?[place] ?Phone: [phone] ?  ?Assessment and Plan:   ?  ?1. Neck pain ?2. Chronic bilateral low back pain without sciatica ?3. Somatic dysfunction of lumbar region ?4. Somatic dysfunction of pelvic region ?5. Somatic dysfunction of cervical region ?6. Somatic dysfunction of thoracic region ?7. Somatic dysfunction of rib region ?-Chronic with exacerbation, subsequent visit ?- Recurrence of multiple musculoskeletal complaints with most prominent being in low back and SI joints.  New discomfort in neck today that was relieved after OMT ?- Patient has received significant relief with OMT in the past.  Elects for repeat OMT today.  Tolerated well per note below. ?- Decision today to treat with OMT was based on Physical Exam ? ?After verbal consent patient was treated with HVLA (high velocity low amplitude), ME (muscle energy), FPR (flex positional release), ST (soft tissue), PC/PD (Pelvic Compression/ Pelvic Decompression) techniques in cervical, rib, thoracic, lumbar, and pelvic areas. Patient tolerated the procedure well with improvement in symptoms.  Patient educated on potential side effects of soreness and recommended to rest, hydrate, and use Tylenol as needed for pain control.  ?  ?Pertinent previous records reviewed include none ?  ?Follow Up: 4 weeks for repeat OMT maintenance ?  ?Subjective:   ?I, Brendan Short, am serving as a Education administrator for Doctor Brendan Short ? ?Chief Complaint: SI joint pain  ? ?HPI:  ?09/20/21 ?Patient is a 61 year old male presenting with low back pain that has been going on for the past 2 years with 2 flares in the past month. Patient saw his PCP 09/16/21 and 08/02/21 for this reason and has since been referred to sports medicine for evaluation and treatment. Patient locates pain to both the left and right side of the lower back and has been trying to be careful when lifting things at  work. Patient is a Development worker, international aid.  ?  ?Radiates: yes down the right upper leg ?Numbness/tingling:no ?Weakness:no ?Aggravates: bending and reaching. Or twisting  ?Treatments tried:prednisone, flexeril, back exercises, solanpas, ice, tens unit  ?  ?10/18/21 ?Patient states that exercises are helping a lot able to do more bending and twisting stretching in the morning and at night sees a big difference  ?  ?01/05/2022 ?Patient states that he is not feeling well but he has been feeling really good he is now stiff needs a tune up  ?  ? 01/26/2022 ?Patient states that he's been doing good got a little tightness in his neck , needs a tune up  ? ? ? 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 ?Additional pertinent review of systems negative. ? ? ?Current Outpatient Medications:  ?  azelastine (OPTIVAR) 0.05 % ophthalmic solution, Place 1 drop into both eyes 2 (two) times daily., Disp: 6 mL, Rfl: 12 ?  cholecalciferol (VITAMIN D) 1000 UNITS tablet, Take 1,000 Units by mouth daily., Disp: , Rfl:  ?  colchicine 0.6 MG tablet, TAKE 1 TABLET BY MOUTH EVERY OTHER DAY, Disp: 15 tablet, Rfl: 1 ?  cyclobenzaprine (FLEXERIL) 5 MG tablet, Take 1 tablet (5 mg total) by mouth at bedtime as needed for muscle spasms., Disp: 40 tablet, Rfl: 2 ?  diclofenac (VOLTAREN) 0.1 % ophthalmic solution, 4 (four) times daily. PRN, Disp: , Rfl:  ?  fluticasone (FLONASE) 50 MCG/ACT nasal spray, Place 2 sprays into both nostrils daily., Disp: 48 g, Rfl: 1 ?  glucosamine-chondroitin 500-400 MG tablet,  Take 1 tablet by mouth 3 (three) times daily., Disp: , Rfl:  ?  ketoconazole (NIZORAL) 2 % cream, Apply 1 application topically 2 (two) times daily., Disp: , Rfl:  ?  levocetirizine (XYZAL) 5 MG tablet, Take 1 tablet (5 mg total) by mouth every evening., Disp: 90 tablet, Rfl: 1 ?  naproxen (NAPROSYN) 500 MG tablet, Take 500 mg by mouth 2 (two) times daily., Disp: , Rfl:  ?  probenecid (BENEMID) 500 MG tablet, TAKE  1 TABLET BY MOUTH TWICE A DAY, Disp: 60 tablet, Rfl: 2 ?  Turmeric 500 MG TABS, Take by mouth., Disp: , Rfl:   ? ?Objective:   ?  ?Vitals:  ? 01/26/22 1524  ?BP: 110/72  ?Pulse: 62  ?SpO2: 97%  ?Weight: 207 lb (93.9 kg)  ?Height: 6' 3"  (1.905 m)  ?  ?  ?Body mass index is 25.87 kg/m?.  ?  ?Physical Exam:   ? ?General: Well-appearing, cooperative, sitting comfortably in no acute distress.  ? ?OMT Physical Exam: ? ?ASIS Compression Test: Positive Right ?Cervical: TTP paraspinal, C4 RRSL ?Rib: Bilateral elevated first rib with TTP ?Thoracic: TTP paraspinal, T5-7 RRSL ?Lumbar: TTP paraspinal, L1-3 RLSR ?Pelvis: Right anterior innominate  ? ? ?Electronically signed by:  ?Brendan Short D.Brendan Short ?[facility] ?4:34 PM 01/26/22 ?

## 2022-01-26 ENCOUNTER — Ambulatory Visit (INDEPENDENT_AMBULATORY_CARE_PROVIDER_SITE_OTHER): Payer: 59 | Admitting: Sports Medicine

## 2022-01-26 ENCOUNTER — Other Ambulatory Visit: Payer: Self-pay

## 2022-01-26 VITALS — BP 110/72 | HR 62 | Ht 75.0 in | Wt 207.0 lb

## 2022-01-26 DIAGNOSIS — M9903 Segmental and somatic dysfunction of lumbar region: Secondary | ICD-10-CM | POA: Diagnosis not present

## 2022-01-26 DIAGNOSIS — M9905 Segmental and somatic dysfunction of pelvic region: Secondary | ICD-10-CM | POA: Diagnosis not present

## 2022-01-26 DIAGNOSIS — M542 Cervicalgia: Secondary | ICD-10-CM

## 2022-01-26 DIAGNOSIS — M9908 Segmental and somatic dysfunction of rib cage: Secondary | ICD-10-CM

## 2022-01-26 DIAGNOSIS — G8929 Other chronic pain: Secondary | ICD-10-CM

## 2022-01-26 DIAGNOSIS — M545 Low back pain, unspecified: Secondary | ICD-10-CM

## 2022-01-26 DIAGNOSIS — M9902 Segmental and somatic dysfunction of thoracic region: Secondary | ICD-10-CM

## 2022-01-26 DIAGNOSIS — M9901 Segmental and somatic dysfunction of cervical region: Secondary | ICD-10-CM

## 2022-01-26 NOTE — Patient Instructions (Addendum)
Good to see you  ?4 week follow up for repeat omt  ?

## 2022-02-01 ENCOUNTER — Other Ambulatory Visit: Payer: Self-pay | Admitting: Internal Medicine

## 2022-02-01 DIAGNOSIS — M1A09X0 Idiopathic chronic gout, multiple sites, without tophus (tophi): Secondary | ICD-10-CM

## 2022-02-19 ENCOUNTER — Other Ambulatory Visit: Payer: Self-pay | Admitting: Internal Medicine

## 2022-02-19 DIAGNOSIS — M1A09X0 Idiopathic chronic gout, multiple sites, without tophus (tophi): Secondary | ICD-10-CM

## 2022-02-19 DIAGNOSIS — M109 Gout, unspecified: Secondary | ICD-10-CM

## 2022-02-22 NOTE — Progress Notes (Signed)
? Benito Mccreedy D.Merril Abbe ?[facility] ?[place] ?Phone: [phone] ?  ?Assessment and Plan:   ?  ?1. Neck pain ?2. Chronic bilateral low back pain with right sciatica ?3. Somatic dysfunction of cervical region ?4. Somatic dysfunction of thoracic region ?5. Somatic dysfunction of lumbar region ?6. Somatic dysfunction of pelvic region ?7. Somatic dysfunction of rib region ?-Chronic with exacerbation, subsequent visit ?- Recurrence of multiple musculoskeletal complaints with most prominent being right-sided low back with right-sided sciatica ?- Start HEP for sciatica ?- Patient has received significant relief with OMT in the past.  Elects for repeat OMT today.  Tolerated well per note below. ?- Decision today to treat with OMT was based on Physical Exam ?  ?After verbal consent patient was treated with HVLA (high velocity low amplitude), ME (muscle energy), FPR (flex positional release), ST (soft tissue), PC/PD (Pelvic Compression/ Pelvic Decompression) techniques in cervical, rib, thoracic, lumbar, and pelvic areas. Patient tolerated the procedure well with improvement in symptoms.  Patient educated on potential side effects of soreness and recommended to rest, hydrate, and use Tylenol as needed for pain control. ?  ?Pertinent previous records reviewed include none ?  ?Follow Up: 3 to 4 weeks for reevaluation and repeat OMT maintenance.  Could consider lumbar spine imaging versus NSAID course if no improvement or worsening of sciatica-like symptoms ?  ?Subjective:   ?I, [name], am serving as a Education administrator for Doctor [employer] ?  ?Chief Complaint: SI joint pain  ?  ?HPI:  ?09/20/21 ?Patient is a 61 year old male presenting with low back pain that has been going on for the past 2 years with 2 flares in the past month. Patient saw his PCP 09/16/21 and 08/02/21 for this reason and has since been referred to sports medicine for evaluation and treatment. Patient locates  pain to both the left and right side of the lower back and has been trying to be careful when lifting things at work. Patient is a Development worker, international aid.  ?  ?Radiates: yes down the right upper leg ?Numbness/tingling:no ?Weakness:no ?Aggravates: bending and reaching. Or twisting  ?Treatments tried:prednisone, flexeril, back exercises, solanpas, ice, tens unit  ?  ?10/18/21 ?Patient states that exercises are helping a lot able to do more bending and twisting stretching in the morning and at night sees a big difference  ?  ?01/05/2022 ?Patient states that he is not feeling well but he has been feeling really good he is now stiff needs a tune up  ?  ? 01/26/2022 ?Patient states that he's been doing good got a little tightness in his neck , needs a tune up  ? ?02/23/2022 ?Patient states that he has been good , but now he has some butt pain that goes down his leg when he gets in the trunk it gets really tight that happens every time around this year he starts doing more weed eating stuff, cant get comfortable when he sits in the truck for a long time   ?  ?  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 ?Additional pertinent review of systems negative. ? ?Current Outpatient Medications  ?Medication Sig Dispense Refill  ? azelastine (OPTIVAR) 0.05 % ophthalmic solution Place 1 drop into both eyes 2 (two) times daily. 6 mL 12  ? cholecalciferol (VITAMIN D) 1000 UNITS tablet Take 1,000 Units by mouth daily.    ? colchicine 0.6 MG tablet TAKE 1 TABLET BY MOUTH  EVERY OTHER DAY 15 tablet 1  ? cyclobenzaprine (FLEXERIL) 5 MG tablet Take 1 tablet (5 mg total) by mouth at bedtime as needed for muscle spasms. 40 tablet 2  ? diclofenac (VOLTAREN) 0.1 % ophthalmic solution 4 (four) times daily. PRN    ? fluticasone (FLONASE) 50 MCG/ACT nasal spray Place 2 sprays into both nostrils daily. 48 g 1  ? glucosamine-chondroitin 500-400 MG tablet Take 1 tablet by mouth 3 (three) times daily.    ?  ketoconazole (NIZORAL) 2 % cream Apply 1 application topically 2 (two) times daily.    ? levocetirizine (XYZAL) 5 MG tablet Take 1 tablet (5 mg total) by mouth every evening. 90 tablet 1  ? naproxen (NAPROSYN) 500 MG tablet Take 500 mg by mouth 2 (two) times daily.    ? probenecid (BENEMID) 500 MG tablet TAKE 1 TABLET BY MOUTH TWICE A DAY 60 tablet 2  ? Turmeric 500 MG TABS Take by mouth.    ? ?No current facility-administered medications for this visit.  ?  ?  ?Objective:   ?  ?Vitals:  ? 02/23/22 1522  ?BP: 128/72  ?Pulse: 80  ?SpO2: 96%  ?Weight: 203 lb (92.1 kg)  ?Height: 6' 3"  (1.905 m)  ?  ?  ?Body mass index is 25.37 kg/m?.  ?  ?Physical Exam:   ?  ?General: Well-appearing, cooperative, sitting comfortably in no acute distress.  ? ?OMT Physical Exam: ? ?ASIS Compression Test: Positive Right ?Cervical: TTP paraspinal, C3 RRSL ?Rib: Bilateral elevated first rib with nTTP ?Thoracic: TTP paraspinal, T4-7 RRSL ?Lumbar: TTP paraspinal, L1-3 RLSR ?Pelvis: Right anterior innominate ?Positive piriformis test on right ? ?Electronically signed by:  ?Benito Mccreedy D.Merril Abbe ?[facility] ?4:16 PM 02/23/22 ?

## 2022-02-23 ENCOUNTER — Ambulatory Visit: Payer: 59 | Admitting: Sports Medicine

## 2022-02-23 VITALS — BP 128/72 | HR 80 | Ht 75.0 in | Wt 203.0 lb

## 2022-02-23 DIAGNOSIS — M9905 Segmental and somatic dysfunction of pelvic region: Secondary | ICD-10-CM

## 2022-02-23 DIAGNOSIS — M5441 Lumbago with sciatica, right side: Secondary | ICD-10-CM | POA: Diagnosis not present

## 2022-02-23 DIAGNOSIS — M9901 Segmental and somatic dysfunction of cervical region: Secondary | ICD-10-CM

## 2022-02-23 DIAGNOSIS — M9902 Segmental and somatic dysfunction of thoracic region: Secondary | ICD-10-CM | POA: Diagnosis not present

## 2022-02-23 DIAGNOSIS — M9908 Segmental and somatic dysfunction of rib cage: Secondary | ICD-10-CM

## 2022-02-23 DIAGNOSIS — M9903 Segmental and somatic dysfunction of lumbar region: Secondary | ICD-10-CM

## 2022-02-23 DIAGNOSIS — M542 Cervicalgia: Secondary | ICD-10-CM

## 2022-02-23 DIAGNOSIS — G8929 Other chronic pain: Secondary | ICD-10-CM

## 2022-02-23 NOTE — Patient Instructions (Addendum)
Good to see you  ?Piriformis and sciatica exercises given  ?3-4 week follow up for repeat OMT ?

## 2022-02-25 ENCOUNTER — Other Ambulatory Visit: Payer: Self-pay | Admitting: Internal Medicine

## 2022-02-25 DIAGNOSIS — M1A09X0 Idiopathic chronic gout, multiple sites, without tophus (tophi): Secondary | ICD-10-CM

## 2022-03-17 NOTE — Progress Notes (Signed)
? Benito Mccreedy D.Merril Abbe ?[facility] ?[place] ?Phone: [phone] ?  ?Assessment and Plan:   ?  ?1. Neck pain ?2. Chronic bilateral low back pain with right-sided sciatica ?3. Somatic dysfunction of cervical region ?4. Somatic dysfunction of thoracic region ?5. Somatic dysfunction of lumbar region ?6. Somatic dysfunction of pelvic region ?7. Somatic dysfunction of rib region ?-Chronic with exacerbation, subsequent visit ?- Overall improvement in multiple musculoskeletal complaints with regular OMT visits, however patient has had moderate flare of neck and low back pain with busy and physically demanding work over the past 1 week ?- Start meloxicam 15 mg daily x2 weeks.  If still having pain after 2 weeks, complete 3rd-week of meloxicam. May use remaining meloxicam as needed once daily for pain control.  Do not to use additional NSAIDs while taking meloxicam.  May use Tylenol [phone] mg 2 to 3 times a day for breakthrough pain.  ?- Patient has received significant relief with OMT in the past.  Elects for repeat OMT today.  Tolerated well per note below. ?- Decision today to treat with OMT was based on Physical Exam ?  ?After verbal consent patient was treated with HVLA (high velocity low amplitude), ME (muscle energy), FPR (flex positional release), ST (soft tissue), PC/PD (Pelvic Compression/ Pelvic Decompression) techniques in cervical, rib, thoracic, lumbar, and pelvic areas. Patient tolerated the procedure well with improvement in symptoms.  Patient educated on potential side effects of soreness and recommended to rest, hydrate, and use Tylenol as needed for pain control. ?  ?Pertinent previous records reviewed include none ?  ?Follow Up: 4 weeks for repeat OMT maintenance ?  ?Subjective:   ?I, [name], am serving as a Education administrator for Doctor [employer] ?  ?Chief Complaint: SI joint pain  ?  ?HPI:  ?09/20/21 ?Patient is a 61 year old male presenting with low  back pain that has been going on for the past 2 years with 2 flares in the past month. Patient saw his PCP 09/16/21 and 08/02/21 for this reason and has since been referred to sports medicine for evaluation and treatment. Patient locates pain to both the left and right side of the lower back and has been trying to be careful when lifting things at work. Patient is a Development worker, international aid.  ?  ?Radiates: yes down the right upper leg ?Numbness/tingling:no ?Weakness:no ?Aggravates: bending and reaching. Or twisting  ?Treatments tried:prednisone, flexeril, back exercises, solanpas, ice, tens unit  ?  ?10/18/21 ?Patient states that exercises are helping a lot able to do more bending and twisting stretching in the morning and at night sees a big difference  ?  ?01/05/2022 ?Patient states that he is not feeling well but he has been feeling really good he is now stiff needs a tune up  ?  ? 01/26/2022 ?Patient states that he's been doing good got a little tightness in his neck , needs a tune up  ?  ?02/23/2022 ?Patient states that he has been good , but now he has some butt pain that goes down his leg when he gets in the truck it gets really tight that happens every time around this year he starts doing more weed eating stuff, cant get comfortable when he sits in the truck for a long time   ? ?03/20/2022 ?Patient states that he has been working real hard so its been kind of rough has been practicing with his golf game , last week he had a  few night that he had to take flexeril and use his solnpas patch he is planning on retiring soon , some neck tightness on his left side  ?  ?  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 ?Additional pertinent review of systems negative. ? ?Current Outpatient Medications  ?Medication Sig Dispense Refill  ? azelastine (OPTIVAR) 0.05 % ophthalmic solution Place 1 drop into both eyes 2 (two) times daily. 6 mL 12  ? cholecalciferol (VITAMIN D) 1000 UNITS tablet  Take 1,000 Units by mouth daily.    ? colchicine 0.6 MG tablet TAKE 1 TABLET BY MOUTH EVERY OTHER DAY 15 tablet 1  ? cyclobenzaprine (FLEXERIL) 5 MG tablet Take 1 tablet (5 mg total) by mouth at bedtime as needed for muscle spasms. 40 tablet 2  ? diclofenac (VOLTAREN) 0.1 % ophthalmic solution 4 (four) times daily. PRN    ? fluticasone (FLONASE) 50 MCG/ACT nasal spray Place 2 sprays into both nostrils daily. 48 g 1  ? glucosamine-chondroitin 500-400 MG tablet Take 1 tablet by mouth 3 (three) times daily.    ? ketoconazole (NIZORAL) 2 % cream Apply 1 application topically 2 (two) times daily.    ? levocetirizine (XYZAL) 5 MG tablet Take 1 tablet (5 mg total) by mouth every evening. 90 tablet 1  ? naproxen (NAPROSYN) 500 MG tablet Take 500 mg by mouth 2 (two) times daily.    ? probenecid (BENEMID) 500 MG tablet TAKE 1 TABLET BY MOUTH TWICE A DAY 60 tablet 2  ? Turmeric 500 MG TABS Take by mouth.    ? ?No current facility-administered medications for this visit.  ?  ?  ?Objective:   ?  ?Vitals:  ? 03/20/22 1528  ?BP: 118/78  ?Pulse: 79  ?SpO2: 95%  ?Weight: 204 lb (92.5 kg)  ?Height: 6' 3"  (1.905 m)  ?  ?  ?Body mass index is 25.5 kg/m?.  ?  ?Physical Exam:   ?  ?General: Well-appearing, cooperative, sitting comfortably in no acute distress.  ? ?OMT Physical Exam: ? ?ASIS Compression Test: Positive Right ?Cervical: TTP paraspinal, C3 RRSL SR ?Rib: Bilateral elevated first rib with TTP ?Thoracic: TTP paraspinal, T5 RRSR ?Lumbar: TTP paraspinal, L1-3 RRSL ?Pelvis: Right anterior innominate ? ?Electronically signed by:  ?Benito Mccreedy D.Merril Abbe ?[facility] ?3:52 PM 03/20/22 ?

## 2022-03-20 ENCOUNTER — Other Ambulatory Visit: Payer: Self-pay | Admitting: Internal Medicine

## 2022-03-20 ENCOUNTER — Ambulatory Visit: Payer: 59 | Admitting: Sports Medicine

## 2022-03-20 VITALS — BP 118/78 | HR 79 | Ht 75.0 in | Wt 204.0 lb

## 2022-03-20 DIAGNOSIS — M9901 Segmental and somatic dysfunction of cervical region: Secondary | ICD-10-CM

## 2022-03-20 DIAGNOSIS — M9903 Segmental and somatic dysfunction of lumbar region: Secondary | ICD-10-CM

## 2022-03-20 DIAGNOSIS — M9902 Segmental and somatic dysfunction of thoracic region: Secondary | ICD-10-CM | POA: Diagnosis not present

## 2022-03-20 DIAGNOSIS — M9905 Segmental and somatic dysfunction of pelvic region: Secondary | ICD-10-CM

## 2022-03-20 DIAGNOSIS — M9908 Segmental and somatic dysfunction of rib cage: Secondary | ICD-10-CM

## 2022-03-20 DIAGNOSIS — M5441 Lumbago with sciatica, right side: Secondary | ICD-10-CM

## 2022-03-20 DIAGNOSIS — M542 Cervicalgia: Secondary | ICD-10-CM

## 2022-03-20 DIAGNOSIS — G8929 Other chronic pain: Secondary | ICD-10-CM

## 2022-03-20 DIAGNOSIS — M1A09X0 Idiopathic chronic gout, multiple sites, without tophus (tophi): Secondary | ICD-10-CM

## 2022-03-20 MED ORDER — MELOXICAM 15 MG PO TABS: 15.0000 mg | ORAL_TABLET | Freq: Every day | ORAL | 0 refills | Status: DC

## 2022-03-20 NOTE — Patient Instructions (Addendum)
Good to see you  ?- Start meloxicam 15 mg daily x2 weeks.  If still having pain after 2 weeks, complete 3rd-week of meloxicam. May use remaining meloxicam as needed once daily for pain control.  Do not to use additional NSAIDs while taking meloxicam.  May use Tylenol [id number] mg 2 to 3 times a day for breakthrough pain. ?4 week follow up for repeat OMT ? ?

## 2022-03-21 LAB — PSA: PSA: 2.8

## 2022-04-06 ENCOUNTER — Encounter: Payer: Self-pay | Admitting: Internal Medicine

## 2022-04-06 ENCOUNTER — Ambulatory Visit: Payer: 59 | Admitting: Internal Medicine

## 2022-04-06 VITALS — BP 136/84 | HR 61 | Temp 97.8°F | Ht 75.0 in | Wt 205.0 lb

## 2022-04-06 DIAGNOSIS — D696 Thrombocytopenia, unspecified: Secondary | ICD-10-CM | POA: Diagnosis not present

## 2022-04-06 DIAGNOSIS — Z23 Encounter for immunization: Secondary | ICD-10-CM | POA: Diagnosis not present

## 2022-04-06 DIAGNOSIS — I10 Essential (primary) hypertension: Secondary | ICD-10-CM | POA: Diagnosis not present

## 2022-04-06 DIAGNOSIS — B353 Tinea pedis: Secondary | ICD-10-CM

## 2022-04-06 LAB — CBC WITH DIFFERENTIAL/PLATELET
Basophils Absolute: 0 10*3/uL (ref 0.0–0.1)
Basophils Relative: 0.4 % (ref 0.0–3.0)
Eosinophils Absolute: 0.1 10*3/uL (ref 0.0–0.7)
Eosinophils Relative: 1.6 % (ref 0.0–5.0)
HCT: 40.6 % (ref 39.0–52.0)
Hemoglobin: 13.9 g/dL (ref 13.0–17.0)
Lymphocytes Relative: 25.4 % (ref 12.0–46.0)
Lymphs Abs: 1.2 10*3/uL (ref 0.7–4.0)
MCHC: 34.3 g/dL (ref 30.0–36.0)
MCV: 97.9 fl (ref 78.0–100.0)
Monocytes Absolute: 0.4 10*3/uL (ref 0.1–1.0)
Monocytes Relative: 9.5 % (ref 3.0–12.0)
Neutro Abs: 3 10*3/uL (ref 1.4–7.7)
Neutrophils Relative %: 63.1 % (ref 43.0–77.0)
Platelets: 126 10*3/uL — ABNORMAL LOW (ref 150.0–400.0)
RBC: 4.14 Mil/uL — ABNORMAL LOW (ref 4.22–5.81)
RDW: 12.7 % (ref 11.5–15.5)
WBC: 4.7 10*3/uL (ref 4.0–10.5)

## 2022-04-06 MED ORDER — KETOCONAZOLE 2 % EX CREA: 1.0000 "application " | TOPICAL_CREAM | Freq: Two times a day (BID) | CUTANEOUS | 2 refills | Status: AC

## 2022-04-06 NOTE — Patient Instructions (Signed)
Hypertension, Adult High blood pressure (hypertension) is when the force of blood pumping through the arteries is too strong. The arteries are the blood vessels that carry blood from the heart throughout the body. Hypertension forces the heart to work harder to pump blood and may cause arteries to become narrow or stiff. Untreated or uncontrolled hypertension can lead to a heart attack, heart failure, a stroke, kidney disease, and other problems. A blood pressure reading consists of a higher number over a lower number. Ideally, your blood pressure should be below 120/80. The first ("top") number is called the systolic pressure. It is a measure of the pressure in your arteries as your heart beats. The second ("bottom") number is called the diastolic pressure. It is a measure of the pressure in your arteries as the heart relaxes. What are the causes? The exact cause of this condition is not known. There are some conditions that result in high blood pressure. What increases the risk? Certain factors may make you more likely to develop high blood pressure. Some of these risk factors are under your control, including: Smoking. Not getting enough exercise or physical activity. Being overweight. Having too much fat, sugar, calories, or salt (sodium) in your diet. Drinking too much alcohol. Other risk factors include: Having a personal history of heart disease, diabetes, high cholesterol, or kidney disease. Stress. Having a family history of high blood pressure and high cholesterol. Having obstructive sleep apnea. Age. The risk increases with age. What are the signs or symptoms? High blood pressure may not cause symptoms. Very high blood pressure (hypertensive crisis) may cause: Headache. Fast or irregular heartbeats (palpitations). Shortness of breath. Nosebleed. Nausea and vomiting. Vision changes. Severe chest pain, dizziness, and seizures. How is this diagnosed? This condition is diagnosed by  measuring your blood pressure while you are seated, with your arm resting on a flat surface, your legs uncrossed, and your feet flat on the floor. The cuff of the blood pressure monitor will be placed directly against the skin of your upper arm at the level of your heart. Blood pressure should be measured at least twice using the same arm. Certain conditions can cause a difference in blood pressure between your right and left arms. If you have a high blood pressure reading during one visit or you have normal blood pressure with other risk factors, you may be asked to: Return on a different day to have your blood pressure checked again. Monitor your blood pressure at home for 1 week or longer. If you are diagnosed with hypertension, you may have other blood or imaging tests to help your health care provider understand your overall risk for other conditions. How is this treated? This condition is treated by making healthy lifestyle changes, such as eating healthy foods, exercising more, and reducing your alcohol intake. You may be referred for counseling on a healthy diet and physical activity. Your health care provider may prescribe medicine if lifestyle changes are not enough to get your blood pressure under control and if: Your systolic blood pressure is above 130. Your diastolic blood pressure is above 80. Your personal target blood pressure may vary depending on your medical conditions, your age, and other factors. Follow these instructions at home: Eating and drinking  Eat a diet that is high in fiber and potassium, and low in sodium, added sugar, and fat. An example of this eating plan is called the DASH diet. DASH stands for Dietary Approaches to Stop Hypertension. To eat this way: Eat   plenty of fresh fruits and vegetables. Try to fill one half of your plate at each meal with fruits and vegetables. Eat whole grains, such as whole-wheat pasta, brown rice, or whole-grain bread. Fill about one  fourth of your plate with whole grains. Eat or drink low-fat dairy products, such as skim milk or low-fat yogurt. Avoid fatty cuts of meat, processed or cured meats, and poultry with skin. Fill about one fourth of your plate with lean proteins, such as fish, chicken without skin, beans, eggs, or tofu. Avoid pre-made and processed foods. These tend to be higher in sodium, added sugar, and fat. Reduce your daily sodium intake. Many people with hypertension should eat less than 1,500 mg of sodium a day. Do not drink alcohol if: Your health care provider tells you not to drink. You are pregnant, may be pregnant, or are planning to become pregnant. If you drink alcohol: Limit how much you have to: 0-1 drink a day for women. 0-2 drinks a day for men. Know how much alcohol is in your drink. In the [place] one drink equals one 12 oz bottle of beer (355 mL), one 5 oz glass of wine (148 mL), or one 1 oz glass of hard liquor (44 mL). Lifestyle  Work with your health care provider to maintain a healthy body weight or to lose weight. Ask what an ideal weight is for you. Get at least 30 minutes of exercise that causes your heart to beat faster (aerobic exercise) most days of the week. Activities may include walking, swimming, or biking. Include exercise to strengthen your muscles (resistance exercise), such as Pilates or lifting weights, as part of your weekly exercise routine. Try to do these types of exercises for 30 minutes at least 3 days a week. Do not use any products that contain nicotine or tobacco. These products include cigarettes, chewing tobacco, and vaping devices, such as e-cigarettes. If you need help quitting, ask your health care provider. Monitor your blood pressure at home as told by your health care provider. Keep all follow-up visits. This is important. Medicines Take over-the-counter and prescription medicines only as told by your health care provider. Follow directions carefully. Blood  pressure medicines must be taken as prescribed. Do not skip doses of blood pressure medicine. Doing this puts you at risk for problems and can make the medicine less effective. Ask your health care provider about side effects or reactions to medicines that you should watch for. Contact a health care provider if you: Think you are having a reaction to a medicine you are taking. Have headaches that keep coming back (recurring). Feel dizzy. Have swelling in your ankles. Have trouble with your vision. Get help right away if you: Develop a severe headache or confusion. Have unusual weakness or numbness. Feel faint. Have severe pain in your chest or abdomen. Vomit repeatedly. Have trouble breathing. These symptoms may be an emergency. Get help right away. Call 911. Do not wait to see if the symptoms will go away. Do not drive yourself to the hospital. Summary Hypertension is when the force of blood pumping through your arteries is too strong. If this condition is not controlled, it may put you at risk for serious complications. Your personal target blood pressure may vary depending on your medical conditions, your age, and other factors. For most people, a normal blood pressure is less than 120/80. Hypertension is treated with lifestyle changes, medicines, or a combination of both. Lifestyle changes include losing weight, eating a healthy,   low-sodium diet, exercising more, and limiting alcohol. This information is not intended to replace advice given to you by your health care provider. Make sure you discuss any questions you have with your health care provider. Document Revised: 09/06/2021 Document Reviewed: 09/06/2021 Elsevier Patient Education  2023 [employer]  

## 2022-04-06 NOTE — Progress Notes (Signed)
Subjective:  Patient ID: Brendan Short, male    DOB: February 17, 1961  Age: 61 y.o. MRN: [id number]  CC: Hypertension and Rash   HPI Brendan Short presents for f/up -  He complains of an intermittent rash on his feet and wants prescription for athlete's foot.  He is active and denies chest pain, shortness of breath, diaphoresis, edema, or fatigue.  Outpatient Medications Prior to Visit  Medication Sig Dispense Refill   azelastine (OPTIVAR) 0.05 % ophthalmic solution Place 1 drop into both eyes 2 (two) times daily. 6 mL 12   cholecalciferol (VITAMIN D) 1000 UNITS tablet Take 1,000 Units by mouth daily.     colchicine 0.6 MG tablet TAKE 1 TABLET BY MOUTH EVERY OTHER DAY 15 tablet 1   cyclobenzaprine (FLEXERIL) 5 MG tablet Take 1 tablet (5 mg total) by mouth at bedtime as needed for muscle spasms. 40 tablet 2   diclofenac (VOLTAREN) 0.1 % ophthalmic solution 4 (four) times daily. PRN     fluticasone (FLONASE) 50 MCG/ACT nasal spray Place 2 sprays into both nostrils daily. 48 g 1   glucosamine-chondroitin 500-400 MG tablet Take 1 tablet by mouth 3 (three) times daily.     levocetirizine (XYZAL) 5 MG tablet Take 1 tablet (5 mg total) by mouth every evening. 90 tablet 1   meloxicam (MOBIC) 15 MG tablet Take 1 tablet (15 mg total) by mouth daily. 30 tablet 0   probenecid (BENEMID) 500 MG tablet TAKE 1 TABLET BY MOUTH TWICE A DAY 60 tablet 2   Turmeric 500 MG TABS Take by mouth.     ketoconazole (NIZORAL) 2 % cream Apply 1 application topically 2 (two) times daily.     naproxen (NAPROSYN) 500 MG tablet Take 500 mg by mouth 2 (two) times daily.     No facility-administered medications prior to visit.    ROS Review of Systems  Constitutional: Negative.  Negative for chills, diaphoresis, fatigue and fever.  HENT: Negative.    Eyes: Negative.   Respiratory:  Negative for cough, chest tightness, shortness of breath and wheezing.   Cardiovascular:  Negative for chest pain, palpitations and leg  swelling.  Gastrointestinal:  Negative for abdominal pain, constipation, diarrhea, nausea and vomiting.  Endocrine: Negative.   Genitourinary: Negative.  Negative for difficulty urinating.  Musculoskeletal:  Positive for back pain. Negative for arthralgias and myalgias.  Skin: Negative.  Negative for color change, pallor and rash.  Neurological:  Negative for dizziness, weakness, light-headedness and headaches.  Hematological:  Negative for adenopathy. Does not bruise/bleed easily.  Psychiatric/Behavioral: Negative.     Objective:  BP 136/84 (BP Location: Left Arm, Patient Position: Sitting, Cuff Size: Large)   Pulse 61   Temp 97.8 F (36.6 C) (Oral)   Ht 6' 3"  (1.905 m)   Wt 205 lb (93 kg)   SpO2 96%   BMI 25.62 kg/m   BP Readings from Last 3 Encounters:  04/06/22 136/84  03/20/22 118/78  02/23/22 128/72    Wt Readings from Last 3 Encounters:  04/06/22 205 lb (93 kg)  03/20/22 204 lb (92.5 kg)  02/23/22 203 lb (92.1 kg)    Physical Exam Vitals reviewed.  HENT:     Nose: Nose normal.     Mouth/Throat:     Mouth: Mucous membranes are moist.  Eyes:     General: No scleral icterus.    Conjunctiva/sclera: Conjunctivae normal.  Cardiovascular:     Rate and Rhythm: Normal rate and regular rhythm.  Heart sounds: No murmur heard. Pulmonary:     Effort: Pulmonary effort is normal.     Breath sounds: No stridor. No wheezing, rhonchi or rales.  Abdominal:     General: Abdomen is flat.     Palpations: There is no mass.     Tenderness: There is no abdominal tenderness. There is no guarding.     Hernia: No hernia is present.  Musculoskeletal:        General: Normal range of motion.     Cervical back: Neck supple.     Right lower leg: No edema.     Left lower leg: No edema.  Lymphadenopathy:     Cervical: No cervical adenopathy.  Skin:    General: Skin is warm and dry.  Neurological:     General: No focal deficit present.     Mental Status: He is alert.   Psychiatric:        Mood and Affect: Mood normal.        Behavior: Behavior normal.    Lab Results  Component Value Date   WBC 4.7 04/06/2022   HGB 13.9 04/06/2022   HCT 40.6 04/06/2022   PLT 126.0 (L) 04/06/2022   GLUCOSE 101 (H) 01/04/2022   CHOL 192 01/04/2022   TRIG 181.0 (H) 01/04/2022   HDL 42.00 01/04/2022   LDLCALC 114 (H) 01/04/2022   ALT 20 10/22/2020   AST 20 10/22/2020   NA 139 01/04/2022   K 4.1 01/04/2022   CL 102 01/04/2022   CREATININE 0.99 01/04/2022   BUN 19 01/04/2022   CO2 32 01/04/2022   TSH 2.96 01/04/2022   PSA 2.8 03/21/2022   HGBA1C 5.6 10/22/2020    CT ANGIO CHEST AORTA W/CM & OR WO/CM  Result Date: 01/24/2022 CLINICAL DATA:  Aortic aneurysm, known or suspected. Family history of aortic aneurysm. On equal upper extremity blood pressures. EXAM: CT ANGIOGRAPHY CHEST WITH CONTRAST TECHNIQUE: Multidetector CT imaging of the chest was performed using the standard protocol during bolus administration of intravenous contrast. Multiplanar CT image reconstructions and MIPs were obtained to evaluate the vascular anatomy. RADIATION DOSE REDUCTION: This exam was performed according to the departmental dose-optimization program which includes automated exposure control, adjustment of the mA and/or kV according to patient size and/or use of iterative reconstruction technique. CONTRAST:  139m OMNIPAQUE IOHEXOL 350 MG/ML SOLN COMPARISON:  None. FINDINGS: Cardiovascular: The aortic root, ascending, transverse and descending thoracic aorta are all normal in caliber. No evidence of dissection or significant atherosclerotic plaque. A linear filling defect in the proximal left common carotid artery lies in a region of streak artifact and is favored to be artifactual. There is no evidence of flow limitation. No propagation along the more distal common carotid artery. Main pulmonary artery is normal in size. No PE. The heart is normal in size. No pericardial effusion.  Mediastinum/Nodes: Unremarkable CT appearance of the thyroid gland. No suspicious mediastinal or hilar adenopathy. No soft tissue mediastinal mass. The thoracic esophagus is unremarkable. Lungs/Pleura: Lungs are clear. No pleural effusion or pneumothorax. Upper Abdomen: No acute abnormality. Musculoskeletal: No acute fracture or aggressive appearing lytic or blastic osseous lesion. Review of the MIP images confirms the above findings. IMPRESSION: 1. No evidence of aortic aneurysm. 2. Linear defect in the proximal most aspect of the left common carotid artery in a region of streak artifact. Finding is favored to be artifactual in nature. If real, this would represent a short segment chronic dissection flap without evidence of distal propagation or  flow limitation and would be essentially clinically insignificant. 3. No acute cardiopulmonary process identified. Electronically Signed   By: Jacqulynn Cadet M.D.   On: 01/24/2022 12:42    Assessment & Plan:   Alfie was seen today for hypertension and rash.  Diagnoses and all orders for this visit:  Primary hypertension- His blood pressure is adequately well controlled. -     CBC with Differential/Platelet; Future -     CBC with Differential/Platelet  Thrombocytopenia ([place])- His platelet count is stable.  There is no history of bleeding or bruising.  Will continue to follow this. -     CBC with Differential/Platelet; Future -     CBC with Differential/Platelet  Tinea pedis, recurrent -     ketoconazole (NIZORAL) 2 % cream; Apply 1 application. topically 2 (two) times daily.  Other orders -     Cancel: PSA, total and free; Future -     Varicella-zoster vaccine IM (Shingrix)   I have discontinued Braylon W. Galligan's naproxen. I have also changed his ketoconazole. Additionally, I am having him maintain his cholecalciferol, diclofenac, Turmeric, glucosamine-chondroitin, azelastine, cyclobenzaprine, levocetirizine, fluticasone, probenecid, colchicine, and  meloxicam.  Meds ordered this encounter  Medications   ketoconazole (NIZORAL) 2 % cream    Sig: Apply 1 application. topically 2 (two) times daily.    Dispense:  60 g    Refill:  2     Follow-up: Return in about 6 months (around 10/07/2022).  Scarlette Calico, MD

## 2022-04-13 ENCOUNTER — Other Ambulatory Visit: Payer: Self-pay | Admitting: Internal Medicine

## 2022-04-13 DIAGNOSIS — M1A09X0 Idiopathic chronic gout, multiple sites, without tophus (tophi): Secondary | ICD-10-CM

## 2022-04-17 NOTE — Progress Notes (Unsigned)
Brendan Short D.[place] Phone: [phone]   Assessment and Plan:     There are no diagnoses linked to this encounter.  *** - Patient has received significant relief with OMT in the past.  Elects for repeat OMT today.  Tolerated well per note below. - Decision today to treat with OMT was based on Physical Exam   After verbal consent patient was treated with HVLA (high velocity low amplitude), ME (muscle energy), FPR (flex positional release), ST (soft tissue), PC/PD (Pelvic Compression/ Pelvic Decompression) techniques in cervical, rib, thoracic, lumbar, and pelvic areas. Patient tolerated the procedure well with improvement in symptoms.  Patient educated on potential side effects of soreness and recommended to rest, hydrate, and use Tylenol as needed for pain control.   Pertinent previous records reviewed include ***   Follow Up: ***     Subjective:   I, Brendan Short, am serving as a Education administrator for Doctor Glennon Mac   Chief Complaint: SI joint pain    HPI:  09/20/21 Patient is a 61 year old male presenting with low back pain that has been going on for the past 2 years with 2 flares in the past month. Patient saw his PCP 09/16/21 and 08/02/21 for this reason and has since been referred to sports medicine for evaluation and treatment. Patient locates pain to both the left and right side of the lower back and has been trying to be careful when lifting things at work. Patient is a Development worker, international aid.    Radiates: yes down the right upper leg Numbness/tingling:no Weakness:no Aggravates: bending and reaching. Or twisting  Treatments tried:prednisone, flexeril, back exercises, solanpas, ice, tens unit    10/18/21 Patient states that exercises are helping a lot able to do more bending and twisting stretching in the morning and at night sees a big difference    01/05/2022 Patient states that he is not feeling well but he has  been feeling really good he is now stiff needs a tune up     01/26/2022 Patient states that he's been doing good got a little tightness in his neck , needs a tune up    02/23/2022 Patient states that he has been good , but now he has some butt pain that goes down his leg when he gets in the truck it gets really tight that happens every time around this year he starts doing more weed eating stuff, cant get comfortable when he sits in the truck for a long time     03/20/2022 Patient states that he has been working real hard so its been kind of rough has been practicing with his golf game , last week he had a few night that he had to take flexeril and use his solnpas patch he is planning on retiring soon , some neck tightness on his left side    04/18/2022 Patient states   Relevant Historical Information: 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  Current Outpatient Medications  Medication Sig Dispense Refill   azelastine (OPTIVAR) 0.05 % ophthalmic solution Place 1 drop into both eyes 2 (two) times daily. 6 mL 12   cholecalciferol (VITAMIN D) 1000 UNITS tablet Take 1,000 Units by mouth daily.     colchicine 0.6 MG tablet TAKE 1 TABLET BY MOUTH EVERY OTHER DAY 15 tablet 1   cyclobenzaprine (FLEXERIL) 5 MG tablet Take 1 tablet (5 mg total)  by mouth at bedtime as needed for muscle spasms. 40 tablet 2   diclofenac (VOLTAREN) 0.1 % ophthalmic solution 4 (four) times daily. PRN     fluticasone (FLONASE) 50 MCG/ACT nasal spray Place 2 sprays into both nostrils daily. 48 g 1   glucosamine-chondroitin 500-400 MG tablet Take 1 tablet by mouth 3 (three) times daily.     ketoconazole (NIZORAL) 2 % cream Apply 1 application. topically 2 (two) times daily. 60 g 2   levocetirizine (XYZAL) 5 MG tablet Take 1 tablet (5 mg total) by mouth every evening. 90 tablet 1   meloxicam (MOBIC) 15 MG tablet Take 1 tablet (15 mg total) by mouth daily. 30  tablet 0   probenecid (BENEMID) 500 MG tablet TAKE 1 TABLET BY MOUTH TWICE A DAY 60 tablet 2   Turmeric 500 MG TABS Take by mouth.     No current facility-administered medications for this visit.      Objective:     There were no vitals filed for this visit.    There is no height or weight on file to calculate BMI.    Physical Exam:     General: Well-appearing, cooperative, sitting comfortably in no acute distress.   OMT Physical Exam:  ASIS Compression Test: Positive Right Cervical: TTP paraspinal, *** Rib: Bilateral elevated first rib with TTP Thoracic: TTP paraspinal,*** Lumbar: TTP paraspinal,*** Pelvis: Right anterior innominate  Electronically signed by:  Brendan Short D.[facility] 1:11 PM 04/17/22

## 2022-04-18 ENCOUNTER — Ambulatory Visit: Payer: 59 | Admitting: Sports Medicine

## 2022-04-18 VITALS — BP 126/78 | HR 82 | Ht 75.0 in | Wt 202.0 lb

## 2022-04-18 DIAGNOSIS — M9908 Segmental and somatic dysfunction of rib cage: Secondary | ICD-10-CM

## 2022-04-18 DIAGNOSIS — G8929 Other chronic pain: Secondary | ICD-10-CM

## 2022-04-18 DIAGNOSIS — M542 Cervicalgia: Secondary | ICD-10-CM | POA: Diagnosis not present

## 2022-04-18 DIAGNOSIS — M9901 Segmental and somatic dysfunction of cervical region: Secondary | ICD-10-CM | POA: Diagnosis not present

## 2022-04-18 DIAGNOSIS — M9903 Segmental and somatic dysfunction of lumbar region: Secondary | ICD-10-CM

## 2022-04-18 DIAGNOSIS — M5441 Lumbago with sciatica, right side: Secondary | ICD-10-CM

## 2022-04-18 DIAGNOSIS — M9902 Segmental and somatic dysfunction of thoracic region: Secondary | ICD-10-CM | POA: Diagnosis not present

## 2022-04-18 DIAGNOSIS — M9905 Segmental and somatic dysfunction of pelvic region: Secondary | ICD-10-CM

## 2022-04-18 MED ORDER — MELOXICAM 15 MG PO TABS
15.0000 mg | ORAL_TABLET | Freq: Every day | ORAL | 0 refills | Status: DC
Start: 2022-04-18 — End: 2023-03-07

## 2022-04-18 NOTE — Patient Instructions (Addendum)
Good to see you  Meloxicam refill as needed  4 week follow up

## 2022-05-06 ENCOUNTER — Other Ambulatory Visit: Payer: Self-pay | Admitting: Internal Medicine

## 2022-05-06 DIAGNOSIS — M1A09X0 Idiopathic chronic gout, multiple sites, without tophus (tophi): Secondary | ICD-10-CM

## 2022-05-10 NOTE — Progress Notes (Deleted)
Brendan Short Brendan Short Phone: [phone]   Assessment and Plan:     There are no diagnoses linked to this encounter.  *** - Patient has received significant relief with OMT in the past.  Elects for repeat OMT today.  Tolerated well per note below. - Decision today to treat with OMT was based on Physical Exam   After verbal consent patient was treated with HVLA (high velocity low amplitude), ME (muscle energy), FPR (flex positional release), ST (soft tissue), PC/PD (Pelvic Compression/ Pelvic Decompression) techniques in cervical, rib, thoracic, lumbar, and pelvic areas. Patient tolerated the procedure well with improvement in symptoms.  Patient educated on potential side effects of soreness and recommended to rest, hydrate, and use Tylenol as needed for pain control.   Pertinent previous records reviewed include ***   Follow Up: ***     Subjective:   I, Brendan Short, am serving as a Education administrator for Brendan Short   Chief Complaint: SI joint pain    HPI:  09/20/21 Patient is a 61 year old male presenting with low back pain that has been going on for the past 2 years with 2 flares in the past month. Patient saw his PCP 09/16/21 and 08/02/21 for this reason and has since been referred to sports medicine for evaluation and treatment. Patient locates pain to both the left and right side of the lower back and has been trying to be careful when lifting things at work. Patient is a Development worker, international aid.    Radiates: yes down the right upper leg Numbness/tingling:no Weakness:no Aggravates: bending and reaching. Or twisting  Treatments tried:prednisone, flexeril, back exercises, solanpas, ice, tens unit    10/18/21 Patient states that exercises are helping a lot able to do more bending and twisting stretching in the morning and at night sees a big difference    01/05/2022 Patient states that he is not feeling well but he has  been feeling really good he is now stiff needs a tune up     01/26/2022 Patient states that he's been doing good got a little tightness in his neck , needs a tune up    02/23/2022 Patient states that he has been good , but now he has some butt pain that goes down his leg when he gets in the truck it gets really tight that happens every time around this year he starts doing more weed eating stuff, cant get comfortable when he sits in the truck for a long time     03/20/2022 Patient states that he has been working real hard so its been kind of rough has been practicing with his golf game , last week he had a few night that he had to take flexeril and use his solnpas patch he is planning on retiring soon , some neck tightness on his left side    04/18/2022 Patient states tweaked his low back playing golf was able to finish but can feel it a little , upper back and neck are alright , 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  Current Outpatient Medications  Medication Sig Dispense Refill   azelastine (OPTIVAR) 0.05 % ophthalmic solution Place 1 drop into both eyes 2 (two) times daily. 6 mL 12   cholecalciferol (VITAMIN D)  1000 UNITS tablet Take 1,000 Units by mouth daily.     colchicine 0.6 MG tablet TAKE 1 TABLET BY MOUTH EVERY OTHER DAY 15 tablet 1   cyclobenzaprine (FLEXERIL) 5 MG tablet Take 1 tablet (5 mg total) by mouth at bedtime as needed for muscle spasms. 40 tablet 2   diclofenac (VOLTAREN) 0.1 % ophthalmic solution 4 (four) times daily. PRN     fluticasone (FLONASE) 50 MCG/ACT nasal spray Place 2 sprays into both nostrils daily. 48 g 1   glucosamine-chondroitin 500-400 MG tablet Take 1 tablet by mouth 3 (three) times daily.     ketoconazole (NIZORAL) 2 % cream Apply 1 application. topically 2  (two) times daily. 60 g 2   levocetirizine (XYZAL) 5 MG tablet Take 1 tablet (5 mg total) by mouth every evening. 90 tablet 1   meloxicam (MOBIC) 15 MG tablet Take 1 tablet (15 mg total) by mouth daily. 30 tablet 0   probenecid (BENEMID) 500 MG tablet TAKE 1 TABLET BY MOUTH TWICE A DAY 60 tablet 2   Turmeric 500 MG TABS Take by mouth.     No current facility-administered medications for this visit.      Objective:     There were no vitals filed for this visit.    There is no height or weight on file to calculate BMI.    Physical Exam:     General: Well-appearing, cooperative, sitting comfortably in no acute distress.   OMT Physical Exam:  ASIS Compression Test: Positive Right Cervical: TTP paraspinal, *** Rib: Bilateral elevated first rib with TTP Thoracic: TTP paraspinal,*** Lumbar: TTP paraspinal,*** Pelvis: Right anterior innominate  Electronically signed by:  Brendan Short [facility] 2:22 PM 05/10/22

## 2022-05-17 ENCOUNTER — Ambulatory Visit: Payer: 59 | Admitting: Sports Medicine

## 2022-05-17 ENCOUNTER — Other Ambulatory Visit: Payer: Self-pay | Admitting: Internal Medicine

## 2022-05-17 DIAGNOSIS — M109 Gout, unspecified: Secondary | ICD-10-CM

## 2022-05-17 DIAGNOSIS — M1A09X0 Idiopathic chronic gout, multiple sites, without tophus (tophi): Secondary | ICD-10-CM

## 2022-05-18 ENCOUNTER — Other Ambulatory Visit: Payer: Self-pay | Admitting: Sports Medicine

## 2022-05-19 NOTE — Progress Notes (Unsigned)
Benito Mccreedy D.[place] Phone: [phone]   Assessment and Plan:     There are no diagnoses linked to this encounter.  *** - Patient has received significant relief with OMT in the past.  Elects for repeat OMT today.  Tolerated well per note below. - Decision today to treat with OMT was based on Physical Exam   After verbal consent patient was treated with HVLA (high velocity low amplitude), ME (muscle energy), FPR (flex positional release), ST (soft tissue), PC/PD (Pelvic Compression/ Pelvic Decompression) techniques in cervical, rib, thoracic, lumbar, and pelvic areas. Patient tolerated the procedure well with improvement in symptoms.  Patient educated on potential side effects of soreness and recommended to rest, hydrate, and use Tylenol as needed for pain control.   Pertinent previous records reviewed include ***   Follow Up: ***     Subjective:   I, Aubriella Perezgarcia, am serving as a Education administrator for Doctor Glennon Mac   Chief Complaint: SI joint pain    HPI:  09/20/21 Patient is a 61 year old male presenting with low back pain that has been going on for the past 2 years with 2 flares in the past month. Patient saw his PCP 09/16/21 and 08/02/21 for this reason and has since been referred to sports medicine for evaluation and treatment. Patient locates pain to both the left and right side of the lower back and has been trying to be careful when lifting things at work. Patient is a Development worker, international aid.    Radiates: yes down the right upper leg Numbness/tingling:no Weakness:no Aggravates: bending and reaching. Or twisting  Treatments tried:prednisone, flexeril, back exercises, solanpas, ice, tens unit    10/18/21 Patient states that exercises are helping a lot able to do more bending and twisting stretching in the morning and at night sees a big difference    01/05/2022 Patient states that he is not feeling well but he has  been feeling really good he is now stiff needs a tune up     01/26/2022 Patient states that he's been doing good got a little tightness in his neck , needs a tune up    02/23/2022 Patient states that he has been good , but now he has some butt pain that goes down his leg when he gets in the truck it gets really tight that happens every time around this year he starts doing more weed eating stuff, cant get comfortable when he sits in the truck for a long time     03/20/2022 Patient states that he has been working real hard so its been kind of rough has been practicing with his golf game , last week he had a few night that he had to take flexeril and use his solnpas patch he is planning on retiring soon , some neck tightness on his left side    04/18/2022 Patient states tweaked his low back playing golf was able to finish but can feel it a little , upper back and neck are alright , 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  Current Outpatient Medications  Medication Sig Dispense Refill   azelastine (OPTIVAR) 0.05 % ophthalmic solution Place 1 drop into both eyes 2 (two) times daily. 6 mL 12   cholecalciferol (VITAMIN D) 1000 UNITS  tablet Take 1,000 Units by mouth daily.     colchicine 0.6 MG tablet TAKE 1 TABLET BY MOUTH EVERY OTHER DAY 15 tablet 1   cyclobenzaprine (FLEXERIL) 5 MG tablet Take 1 tablet (5 mg total) by mouth at bedtime as needed for muscle spasms. 40 tablet 2   diclofenac (VOLTAREN) 0.1 % ophthalmic solution 4 (four) times daily. PRN     fluticasone (FLONASE) 50 MCG/ACT nasal spray Place 2 sprays into both nostrils daily. 48 g 1   glucosamine-chondroitin 500-400 MG tablet Take 1 tablet by mouth 3 (three) times daily.     ketoconazole (NIZORAL) 2 % cream Apply 1 application. topically 2 (two)  times daily. 60 g 2   levocetirizine (XYZAL) 5 MG tablet Take 1 tablet (5 mg total) by mouth every evening. 90 tablet 1   meloxicam (MOBIC) 15 MG tablet Take 1 tablet (15 mg total) by mouth daily. 30 tablet 0   probenecid (BENEMID) 500 MG tablet TAKE 1 TABLET BY MOUTH TWICE A DAY 60 tablet 2   Turmeric 500 MG TABS Take by mouth.     No current facility-administered medications for this visit.      Objective:     There were no vitals filed for this visit.    There is no height or weight on file to calculate BMI.    Physical Exam:     General: Well-appearing, cooperative, sitting comfortably in no acute distress.   OMT Physical Exam:  ASIS Compression Test: Positive Right Cervical: TTP paraspinal, *** Rib: Bilateral elevated first rib with TTP Thoracic: TTP paraspinal,*** Lumbar: TTP paraspinal,*** Pelvis: Right anterior innominate  Electronically signed by:  Benito Mccreedy D.[facility] 3:51 PM 05/19/22

## 2022-05-22 ENCOUNTER — Ambulatory Visit: Payer: 59 | Admitting: Sports Medicine

## 2022-05-22 VITALS — BP 132/80 | HR 69 | Ht 75.0 in | Wt 205.0 lb

## 2022-05-22 DIAGNOSIS — G8929 Other chronic pain: Secondary | ICD-10-CM

## 2022-05-22 DIAGNOSIS — M9903 Segmental and somatic dysfunction of lumbar region: Secondary | ICD-10-CM | POA: Diagnosis not present

## 2022-05-22 DIAGNOSIS — M9905 Segmental and somatic dysfunction of pelvic region: Secondary | ICD-10-CM

## 2022-05-22 DIAGNOSIS — M5441 Lumbago with sciatica, right side: Secondary | ICD-10-CM | POA: Diagnosis not present

## 2022-05-22 DIAGNOSIS — M9902 Segmental and somatic dysfunction of thoracic region: Secondary | ICD-10-CM

## 2022-05-22 NOTE — Patient Instructions (Signed)
Good to see you

## 2022-05-30 ENCOUNTER — Other Ambulatory Visit: Payer: Self-pay | Admitting: Internal Medicine

## 2022-05-30 DIAGNOSIS — M1A09X0 Idiopathic chronic gout, multiple sites, without tophus (tophi): Secondary | ICD-10-CM

## 2022-06-22 ENCOUNTER — Other Ambulatory Visit: Payer: Self-pay | Admitting: Internal Medicine

## 2022-06-22 DIAGNOSIS — M1A09X0 Idiopathic chronic gout, multiple sites, without tophus (tophi): Secondary | ICD-10-CM

## 2022-06-28 NOTE — Progress Notes (Signed)
Brendan Short D.[facility] [place] Phone: [phone]   Assessment and Plan:     1. Chronic bilateral low back pain with right-sided sciatica 2. Somatic dysfunction of thoracic region 3. Somatic dysfunction of lumbar region 4. Somatic dysfunction of pelvic region  -Chronic with exacerbation, subsequent visit - Recurrence of multiple musculoskeletal complaints with most prominent being in right-sided thoracolumbar junction.  Overall low back pain has been significantly improved with regular OMT treatments and patient requesting additional OMT today - May use Tylenol/NSAIDs as needed for pain control - Recommend stretching before and after golf - Patient has received significant relief with OMT in the past.  Elects for repeat OMT today.  Tolerated well per note below. - Decision today to treat with OMT was based on Physical Exam   After verbal consent patient was treated with HVLA (high velocity low amplitude), ME (muscle energy), FPR (flex positional release), ST (soft tissue), PC/PD (Pelvic Compression/ Pelvic Decompression) techniques in thoracic, lumbar, and pelvic areas. Patient tolerated the procedure well with improvement in symptoms.  Patient educated on potential side effects of soreness and recommended to rest, hydrate, and use Tylenol as needed for pain control.   Pertinent previous records reviewed include none   Follow Up: 6 weeks for reevaluation and repeat OMT if needed   Subjective:   , Brendan Short, am serving as a Education administrator for Brendan Short   Chief Complaint: SI joint pain    HPI:  09/20/21 Patient is a 61 year old male presenting with low back pain that has been going on for the past 2 years with 2 flares in the past month. Patient saw his PCP 09/16/21 and 08/02/21 for this reason and has since been referred to sports medicine for evaluation and treatment. Patient locates pain to both the left and right  side of the lower back and has been trying to be careful when lifting things at work. Patient is a Development worker, international aid.    Radiates: yes down the right upper leg Numbness/tingling:no Weakness:no Aggravates: bending and reaching. Or twisting  Treatments tried:prednisone, flexeril, back exercises, solanpas, ice, tens unit    10/18/21 Patient states that exercises are helping a lot able to do more bending and twisting stretching in the morning and at night sees a big difference    01/05/2022 Patient states that he is not feeling well but he has been feeling really good he is now stiff needs a tune up     01/26/2022 Patient states that he's been doing good got a little tightness in his neck , needs a tune up    02/23/2022 Patient states that he has been good , but now he has some butt pain that goes down his leg when he gets in the truck it gets really tight that happens every time around this year he starts doing more weed eating stuff, cant get comfortable when he sits in the truck for a long time     03/20/2022 Patient states that he has been working real hard so its been kind of rough has been practicing with his golf game , last week he had a few night that he had to take flexeril and use his solnpas patch he is planning on retiring soon , some neck tightness on his left side    04/18/2022 Patient states tweaked his low back playing golf was able to finish but can feel it a little , upper back and neck  are alright , 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  Current Outpatient Medications   Medication Sig Dispense Refill   azelastine (OPTIVAR) 0.05 % ophthalmic solution Place 1 drop into both eyes 2 (two) times daily. 6 mL 12   cholecalciferol (VITAMIN D) 1000 UNITS tablet Take 1,000 Units by mouth daily.     colchicine 0.6 MG tablet TAKE 1 TABLET BY MOUTH EVERY OTHER DAY 15 tablet 1   cyclobenzaprine (FLEXERIL) 5 MG tablet Take 1 tablet (5 mg total) by mouth at bedtime as needed for muscle spasms. 40 tablet 2   diclofenac (VOLTAREN) 0.1 % ophthalmic solution 4 (four) times daily. PRN     fluticasone (FLONASE) 50 MCG/ACT nasal spray Place 2 sprays into both nostrils daily. 48 g 1   glucosamine-chondroitin 500-400 MG tablet Take 1 tablet by mouth 3 (three) times daily.     ketoconazole (NIZORAL) 2 % cream Apply 1 application. topically 2 (two) times daily. 60 g 2   levocetirizine (XYZAL) 5 MG tablet Take 1 tablet (5 mg total) by mouth every evening. 90 tablet 1   meloxicam (MOBIC) 15 MG tablet Take 1 tablet (15 mg total) by mouth daily. 30 tablet 0   probenecid (BENEMID) 500 MG tablet TAKE 1 TABLET BY MOUTH TWICE A DAY 60 tablet 2   Turmeric 500 MG TABS Take by mouth.     No current facility-administered medications for this visit.      Objective:     Vitals:   07/03/22 1521  BP: 122/82  Pulse: 62  SpO2: 97%  Weight: 206 lb (93.4 kg)  Height: 6' 3"  (1.905 m)      Body mass index is 25.75 kg/m.    Physical Exam:     General: Well-appearing, cooperative, sitting comfortably in no acute distress.   OMT Physical Exam:  ASIS Compression Test: Positive Right   Thoracic: TTP paraspinal, T5-9 RLSR Lumbar: TTP paraspinal, L1-3 RRSL, L5 RL Pelvis: Right anterior innominate  Electronically signed by:  Brendan Short D.[facility] 3:53 PM 07/03/22

## 2022-07-03 ENCOUNTER — Ambulatory Visit: Payer: 59 | Admitting: Sports Medicine

## 2022-07-03 VITALS — BP 122/82 | HR 62 | Ht 75.0 in | Wt 206.0 lb

## 2022-07-03 DIAGNOSIS — G8929 Other chronic pain: Secondary | ICD-10-CM

## 2022-07-03 DIAGNOSIS — M9905 Segmental and somatic dysfunction of pelvic region: Secondary | ICD-10-CM | POA: Diagnosis not present

## 2022-07-03 DIAGNOSIS — M9902 Segmental and somatic dysfunction of thoracic region: Secondary | ICD-10-CM

## 2022-07-03 DIAGNOSIS — M5441 Lumbago with sciatica, right side: Secondary | ICD-10-CM | POA: Diagnosis not present

## 2022-07-03 DIAGNOSIS — M9903 Segmental and somatic dysfunction of lumbar region: Secondary | ICD-10-CM | POA: Diagnosis not present

## 2022-07-03 NOTE — Patient Instructions (Signed)
Good to see you

## 2022-07-18 ENCOUNTER — Other Ambulatory Visit: Payer: Self-pay | Admitting: Internal Medicine

## 2022-07-18 DIAGNOSIS — M1A09X0 Idiopathic chronic gout, multiple sites, without tophus (tophi): Secondary | ICD-10-CM

## 2022-08-10 NOTE — Progress Notes (Signed)
Brendan Short D.[facility] [place] Phone: [phone]   Assessment and Plan:     1. Chronic bilateral low back pain with right-sided sciatica 2. Somatic dysfunction of thoracic region 3. Somatic dysfunction of lumbar region 4. Somatic dysfunction of pelvic region  -Chronic with exacerbation, subsequent sports medicine visit - Recurrence of multiple musculoskeletal complaints with most prominent being in left lower back with recent flare after [place] - Continue HEP - Patient has received significant relief with OMT in the past.  Elects for repeat OMT today.  Tolerated well per note below. - Decision today to treat with OMT was based on Physical Exam   After verbal consent patient was treated with HVLA (high velocity low amplitude), ME (muscle energy), FPR (flex positional release), ST (soft tissue), PC/PD (Pelvic Compression/ Pelvic Decompression) techniques in thoracic, lumbar, and pelvic areas. Patient tolerated the procedure well with improvement in symptoms.  Patient educated on potential side effects of soreness and recommended to rest, hydrate, and use Tylenol as needed for pain control.   Pertinent previous records reviewed include none   Follow Up: 4 to 6 weeks for reevaluation.  Could consider repeat OMT   Subjective:   I, Brendan Short, am serving as a Education administrator for Doctor Glennon Mac   Chief Complaint: SI joint pain    HPI:  09/20/21 Patient is a 61 year old male presenting with low back pain that has been going on for the past 2 years with 2 flares in the past month. Patient saw his PCP 09/16/21 and 08/02/21 for this reason and has since been referred to sports medicine for evaluation and treatment. Patient locates pain to both the left and right side of the lower back and has been trying to be careful when lifting things at work. Patient is a Development worker, international aid.     Radiates: yes down the right upper leg Numbness/tingling:no Weakness:no Aggravates: bending and reaching. Or twisting  Treatments tried:prednisone, flexeril, back exercises, solanpas, ice, tens unit    10/18/21 Patient states that exercises are helping a lot able to do more bending and twisting stretching in the morning and at night sees a big difference    01/05/2022 Patient states that he is not feeling well but he has been feeling really good he is now stiff needs a tune up     01/26/2022 Patient states that he's been doing good got a little tightness in his neck , needs a tune up    02/23/2022 Patient states that he has been good , but now he has some butt pain that goes down his leg when he gets in the truck it gets really tight that happens every time around this year he starts doing more weed eating stuff, cant get comfortable when he sits in the truck for a long time     03/20/2022 Patient states that he has been working real hard so its been kind of rough has been practicing with his golf game , last week he had a few night that he had to take flexeril and use his solnpas patch he is planning on retiring soon , some neck tightness on his left side    04/18/2022 Patient states tweaked his low back playing golf was able to finish but can feel it a little , upper back and neck are alright , 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  Current Outpatient Medications  Medication Sig Dispense Refill   azelastine (OPTIVAR) 0.05 % ophthalmic solution Place 1 drop into both eyes 2 (two) times daily. 6 mL 12   cholecalciferol (VITAMIN D) 1000 UNITS tablet Take 1,000 Units by mouth daily.     colchicine 0.6 MG tablet TAKE 1 TABLET BY MOUTH EVERY OTHER DAY 15 tablet 1   cyclobenzaprine (FLEXERIL) 5 MG tablet Take 1 tablet (5 mg total) by mouth at bedtime as needed for muscle spasms. 40 tablet 2   diclofenac (VOLTAREN) 0.1 % ophthalmic solution 4 (four) times daily. PRN     fluticasone (FLONASE) 50 MCG/ACT nasal spray Place 2 sprays into both nostrils daily. 48 g 1   glucosamine-chondroitin 500-400 MG tablet Take 1 tablet by mouth 3 (three) times daily.     ketoconazole (NIZORAL) 2 % cream Apply 1 application. topically 2 (two) times daily. 60 g 2   levocetirizine (XYZAL) 5 MG tablet Take 1 tablet (5 mg total) by mouth every evening. 90 tablet 1   meloxicam (MOBIC) 15 MG tablet Take 1 tablet (15 mg total) by mouth daily. 30 tablet 0   probenecid (BENEMID) 500 MG tablet TAKE 1 TABLET BY MOUTH TWICE A DAY 60 tablet 2   Turmeric 500 MG TABS Take by mouth.     No current facility-administered medications for this visit.      Objective:     Vitals:   08/14/22 1521  BP: 120/80  Pulse: 65  SpO2: 98%  Weight: 207 lb (93.9 kg)  Height: 6' 3"  (1.905 m)      Body mass index is 25.87 kg/m.    Physical Exam:     General: Well-appearing, cooperative, sitting comfortably in no acute distress.   OMT Physical Exam:  ASIS Compression Test: Positive left   Thoracic: TTP paraspinal, T7-10 RRSL Lumbar: TTP paraspinal, limited rotation throughout lumbar spine Pelvis: Left anterior innominate  Electronically signed by:  Brendan Short D.[facility] 3:42 PM 08/14/22

## 2022-08-12 ENCOUNTER — Other Ambulatory Visit: Payer: Self-pay | Admitting: Internal Medicine

## 2022-08-12 DIAGNOSIS — M109 Gout, unspecified: Secondary | ICD-10-CM

## 2022-08-12 DIAGNOSIS — M1A09X0 Idiopathic chronic gout, multiple sites, without tophus (tophi): Secondary | ICD-10-CM

## 2022-08-14 ENCOUNTER — Ambulatory Visit: Payer: 59 | Admitting: Sports Medicine

## 2022-08-14 VITALS — BP 120/80 | HR 65 | Ht 75.0 in | Wt 207.0 lb

## 2022-08-14 DIAGNOSIS — M5441 Lumbago with sciatica, right side: Secondary | ICD-10-CM | POA: Diagnosis not present

## 2022-08-14 DIAGNOSIS — M9903 Segmental and somatic dysfunction of lumbar region: Secondary | ICD-10-CM | POA: Diagnosis not present

## 2022-08-14 DIAGNOSIS — M9902 Segmental and somatic dysfunction of thoracic region: Secondary | ICD-10-CM | POA: Diagnosis not present

## 2022-08-14 DIAGNOSIS — M9905 Segmental and somatic dysfunction of pelvic region: Secondary | ICD-10-CM | POA: Diagnosis not present

## 2022-08-14 DIAGNOSIS — G8929 Other chronic pain: Secondary | ICD-10-CM

## 2022-08-14 NOTE — Patient Instructions (Signed)
Good to see you

## 2022-09-04 ENCOUNTER — Ambulatory Visit: Payer: 59 | Admitting: Sports Medicine

## 2022-09-04 ENCOUNTER — Ambulatory Visit: Payer: Self-pay

## 2022-09-04 VITALS — BP 118/80 | HR 61 | Ht 75.0 in | Wt 207.0 lb

## 2022-09-04 DIAGNOSIS — G8929 Other chronic pain: Secondary | ICD-10-CM

## 2022-09-04 DIAGNOSIS — M533 Sacrococcygeal disorders, not elsewhere classified: Secondary | ICD-10-CM | POA: Diagnosis not present

## 2022-09-04 NOTE — Patient Instructions (Addendum)
Good to see you  3 week follow up [facility]

## 2022-09-04 NOTE — Progress Notes (Signed)
Benito Mccreedy D.[place] Phone: [phone]   Assessment and Plan:     1. Chronic left SI joint pain -Chronic with exacerbation, subsequent sports medicine visit - Recurrence of multiple musculoskeletal complaints with most prominent being in left SI joint - Patient has received moderate relief in similar pain flares with CSI to left SI joint in the past.  He elects for repeat injection today.  Tolerated well per note below - Continue HEP and stretching routine - [place] LIMITED JOINT SPACE STRUCTURES LOW BILAT(NO LINKED CHARGES)    Procedure: Ultrasound Guided Sacroiliac Joint Injection  Side: Left Diagnosis: Left SI joint pain [place] Indication:  - accuracy is paramount for diagnosis - to ensure therapeutic efficacy or procedural success - to reduce procedural risk  After explaining the procedure, viable alternatives, risks, and answering any questions, consent was given verbally. The site was cleaned with chlorhexidine prep. An ultrasound transducer was placed on the lumbosacral spine.  The spinous processes were identified. These were followed down from the lumbar spine to the scarum and the SI joint was identified as were the neural foramen.  A needle was introduced with care taken to avoid the neural foramen under ultrasound guidance into the SI joint with sterile technique.    A steroid injection was performed using 43m of 1% lidocaine without epinephrine and 40 mg of triamcinolone (KENALOG) 439mml. This was well tolerated and resulted in  relief.  Needle was removed and dressing placed and post injection instructions were given including  a discussion of likely return of pain today after the anesthetic wears off (with the possibility of worsened pain) until the steroid starts to work in 1-3 days.   Pt was advised to call or return to clinic if these symptoms worsen or fail to improve as anticipated.   Pertinent previous records  reviewed include none   Follow Up: 3 to 4 weeks for reevaluation.  Could consider repeat OMT   Subjective:   I, Moenique Parris, am serving as a scEducation administratoror Doctor BeGlennon Mac Chief Complaint: SI joint pain    HPI:  09/20/21 Patient is a [age]ear old male presenting with low back pain that has been going on for the past 2 years with 2 flares in the past month. Patient saw his PCP 09/16/21 and 08/02/21 for this reason and has since been referred to sports medicine for evaluation and treatment. Patient locates pain to both the left and right side of the lower back and has been trying to be careful when lifting things at work. Patient is a laDevelopment worker, international aid   Radiates: yes down the right upper leg Numbness/tingling:no Weakness:no Aggravates: bending and reaching. Or twisting  Treatments tried:prednisone, flexeril, back exercises, solanpas, ice, tens unit    10/18/21 Patient states that exercises are helping a lot able to do more bending and twisting stretching in the morning and at night sees a big difference    01/05/2022 Patient states that he is not feeling well but he has been feeling really good he is now stiff needs a tune up     01/26/2022 Patient states that he's been doing good got a little tightness in his neck , needs a tune up    02/23/2022 Patient states that he has been good , but now he has some butt pain that goes down his leg when he gets in the truck it gets really tight that happens every time  around this year he starts doing more weed eating stuff, cant get comfortable when he sits in the truck for a long time     03/20/2022 Patient states that he has been working real hard so its been kind of rough has been practicing with his golf game , last week he had a few night that he had to take flexeril and use his solnpas patch he is planning on retiring soon , some neck tightness on his left side    04/18/2022 Patient states tweaked his low back playing golf was able to finish  but can feel it a little , upper back and neck are alright , 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    05/22/2022 Patient states fells pretty good just needs a tune up    07/03/2022 Patient states that he is pretty good, right side thoracic he is a little out of whack but slowly getting better than it was 2 weeks ago, low back is doing good    08/14/2022 Patient states went to a wedding this past weekend and then he went to play golf it is time for a tune up , low back is really really flared and locked up left side is sticking the SI joint  09/04/2022 Patient states he was playing golf yesterday and while he was stretching he felt a "boom" and he has been taking flexeril and meloxicam lower back is locked up, cant get comfortable  he thinks he might need a csi or extra work thinks it  could be cause of the weather change but he is really jacked up     Relevant Historical Information: 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  Current Outpatient Medications  Medication Sig Dispense Refill   azelastine (OPTIVAR) 0.05 % ophthalmic solution Place 1 drop into both eyes 2 (two) times daily. 6 mL 12   cholecalciferol (VITAMIN D) 1000 UNITS tablet Take 1,000 Units by mouth daily.     colchicine 0.6 MG tablet TAKE 1 TABLET BY MOUTH EVERY OTHER DAY 15 tablet 1   cyclobenzaprine (FLEXERIL) 5 MG tablet Take 1 tablet (5 mg total) by mouth at bedtime as needed for muscle spasms. 40 tablet 2   diclofenac (VOLTAREN) 0.1 % ophthalmic solution 4 (four) times daily. PRN     fluticasone (FLONASE) 50 MCG/ACT nasal spray Place 2 sprays into both nostrils daily. 48 g 1   glucosamine-chondroitin 500-400 MG tablet Take 1 tablet by mouth 3 (three) times daily.     ketoconazole (NIZORAL) 2 % cream Apply 1 application. topically 2 (two) times daily. 60 g 2   levocetirizine (XYZAL) 5 MG tablet Take 1 tablet (5 mg total) by mouth  every evening. 90 tablet 1   meloxicam (MOBIC) 15 MG tablet Take 1 tablet (15 mg total) by mouth daily. 30 tablet 0   probenecid (BENEMID) 500 MG tablet TAKE 1 TABLET BY MOUTH TWICE A DAY 60 tablet 2   Turmeric 500 MG TABS Take by mouth.     No current facility-administered medications for this visit.      Objective:     Vitals:   09/04/22 1408  BP: 118/80  Pulse: 61  SpO2: 99%  Weight: 207 lb (93.9 kg)  Height: 6' 3"  (1.905 m)      Body mass index is 25.87 kg/m.    Physical Exam:    Gen: Appears well, nad, nontoxic and pleasant Psych: Alert  and oriented, appropriate mood and affect Neuro: sensation intact, strength is 5/5 in upper and lower extremities, muscle tone wnl Skin: no susupicious lesions or rashes  Back - Normal skin, Spine with normal alignment and no deformity.   No tenderness to vertebral process palpation.   Right paraspinous muscles are not tender and without spasm TTP left SI, left lumbar paraspinals NTTP gluteal musculature Straight leg raise negative Trendelenberg negative Piriformis Test negative  Electronically signed by:  Benito Mccreedy D.[facility] 2:34 PM 09/04/22

## 2022-09-05 ENCOUNTER — Other Ambulatory Visit: Payer: Self-pay | Admitting: Internal Medicine

## 2022-09-05 DIAGNOSIS — M1A09X0 Idiopathic chronic gout, multiple sites, without tophus (tophi): Secondary | ICD-10-CM

## 2022-09-08 LAB — PSA: PSA: 2.7

## 2022-09-22 NOTE — Progress Notes (Unsigned)
Benito Mccreedy D.[place] Phone: [phone]   Assessment and Plan:     There are no diagnoses linked to this encounter.  ***   Pertinent previous records reviewed include ***   Follow Up: ***     Subjective:   I, Alcee Sipos, am serving as a Education administrator for Doctor Glennon Mac   Chief Complaint: SI joint pain    HPI:  09/20/21 Patient is a 61 year old male presenting with low back pain that has been going on for the past 2 years with 2 flares in the past month. Patient saw his PCP 09/16/21 and 08/02/21 for this reason and has since been referred to sports medicine for evaluation and treatment. Patient locates pain to both the left and right side of the lower back and has been trying to be careful when lifting things at work. Patient is a Development worker, international aid.    Radiates: yes down the right upper leg Numbness/tingling:no Weakness:no Aggravates: bending and reaching. Or twisting  Treatments tried:prednisone, flexeril, back exercises, solanpas, ice, tens unit    10/18/21 Patient states that exercises are helping a lot able to do more bending and twisting stretching in the morning and at night sees a big difference    01/05/2022 Patient states that he is not feeling well but he has been feeling really good he is now stiff needs a tune up     01/26/2022 Patient states that he's been doing good got a little tightness in his neck , needs a tune up    02/23/2022 Patient states that he has been good , but now he has some butt pain that goes down his leg when he gets in the truck it gets really tight that happens every time around this year he starts doing more weed eating stuff, cant get comfortable when he sits in the truck for a long time     03/20/2022 Patient states that he has been working real hard so its been kind of rough has been practicing with his golf game , last week he had a few night that he had to take  flexeril and use his solnpas patch he is planning on retiring soon , some neck tightness on his left side    04/18/2022 Patient states tweaked his low back playing golf was able to finish but can feel it a little , upper back and neck are alright , 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    05/22/2022 Patient states fells pretty good just needs a tune up    07/03/2022 Patient states that he is pretty good, right side thoracic he is a little out of whack but slowly getting better than it was 2 weeks ago, low back is doing good    08/14/2022 Patient states went to a wedding this past weekend and then he went to play golf it is time for a tune up , low back is really really flared and locked up left side is sticking the SI joint   09/04/2022 Patient states he was playing golf yesterday and while he was stretching he felt a "boom" and he has been taking flexeril and meloxicam lower back is locked up, cant get comfortable  he thinks he might need a csi or extra work thinks it  could be cause of the weather change but he is really jacked up     11 /13/2023 Patient states  Relevant Historical  Information: 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   Current Outpatient Medications:    azelastine (OPTIVAR) 0.05 % ophthalmic solution, Place 1 drop into both eyes 2 (two) times daily., Disp: 6 mL, Rfl: 12   cholecalciferol (VITAMIN D) 1000 UNITS tablet, Take 1,000 Units by mouth daily., Disp: , Rfl:    colchicine 0.6 MG tablet, TAKE 1 TABLET BY MOUTH EVERY OTHER DAY, Disp: 45 tablet, Rfl: 0   cyclobenzaprine (FLEXERIL) 5 MG tablet, Take 1 tablet (5 mg total) by mouth at bedtime as needed for muscle spasms., Disp: 40 tablet, Rfl: 2   diclofenac (VOLTAREN) 0.1 % ophthalmic solution, 4 (four) times daily. PRN, Disp: , Rfl:    fluticasone (FLONASE) 50 MCG/ACT nasal spray, Place 2 sprays into both nostrils daily., Disp: 48  g, Rfl: 1   glucosamine-chondroitin 500-400 MG tablet, Take 1 tablet by mouth 3 (three) times daily., Disp: , Rfl:    ketoconazole (NIZORAL) 2 % cream, Apply 1 application. topically 2 (two) times daily., Disp: 60 g, Rfl: 2   levocetirizine (XYZAL) 5 MG tablet, Take 1 tablet (5 mg total) by mouth every evening., Disp: 90 tablet, Rfl: 1   meloxicam (MOBIC) 15 MG tablet, Take 1 tablet (15 mg total) by mouth daily., Disp: 30 tablet, Rfl: 0   probenecid (BENEMID) 500 MG tablet, TAKE 1 TABLET BY MOUTH TWICE A DAY, Disp: 60 tablet, Rfl: 2   Turmeric 500 MG TABS, Take by mouth., Disp: , Rfl:    Objective:     There were no vitals filed for this visit.    There is no height or weight on file to calculate BMI.    Physical Exam:    ***   Electronically signed by:  Benito Mccreedy D.[facility] 11:48 AM 09/22/22

## 2022-09-25 ENCOUNTER — Ambulatory Visit: Payer: 59 | Admitting: Sports Medicine

## 2022-09-25 VITALS — BP 108/80 | HR 60 | Ht 75.0 in | Wt 204.0 lb

## 2022-09-25 DIAGNOSIS — M5441 Lumbago with sciatica, right side: Secondary | ICD-10-CM

## 2022-09-25 DIAGNOSIS — G8929 Other chronic pain: Secondary | ICD-10-CM

## 2022-09-25 DIAGNOSIS — M5126 Other intervertebral disc displacement, lumbar region: Secondary | ICD-10-CM

## 2022-09-25 DIAGNOSIS — M533 Sacrococcygeal disorders, not elsewhere classified: Secondary | ICD-10-CM | POA: Diagnosis not present

## 2022-09-25 NOTE — Patient Instructions (Addendum)
Good to see you  Epidural right L4-L5 Follow up 2 weeks after to discuss results

## 2022-10-07 ENCOUNTER — Other Ambulatory Visit: Payer: Self-pay | Admitting: Internal Medicine

## 2022-10-07 DIAGNOSIS — D510 Vitamin B12 deficiency anemia due to intrinsic factor deficiency: Secondary | ICD-10-CM

## 2022-10-10 ENCOUNTER — Ambulatory Visit
Admission: RE | Admit: 2022-10-10 | Discharge: 2022-10-10 | Disposition: A | Discharge status: Home / Self Care | Payer: 59 | Source: Ambulatory Visit | Attending: Sports Medicine | Admitting: Sports Medicine

## 2022-10-10 DIAGNOSIS — G8929 Other chronic pain: Secondary | ICD-10-CM

## 2022-10-10 DIAGNOSIS — M5126 Other intervertebral disc displacement, lumbar region: Secondary | ICD-10-CM

## 2022-10-10 MED ORDER — METHYLPREDNISOLONE ACETATE 40 MG/ML INJ SUSP (RADIOLOG
80.0000 mg | Freq: Once | INTRAMUSCULAR | Status: AC
  Administered 2022-10-10: 80 mg via EPIDURAL

## 2022-10-10 MED ORDER — IOPAMIDOL (ISOVUE-M 200) INJECTION 41%
1.0000 mL | Freq: Once | INTRAMUSCULAR | Status: AC
  Administered 2022-10-10: 1 mL via EPIDURAL

## 2022-10-10 NOTE — Discharge Instructions (Signed)

## 2022-10-11 ENCOUNTER — Encounter: Payer: Self-pay | Admitting: Internal Medicine

## 2022-10-11 ENCOUNTER — Ambulatory Visit: Payer: 59 | Admitting: Internal Medicine

## 2022-10-11 VITALS — BP 142/86 | HR 58 | Temp 98.1°F | Resp 16 | Ht 75.0 in | Wt 205.0 lb

## 2022-10-11 DIAGNOSIS — I10 Essential (primary) hypertension: Secondary | ICD-10-CM | POA: Diagnosis not present

## 2022-10-11 DIAGNOSIS — D696 Thrombocytopenia, unspecified: Secondary | ICD-10-CM

## 2022-10-11 DIAGNOSIS — M1A09X0 Idiopathic chronic gout, multiple sites, without tophus (tophi): Secondary | ICD-10-CM | POA: Diagnosis not present

## 2022-10-11 DIAGNOSIS — D510 Vitamin B12 deficiency anemia due to intrinsic factor deficiency: Secondary | ICD-10-CM | POA: Diagnosis not present

## 2022-10-11 LAB — CBC WITH DIFFERENTIAL/PLATELET
Basophils Absolute: 0 10*3/uL (ref 0.0–0.1)
Basophils Relative: 0.2 % (ref 0.0–3.0)
Eosinophils Absolute: 0 10*3/uL (ref 0.0–0.7)
Eosinophils Relative: 0.2 % (ref 0.0–5.0)
HCT: 42.6 % (ref 39.0–52.0)
Hemoglobin: 14.8 g/dL (ref 13.0–17.0)
Lymphocytes Relative: 9.6 % — ABNORMAL LOW (ref 12.0–46.0)
Lymphs Abs: 0.9 10*3/uL (ref 0.7–4.0)
MCHC: 34.7 g/dL (ref 30.0–36.0)
MCV: 99 fl (ref 78.0–100.0)
Monocytes Absolute: 0.7 10*3/uL (ref 0.1–1.0)
Monocytes Relative: 6.7 % (ref 3.0–12.0)
Neutro Abs: 8.2 10*3/uL — ABNORMAL HIGH (ref 1.4–7.7)
Neutrophils Relative %: 83.3 % — ABNORMAL HIGH (ref 43.0–77.0)
Platelets: 157 10*3/uL (ref 150.0–400.0)
RBC: 4.3 Mil/uL (ref 4.22–5.81)
RDW: 12.2 % (ref 11.5–15.5)
WBC: 9.8 10*3/uL (ref 4.0–10.5)

## 2022-10-11 LAB — BASIC METABOLIC PANEL
BUN: 15 mg/dL (ref 6–23)
CO2: 30 mEq/L (ref 19–32)
Calcium: 9.6 mg/dL (ref 8.4–10.5)
Chloride: 102 mEq/L (ref 96–112)
Creatinine, Ser: 0.94 mg/dL (ref 0.40–1.50)
GFR: 86.85 mL/min (ref >60.00)
Glucose, Bld: 129 mg/dL — ABNORMAL HIGH (ref 70–99)
Potassium: 4.3 mEq/L (ref 3.5–5.1)
Sodium: 140 mEq/L (ref 135–145)

## 2022-10-11 LAB — VITAMIN B12: Vitamin B-12: 704 pg/mL (ref 211–911)

## 2022-10-11 LAB — FOLATE: Folate: 14.8 ng/mL (ref >5.9)

## 2022-10-11 LAB — URIC ACID: Uric Acid, Serum: 4.2 mg/dL (ref 4.0–7.8)

## 2022-10-11 NOTE — Patient Instructions (Signed)
Hypertension, Adult High blood pressure (hypertension) is when the force of blood pumping through the arteries is too strong. The arteries are the blood vessels that carry blood from the heart throughout the body. Hypertension forces the heart to work harder to pump blood and may cause arteries to become narrow or stiff. Untreated or uncontrolled hypertension can lead to a heart attack, heart failure, a stroke, kidney disease, and other problems. A blood pressure reading consists of a higher number over a lower number. Ideally, your blood pressure should be below 120/80. The first ("top") number is called the systolic pressure. It is a measure of the pressure in your arteries as your heart beats. The second ("bottom") number is called the diastolic pressure. It is a measure of the pressure in your arteries as the heart relaxes. What are the causes? The exact cause of this condition is not known. There are some conditions that result in high blood pressure. What increases the risk? Certain factors may make you more likely to develop high blood pressure. Some of these risk factors are under your control, including: Smoking. Not getting enough exercise or physical activity. Being overweight. Having too much fat, sugar, calories, or salt (sodium) in your diet. Drinking too much alcohol. Other risk factors include: Having a personal history of heart disease, diabetes, high cholesterol, or kidney disease. Stress. Having a family history of high blood pressure and high cholesterol. Having obstructive sleep apnea. Age. The risk increases with age. What are the signs or symptoms? High blood pressure may not cause symptoms. Very high blood pressure (hypertensive crisis) may cause: Headache. Fast or irregular heartbeats (palpitations). Shortness of breath. Nosebleed. Nausea and vomiting. Vision changes. Severe chest pain, dizziness, and seizures. How is this diagnosed? This condition is diagnosed by  measuring your blood pressure while you are seated, with your arm resting on a flat surface, your legs uncrossed, and your feet flat on the floor. The cuff of the blood pressure monitor will be placed directly against the skin of your upper arm at the level of your heart. Blood pressure should be measured at least twice using the same arm. Certain conditions can cause a difference in blood pressure between your right and left arms. If you have a high blood pressure reading during one visit or you have normal blood pressure with other risk factors, you may be asked to: Return on a different day to have your blood pressure checked again. Monitor your blood pressure at home for 1 week or longer. If you are diagnosed with hypertension, you may have other blood or imaging tests to help your health care provider understand your overall risk for other conditions. How is this treated? This condition is treated by making healthy lifestyle changes, such as eating healthy foods, exercising more, and reducing your alcohol intake. You may be referred for counseling on a healthy diet and physical activity. Your health care provider may prescribe medicine if lifestyle changes are not enough to get your blood pressure under control and if: Your systolic blood pressure is above 130. Your diastolic blood pressure is above 80. Your personal target blood pressure may vary depending on your medical conditions, your age, and other factors. Follow these instructions at home: Eating and drinking  Eat a diet that is high in fiber and potassium, and low in sodium, added sugar, and fat. An example of this eating plan is called the DASH diet. DASH stands for Dietary Approaches to Stop Hypertension. To eat this way: Eat   plenty of fresh fruits and vegetables. Try to fill one half of your plate at each meal with fruits and vegetables. Eat whole grains, such as whole-wheat pasta, brown rice, or whole-grain bread. Fill about one  fourth of your plate with whole grains. Eat or drink low-fat dairy products, such as skim milk or low-fat yogurt. Avoid fatty cuts of meat, processed or cured meats, and poultry with skin. Fill about one fourth of your plate with lean proteins, such as fish, chicken without skin, beans, eggs, or tofu. Avoid pre-made and processed foods. These tend to be higher in sodium, added sugar, and fat. Reduce your daily sodium intake. Many people with hypertension should eat less than 1,500 mg of sodium a day. Do not drink alcohol if: Your health care provider tells you not to drink. You are pregnant, may be pregnant, or are planning to become pregnant. If you drink alcohol: Limit how much you have to: 0-1 drink a day for women. 0-2 drinks a day for men. Know how much alcohol is in your drink. In the [place] one drink equals one 12 oz bottle of beer (355 mL), one 5 oz glass of wine (148 mL), or one 1 oz glass of hard liquor (44 mL). Lifestyle  Work with your health care provider to maintain a healthy body weight or to lose weight. Ask what an ideal weight is for you. Get at least 30 minutes of exercise that causes your heart to beat faster (aerobic exercise) most days of the week. Activities may include walking, swimming, or biking. Include exercise to strengthen your muscles (resistance exercise), such as Pilates or lifting weights, as part of your weekly exercise routine. Try to do these types of exercises for 30 minutes at least 3 days a week. Do not use any products that contain nicotine or tobacco. These products include cigarettes, chewing tobacco, and vaping devices, such as e-cigarettes. If you need help quitting, ask your health care provider. Monitor your blood pressure at home as told by your health care provider. Keep all follow-up visits. This is important. Medicines Take over-the-counter and prescription medicines only as told by your health care provider. Follow directions carefully. Blood  pressure medicines must be taken as prescribed. Do not skip doses of blood pressure medicine. Doing this puts you at risk for problems and can make the medicine less effective. Ask your health care provider about side effects or reactions to medicines that you should watch for. Contact a health care provider if you: Think you are having a reaction to a medicine you are taking. Have headaches that keep coming back (recurring). Feel dizzy. Have swelling in your ankles. Have trouble with your vision. Get help right away if you: Develop a severe headache or confusion. Have unusual weakness or numbness. Feel faint. Have severe pain in your chest or abdomen. Vomit repeatedly. Have trouble breathing. These symptoms may be an emergency. Get help right away. Call 911. Do not wait to see if the symptoms will go away. Do not drive yourself to the hospital. Summary Hypertension is when the force of blood pumping through your arteries is too strong. If this condition is not controlled, it may put you at risk for serious complications. Your personal target blood pressure may vary depending on your medical conditions, your age, and other factors. For most people, a normal blood pressure is less than 120/80. Hypertension is treated with lifestyle changes, medicines, or a combination of both. Lifestyle changes include losing weight, eating a healthy,   low-sodium diet, exercising more, and limiting alcohol. This information is not intended to replace advice given to you by your health care provider. Make sure you discuss any questions you have with your health care provider. Document Revised: 09/06/2021 Document Reviewed: 09/06/2021 Elsevier Patient Education  2023 [employer]  

## 2022-10-11 NOTE — Progress Notes (Signed)
Subjective:  Patient ID: Brendan Short, male    DOB: [birth year]  Age: 61 y.o. MRN: [id number]  CC: Hypertension   HPI Brendan Short presents for f/up -  He plays golf and is very active.  His endurance is good and he denies chest pain, shortness of breath, diaphoresis, or edema.  Outpatient Medications Prior to Visit  Medication Sig Dispense Refill   azelastine (OPTIVAR) 0.05 % ophthalmic solution Place 1 drop into both eyes 2 (two) times daily. 6 mL 12   cholecalciferol (VITAMIN D) 1000 UNITS tablet Take 1,000 Units by mouth daily.     colchicine 0.6 MG tablet TAKE 1 TABLET BY MOUTH EVERY OTHER DAY 45 tablet 0   cyclobenzaprine (FLEXERIL) 5 MG tablet Take 1 tablet (5 mg total) by mouth at bedtime as needed for muscle spasms. 40 tablet 2   diclofenac (VOLTAREN) 0.1 % ophthalmic solution 4 (four) times daily. PRN     fluticasone (FLONASE) 50 MCG/ACT nasal spray Place 2 sprays into both nostrils daily. 48 g 1   glucosamine-chondroitin 500-400 MG tablet Take 1 tablet by mouth 3 (three) times daily.     ketoconazole (NIZORAL) 2 % cream Apply 1 application. topically 2 (two) times daily. 60 g 2   levocetirizine (XYZAL) 5 MG tablet Take 1 tablet (5 mg total) by mouth every evening. 90 tablet 1   meloxicam (MOBIC) 15 MG tablet Take 1 tablet (15 mg total) by mouth daily. 30 tablet 0   probenecid (BENEMID) 500 MG tablet TAKE 1 TABLET BY MOUTH TWICE A DAY 60 tablet 2   Turmeric 500 MG TABS Take by mouth.     No facility-administered medications prior to visit.    ROS Review of Systems  Constitutional: Negative.  Negative for diaphoresis and fatigue.  HENT: Negative.    Eyes: Negative.   Respiratory:  Negative for cough, chest tightness, shortness of breath and wheezing.   Cardiovascular:  Negative for chest pain, palpitations and leg swelling.  Gastrointestinal:  Negative for abdominal pain, diarrhea, nausea and vomiting.  Endocrine: Negative.   Genitourinary: Negative.  Negative for  difficulty urinating and hematuria.  Musculoskeletal:  Negative for arthralgias and joint swelling.  Skin: Negative.   Neurological:  Negative for dizziness and weakness.  Hematological:  Negative for adenopathy. Does not bruise/bleed easily.  Psychiatric/Behavioral: Negative.      Objective:  BP (!) 142/86 (BP Location: Left Arm, Patient Position: Sitting, Cuff Size: Large)   Pulse (!) 58   Temp 98.1 F (36.7 C) (Oral)   Resp 16   Ht 6' 3"  (1.905 m)   Wt 205 lb (93 kg)   SpO2 98%   BMI 25.62 kg/m   BP Readings from Last 3 Encounters:  10/11/22 (!) 142/86  10/10/22 (!) 141/87  09/25/22 108/80    Wt Readings from Last 3 Encounters:  10/11/22 205 lb (93 kg)  09/25/22 204 lb (92.5 kg)  09/04/22 207 lb (93.9 kg)    Physical Exam Vitals reviewed.  HENT:     Mouth/Throat:     Mouth: Mucous membranes are moist.  Eyes:     General: No scleral icterus.    Conjunctiva/sclera: Conjunctivae normal.  Cardiovascular:     Rate and Rhythm: Normal rate and regular rhythm.     Heart sounds: No murmur heard. Pulmonary:     Effort: Pulmonary effort is normal.     Breath sounds: No stridor. No wheezing, rhonchi or rales.  Abdominal:     General: Abdomen  is flat.     Palpations: There is no mass.     Tenderness: There is no abdominal tenderness. There is no guarding.     Hernia: No hernia is present.  Musculoskeletal:        General: Normal range of motion.     Cervical back: Neck supple.     Right lower leg: No edema.     Left lower leg: No edema.  Lymphadenopathy:     Cervical: No cervical adenopathy.  Skin:    General: Skin is warm and dry.  Neurological:     General: No focal deficit present.     Mental Status: He is alert.  Psychiatric:        Mood and Affect: Mood normal.        Behavior: Behavior normal.     Lab Results  Component Value Date   WBC 9.8 10/11/2022   HGB 14.8 10/11/2022   HCT 42.6 10/11/2022   PLT 157.0 10/11/2022   GLUCOSE 129 (H) 10/11/2022    CHOL 192 01/04/2022   TRIG 181.0 (H) 01/04/2022   HDL 42.00 01/04/2022   LDLCALC 114 (H) 01/04/2022   ALT 20 10/22/2020   AST 20 10/22/2020   NA 140 10/11/2022   K 4.3 10/11/2022   CL 102 10/11/2022   CREATININE 0.94 10/11/2022   BUN 15 10/11/2022   CO2 30 10/11/2022   TSH 2.96 01/04/2022   PSA 2.7 09/08/2022   HGBA1C 5.6 10/22/2020    DG INJECT DIAG/THERA/INC NEEDLE/CATH/PLC EPI/LUMB/SAC W/IMG  Result Date: 10/10/2022 CLINICAL DATA:  Lumbosacral spondylosis without myelopathy. Bilateral low back pain. Right-sided disc herniation at L4-5. FLUOROSCOPY: Radiation Exposure Index (as provided by the fluoroscopic device): 1.10 mGy Kerma PROCEDURE: The procedure, risks, benefits, and alternatives were explained to the patient. Questions regarding the procedure were encouraged and answered. The patient understands and consents to the procedure. LUMBAR EPIDURAL INJECTION: An interlaminar approach was performed on the right at L4-5. The overlying skin was cleansed and anesthetized. A 3.5 inch 20 gauge epidural needle was advanced using loss-of-resistance technique. DIAGNOSTIC EPIDURAL INJECTION: Injection of Isovue-M 200 shows a good epidural pattern with spread above and below the level of needle placement, primarily on the right. No vascular opacification is seen. THERAPEUTIC EPIDURAL INJECTION: 80 mg of Depo-Medrol mixed with 3 mL of 1% lidocaine were instilled. The procedure was well-tolerated, and the patient was discharged thirty minutes following the injection in good condition. COMPLICATIONS: None immediate IMPRESSION: Technically successful interlaminar epidural injection on the right at L4-5. Electronically Signed   By: Logan Bores M.D.   On: 10/10/2022 12:34    Assessment & Plan:   [name] was seen today for hypertension.  Diagnoses and all orders for this visit:  Primary hypertension- His blood pressure is not at goal.  He will continue working on his lifestyle modifications. -      Basic metabolic panel; Future -     Basic metabolic panel  Thrombocytopenia (HCC)-platelets are normal now. -     CBC with Differential/Platelet; Future -     Folate; Future -     Vitamin B12; Future -     Vitamin B12 -     Folate -     CBC with Differential/Platelet  Idiopathic chronic gout of multiple sites without tophus-he has achieved his uric acid goal. -     Uric acid; Future -     Basic metabolic panel; Future -     Basic metabolic panel -  Uric acid  Vitamin B12 deficiency anemia due to intrinsic factor deficiency-H&H, B12, and folate are normal.  Will continue parenteral B12 replacement therapy. -     CBC with Differential/Platelet; Future -     Folate; Future -     Vitamin B12; Future -     Vitamin B12 -     Folate -     CBC with Differential/Platelet   I am having Tahmid W. Suit maintain his cholecalciferol, diclofenac, Turmeric, glucosamine-chondroitin, azelastine, cyclobenzaprine, levocetirizine, fluticasone, ketoconazole, meloxicam, probenecid, and colchicine.  No orders of the defined types were placed in this encounter.    Follow-up: Return in about 4 months (around 02/09/2023).  Scarlette Calico, MD

## 2022-10-23 NOTE — Progress Notes (Unsigned)
Benito Mccreedy D.[place] Phone: [phone]   Assessment and Plan:     There are no diagnoses linked to this encounter.  *** - Patient has received significant relief with OMT in the past.  Elects for repeat OMT today.  Tolerated well per note below. - Decision today to treat with OMT was based on Physical Exam   After verbal consent patient was treated with HVLA (high velocity low amplitude), ME (muscle energy), FPR (flex positional release), ST (soft tissue), PC/PD (Pelvic Compression/ Pelvic Decompression) techniques in cervical, rib, thoracic, lumbar, and pelvic areas. Patient tolerated the procedure well with improvement in symptoms.  Patient educated on potential side effects of soreness and recommended to rest, hydrate, and use Tylenol as needed for pain control.   Pertinent previous records reviewed include ***   Follow Up: ***     Subjective:   I, Brendan Short, am serving as a Education administrator for Doctor Glennon Mac   Chief Complaint: SI joint pain    HPI:  09/20/21 Patient is a 61 year old male presenting with low back pain that has been going on for the past 2 years with 2 flares in the past month. Patient saw his PCP 09/16/21 and 08/02/21 for this reason and has since been referred to sports medicine for evaluation and treatment. Patient locates pain to both the left and right side of the lower back and has been trying to be careful when lifting things at work. Patient is a Development worker, international aid.    Radiates: yes down the right upper leg Numbness/tingling:no Weakness:no Aggravates: bending and reaching. Or twisting  Treatments tried:prednisone, flexeril, back exercises, solanpas, ice, tens unit    10/18/21 Patient states that exercises are helping a lot able to do more bending and twisting stretching in the morning and at night sees a big difference    01/05/2022 Patient states that he is not feeling well but he has  been feeling really good he is now stiff needs a tune up     01/26/2022 Patient states that he's been doing good got a little tightness in his neck , needs a tune up    02/23/2022 Patient states that he has been good , but now he has some butt pain that goes down his leg when he gets in the truck it gets really tight that happens every time around this year he starts doing more weed eating stuff, cant get comfortable when he sits in the truck for a long time     03/20/2022 Patient states that he has been working real hard so its been kind of rough has been practicing with his golf game , last week he had a few night that he had to take flexeril and use his solnpas patch he is planning on retiring soon , some neck tightness on his left side    04/18/2022 Patient states tweaked his low back playing golf was able to finish but can feel it a little , upper back and neck are alright , 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    05/22/2022 Patient states fells pretty good just needs a tune up    07/03/2022 Patient states that he is pretty good, right side thoracic he is a little out of whack but slowly getting better than it was 2 weeks ago, low back is doing good    08/14/2022 Patient states went to a wedding this past weekend  and then he went to play golf it is time for a tune up , low back is really really flared and locked up left side is sticking the SI joint   09/04/2022 Patient states he was playing golf yesterday and while he was stretching he felt a "boom" and he has been taking flexeril and meloxicam lower back is locked up, cant get comfortable  he thinks he might need a csi or extra work thinks it  could be cause of the weather change but he is really jacked up     09/25/2022 Patient states that he is okay , csi helped a lot but still tender in the middle ,   10/24/2022 Patient states    Relevant Historical Information: 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  Current Outpatient Medications  Medication Sig Dispense Refill   azelastine (OPTIVAR) 0.05 % ophthalmic solution Place 1 drop into both eyes 2 (two) times daily. 6 mL 12   cholecalciferol (VITAMIN D) 1000 UNITS tablet Take 1,000 Units by mouth daily.     colchicine 0.6 MG tablet TAKE 1 TABLET BY MOUTH EVERY OTHER DAY 45 tablet 0   cyclobenzaprine (FLEXERIL) 5 MG tablet Take 1 tablet (5 mg total) by mouth at bedtime as needed for muscle spasms. 40 tablet 2   diclofenac (VOLTAREN) 0.1 % ophthalmic solution 4 (four) times daily. PRN     fluticasone (FLONASE) 50 MCG/ACT nasal spray Place 2 sprays into both nostrils daily. 48 g 1   glucosamine-chondroitin 500-400 MG tablet Take 1 tablet by mouth 3 (three) times daily.     ketoconazole (NIZORAL) 2 % cream Apply 1 application. topically 2 (two) times daily. 60 g 2   levocetirizine (XYZAL) 5 MG tablet Take 1 tablet (5 mg total) by mouth every evening. 90 tablet 1   meloxicam (MOBIC) 15 MG tablet Take 1 tablet (15 mg total) by mouth daily. 30 tablet 0   probenecid (BENEMID) 500 MG tablet TAKE 1 TABLET BY MOUTH TWICE A DAY 60 tablet 2   Turmeric 500 MG TABS Take by mouth.     No current facility-administered medications for this visit.      Objective:     There were no vitals filed for this visit.    There is no height or weight on file to calculate BMI.    Physical Exam:     General: Well-appearing, cooperative, sitting comfortably in no acute distress.   OMT Physical Exam:  ASIS Compression Test: Positive Right Cervical: TTP paraspinal, *** Rib: Bilateral elevated first rib with TTP Thoracic: TTP paraspinal,*** Lumbar: TTP paraspinal,*** Pelvis: Right anterior innominate  Electronically signed by:  Benito Mccreedy D.[facility] 12:01 PM 10/23/22

## 2022-10-24 ENCOUNTER — Ambulatory Visit: Payer: 59 | Admitting: Sports Medicine

## 2022-10-24 VITALS — BP 120/78 | HR 71 | Ht 75.0 in | Wt 207.0 lb

## 2022-10-24 DIAGNOSIS — G8929 Other chronic pain: Secondary | ICD-10-CM | POA: Diagnosis not present

## 2022-10-24 DIAGNOSIS — M9903 Segmental and somatic dysfunction of lumbar region: Secondary | ICD-10-CM | POA: Diagnosis not present

## 2022-10-24 DIAGNOSIS — M533 Sacrococcygeal disorders, not elsewhere classified: Secondary | ICD-10-CM | POA: Diagnosis not present

## 2022-10-24 DIAGNOSIS — M5441 Lumbago with sciatica, right side: Secondary | ICD-10-CM

## 2022-10-24 DIAGNOSIS — M9904 Segmental and somatic dysfunction of sacral region: Secondary | ICD-10-CM

## 2022-10-24 DIAGNOSIS — M9905 Segmental and somatic dysfunction of pelvic region: Secondary | ICD-10-CM

## 2022-10-24 NOTE — Patient Instructions (Signed)
Good to see you

## 2022-11-05 ENCOUNTER — Other Ambulatory Visit: Payer: Self-pay | Admitting: Internal Medicine

## 2022-11-05 DIAGNOSIS — M1A09X0 Idiopathic chronic gout, multiple sites, without tophus (tophi): Secondary | ICD-10-CM

## 2022-11-05 DIAGNOSIS — M109 Gout, unspecified: Secondary | ICD-10-CM

## 2022-11-28 NOTE — Progress Notes (Signed)
Brendan Short D.[place] [facility] Phone: [phone]   Assessment and Plan:     1. Chronic left SI joint pain 2. Chronic bilateral low back pain with right-sided sciatica -Chronic, stable, subsequent visit - Overall improvement in low back pain and SI joint pain with regular OMT, HEP, lumbar epidural on 10/10/2022 and left SI CSI on 09/04/2022 - Patient's symptoms are currently well managed and patient recently retired which has decreased his daily physical demand - Continue HEP and Tylenol as needed for day-to-day pain relief  Pertinent previous records reviewed include none   Follow Up: As needed   Subjective:   I, Brendan Short, am serving as a Education administrator for Doctor [employer]   Chief Complaint: SI joint pain    HPI:  09/20/21 Patient is a 62 year old male presenting with low back pain that has been going on for the past 2 years with 2 flares in the past month. Patient saw his PCP 09/16/21 and 08/02/21 for this reason and has since been referred to sports medicine for evaluation and treatment. Patient locates pain to both the left and right side of the lower back and has been trying to be careful when lifting things at work. Patient is a Development worker, international aid.    Radiates: yes down the right upper leg Numbness/tingling:no Weakness:no Aggravates: bending and reaching. Or twisting  Treatments tried:prednisone, flexeril, back exercises, solanpas, ice, tens unit    10/18/21 Patient states that exercises are helping a lot able to do more bending and twisting stretching in the morning and at night sees a big difference    01/05/2022 Patient states that he is not feeling well but he has been feeling really good he is now stiff needs a tune up     01/26/2022 Patient states that he's been doing good got a little tightness in his neck , needs a tune up    02/23/2022 Patient states that he has been good , but now he has some butt pain that  goes down his leg when he gets in the truck it gets really tight that happens every time around this year he starts doing more weed eating stuff, cant get comfortable when he sits in the truck for a long time     03/20/2022 Patient states that he has been working real hard so its been kind of rough has been practicing with his golf game , last week he had a few night that he had to take flexeril and use his solnpas patch he is planning on retiring soon , some neck tightness on his left side    04/18/2022 Patient states tweaked his low back playing golf was able to finish but can feel it a little , upper back and neck are alright , 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    05/22/2022 Patient states fells pretty good just needs a tune up    07/03/2022 Patient states that he is pretty good, right side thoracic he is a little out of whack but slowly getting better than it was 2 weeks ago, low back is doing good    08/14/2022 Patient states went to a wedding this past weekend and then he went to play golf it is time for a tune up , low back is really really flared and locked up left side is sticking the SI joint   09/04/2022 Patient states he was playing golf yesterday and while  he was stretching he felt a "boom" and he has been taking flexeril and meloxicam lower back is locked up, cant get comfortable  he thinks he might need a csi or extra work thinks it  could be cause of the weather change but he is really jacked up     09/25/2022 Patient states that he is okay , csi helped a lot but still tender in the middle ,    10/24/2022 Patient states that epidural did good , went back to work and Monday he picked up a full sprayer and felt a tweak but the epidural spot is still good    11/29/2022 Patient states he is doing good,     Relevant Historical Information: History of multiple herniated disks in lumbar spine, gout  Additional pertinent review of systems  negative.  Current Outpatient Medications  Medication Sig Dispense Refill   azelastine (OPTIVAR) 0.05 % ophthalmic solution Place 1 drop into both eyes 2 (two) times daily. 6 mL 12   cholecalciferol (VITAMIN D) 1000 UNITS tablet Take 1,000 Units by mouth daily.     colchicine 0.6 MG tablet TAKE 1 TABLET BY MOUTH EVERY OTHER DAY 45 tablet 0   cyclobenzaprine (FLEXERIL) 5 MG tablet Take 1 tablet (5 mg total) by mouth at bedtime as needed for muscle spasms. 40 tablet 2   diclofenac (VOLTAREN) 0.1 % ophthalmic solution 4 (four) times daily. PRN     fluticasone (FLONASE) 50 MCG/ACT nasal spray Place 2 sprays into both nostrils daily. 48 g 1   glucosamine-chondroitin 500-400 MG tablet Take 1 tablet by mouth 3 (three) times daily.     ketoconazole (NIZORAL) 2 % cream Apply 1 application. topically 2 (two) times daily. 60 g 2   levocetirizine (XYZAL) 5 MG tablet Take 1 tablet (5 mg total) by mouth every evening. 90 tablet 1   meloxicam (MOBIC) 15 MG tablet Take 1 tablet (15 mg total) by mouth daily. 30 tablet 0   probenecid (BENEMID) 500 MG tablet TAKE 1 TABLET BY MOUTH TWICE A DAY 60 tablet 2   Turmeric 500 MG TABS Take by mouth.     No current facility-administered medications for this visit.      Objective:     Vitals:   11/29/22 0748  Pulse: 69  SpO2: 96%  Weight: 205 lb (93 kg)  Height: '6\' 3"'$  (1.905 m)      Body mass index is 25.62 kg/m.    Physical Exam:     Gen: Appears well, nad, nontoxic and pleasant Psych: Alert and oriented, appropriate mood and affect Neuro: sensation intact, strength is 5/5 in upper and lower extremities, muscle tone wnl Skin: no susupicious lesions or rashes  Back - Normal skin, Spine with normal alignment and no deformity.   No tenderness to vertebral process palpation.   Paraspinous muscles are not tender and without spasm NTTP gluteal musculature Straight leg raise negative Trendelenberg negative Piriformis Test negative   Electronically  signed by:  Brendan Short D.[facility] 8:05 AM 11/29/22

## 2022-11-29 ENCOUNTER — Ambulatory Visit: Payer: 59 | Admitting: Sports Medicine

## 2022-11-29 VITALS — HR 69 | Ht 75.0 in | Wt 205.0 lb

## 2022-11-29 DIAGNOSIS — G8929 Other chronic pain: Secondary | ICD-10-CM | POA: Diagnosis not present

## 2022-11-29 DIAGNOSIS — M533 Sacrococcygeal disorders, not elsewhere classified: Secondary | ICD-10-CM

## 2022-11-29 DIAGNOSIS — M5441 Lumbago with sciatica, right side: Secondary | ICD-10-CM | POA: Diagnosis not present

## 2022-11-29 NOTE — Patient Instructions (Signed)
Good to see you   

## 2022-12-19 ENCOUNTER — Other Ambulatory Visit: Payer: Self-pay | Admitting: Internal Medicine

## 2022-12-19 DIAGNOSIS — M1A09X0 Idiopathic chronic gout, multiple sites, without tophus (tophi): Secondary | ICD-10-CM

## 2023-01-30 ENCOUNTER — Other Ambulatory Visit: Payer: Self-pay | Admitting: Internal Medicine

## 2023-01-30 DIAGNOSIS — M1A09X0 Idiopathic chronic gout, multiple sites, without tophus (tophi): Secondary | ICD-10-CM

## 2023-01-30 DIAGNOSIS — M109 Gout, unspecified: Secondary | ICD-10-CM

## 2023-02-07 ENCOUNTER — Encounter: Payer: 59 | Admitting: Internal Medicine

## 2023-03-07 ENCOUNTER — Ambulatory Visit (INDEPENDENT_AMBULATORY_CARE_PROVIDER_SITE_OTHER): Payer: 59 | Admitting: Internal Medicine

## 2023-03-07 ENCOUNTER — Encounter: Payer: Self-pay | Admitting: Internal Medicine

## 2023-03-07 VITALS — BP 126/82 | HR 68 | Temp 98.1°F | Ht 75.0 in | Wt 208.0 lb

## 2023-03-07 DIAGNOSIS — R739 Hyperglycemia, unspecified: Secondary | ICD-10-CM

## 2023-03-07 DIAGNOSIS — D510 Vitamin B12 deficiency anemia due to intrinsic factor deficiency: Secondary | ICD-10-CM

## 2023-03-07 DIAGNOSIS — I10 Essential (primary) hypertension: Secondary | ICD-10-CM | POA: Diagnosis not present

## 2023-03-07 DIAGNOSIS — Z0001 Encounter for general adult medical examination with abnormal findings: Secondary | ICD-10-CM

## 2023-03-07 DIAGNOSIS — G8929 Other chronic pain: Secondary | ICD-10-CM

## 2023-03-07 DIAGNOSIS — E781 Pure hyperglyceridemia: Secondary | ICD-10-CM | POA: Insufficient documentation

## 2023-03-07 DIAGNOSIS — R0609 Other forms of dyspnea: Secondary | ICD-10-CM | POA: Diagnosis not present

## 2023-03-07 DIAGNOSIS — Z0000 Encounter for general adult medical examination without abnormal findings: Secondary | ICD-10-CM

## 2023-03-07 DIAGNOSIS — M545 Low back pain, unspecified: Secondary | ICD-10-CM

## 2023-03-07 DIAGNOSIS — R972 Elevated prostate specific antigen [PSA]: Secondary | ICD-10-CM

## 2023-03-07 DIAGNOSIS — M1A09X0 Idiopathic chronic gout, multiple sites, without tophus (tophi): Secondary | ICD-10-CM

## 2023-03-07 LAB — LIPID PANEL
Cholesterol: 168 mg/dL (ref 0–200)
HDL: 40.3 mg/dL (ref >39.00)
LDL Cholesterol: 100 mg/dL — ABNORMAL HIGH (ref 0–99)
NonHDL: 127.68
Total CHOL/HDL Ratio: 4
Triglycerides: 137 mg/dL (ref 0.0–149.0)
VLDL: 27.4 mg/dL (ref 0.0–40.0)

## 2023-03-07 LAB — BASIC METABOLIC PANEL
BUN: 15 mg/dL (ref 6–23)
CO2: 30 mEq/L (ref 19–32)
Calcium: 9.2 mg/dL (ref 8.4–10.5)
Chloride: 103 mEq/L (ref 96–112)
Creatinine, Ser: 0.95 mg/dL (ref 0.40–1.50)
GFR: 85.51 mL/min (ref >60.00)
Glucose, Bld: 113 mg/dL — ABNORMAL HIGH (ref 70–99)
Potassium: 4.9 mEq/L (ref 3.5–5.1)
Sodium: 141 mEq/L (ref 135–145)

## 2023-03-07 LAB — VITAMIN B12: Vitamin B-12: 811 pg/mL (ref 211–911)

## 2023-03-07 LAB — HEMOGLOBIN A1C: Hgb A1c MFr Bld: 5.8 % (ref 4.6–6.5)

## 2023-03-07 LAB — PSA: PSA: 4.11 ng/mL — ABNORMAL HIGH (ref 0.10–4.00)

## 2023-03-07 LAB — HEPATIC FUNCTION PANEL
ALT: 23 U/L (ref 0–53)
AST: 20 U/L (ref 0–37)
Albumin: 4.4 g/dL (ref 3.5–5.2)
Alkaline Phosphatase: 55 U/L (ref 39–117)
Bilirubin, Direct: 0.1 mg/dL (ref 0.0–0.3)
Total Bilirubin: 0.7 mg/dL (ref 0.2–1.2)
Total Protein: 6.8 g/dL (ref 6.0–8.3)

## 2023-03-07 LAB — TROPONIN I (HIGH SENSITIVITY): High Sens Troponin I: 3 ng/L (ref 2–17)

## 2023-03-07 LAB — BRAIN NATRIURETIC PEPTIDE: Pro B Natriuretic peptide (BNP): 15 pg/mL (ref 0.0–100.0)

## 2023-03-07 MED ORDER — MELOXICAM 15 MG PO TABS: 15.0000 mg | ORAL_TABLET | Freq: Every day | ORAL | 0 refills | Status: DC

## 2023-03-07 MED ORDER — CYCLOBENZAPRINE HCL 5 MG PO TABS
5.0000 mg | ORAL_TABLET | Freq: Every evening | ORAL | 2 refills | Status: AC | PRN
Start: 2023-03-07 — End: ?

## 2023-03-07 NOTE — Progress Notes (Signed)
Subjective:  Patient ID: Brendan Short, male    DOB: [birth year]  Age: 62 y.o. MRN: [id number]  CC: Annual Exam, Hyperlipidemia, Osteoarthritis, Hypertension, and Back Pain   HPI Brendan Short presents for a CPX and f/up ---   He walks about 5 miles a day but complains of dyspnea on exertion after the first few miles.  He denies chest pain, diaphoresis, dizziness, lightheadedness, or edema.  Outpatient Medications Prior to Visit  Medication Sig Dispense Refill   azelastine (OPTIVAR) 0.05 % ophthalmic solution Place 1 drop into both eyes 2 (two) times daily. 6 mL 12   cholecalciferol (VITAMIN D) 1000 UNITS tablet Take 1,000 Units by mouth daily.     colchicine 0.6 MG tablet TAKE 1 TABLET BY MOUTH EVERY OTHER DAY 45 tablet 1   diclofenac (VOLTAREN) 0.1 % ophthalmic solution 4 (four) times daily. PRN     fluticasone (FLONASE) 50 MCG/ACT nasal spray Place 2 sprays into both nostrils daily. 48 g 1   glucosamine-chondroitin 500-400 MG tablet Take 1 tablet by mouth 3 (three) times daily.     ketoconazole (NIZORAL) 2 % cream Apply 1 application. topically 2 (two) times daily. 60 g 2   levocetirizine (XYZAL) 5 MG tablet Take 1 tablet (5 mg total) by mouth every evening. 90 tablet 1   probenecid (BENEMID) 500 MG tablet TAKE 1 TABLET BY MOUTH TWICE A DAY 60 tablet 2   Turmeric 500 MG TABS Take by mouth.     cyclobenzaprine (FLEXERIL) 5 MG tablet Take 1 tablet (5 mg total) by mouth at bedtime as needed for muscle spasms. 40 tablet 2   meloxicam (MOBIC) 15 MG tablet Take 1 tablet (15 mg total) by mouth daily. 30 tablet 0   No facility-administered medications prior to visit.    ROS Review of Systems  Constitutional:  Negative for chills, diaphoresis, fatigue and fever.  HENT: Negative.    Eyes: Negative.   Respiratory:  Positive for shortness of breath. Negative for cough, chest tightness and wheezing.   Cardiovascular:  Negative for chest pain, palpitations and leg swelling.   Gastrointestinal:  Negative for abdominal pain, constipation, diarrhea, nausea and vomiting.  Endocrine: Negative.   Genitourinary: Negative.  Negative for difficulty urinating and dysuria.  Musculoskeletal:  Positive for arthralgias and back pain. Negative for joint swelling and myalgias.  Skin:  Negative for color change and pallor.  Neurological: Negative.  Negative for dizziness, weakness, light-headedness and numbness.  Hematological:  Negative for adenopathy. Does not bruise/bleed easily.  Psychiatric/Behavioral: Negative.      Objective:  BP 126/82 (BP Location: Right Arm, Patient Position: Sitting, Cuff Size: Large)   Pulse 68   Temp 98.1 F (36.7 C) (Oral)   Ht 6\' 3"  (1.905 m)   Wt 208 lb (94.3 kg)   SpO2 94%   BMI 26.00 kg/m   BP Readings from Last 3 Encounters:  03/07/23 126/82  10/24/22 120/78  10/11/22 (!) 142/86    Wt Readings from Last 3 Encounters:  03/07/23 208 lb (94.3 kg)  11/29/22 205 lb (93 kg)  10/24/22 207 lb (93.9 kg)    Physical Exam Vitals reviewed.  Constitutional:      Appearance: Normal appearance.  HENT:     Nose: Nose normal.     Mouth/Throat:     Mouth: Mucous membranes are moist.  Eyes:     General: No scleral icterus.    Conjunctiva/sclera: Conjunctivae normal.  Cardiovascular:     Rate and Rhythm: Normal  rate and regular rhythm.     Heart sounds: Normal heart sounds, S1 normal and S2 normal. No murmur heard.    No gallop.     Comments: EKG- NSR, 71 bpm No LVH or Q waves Normal EKG Pulmonary:     Effort: Pulmonary effort is normal.     Breath sounds: No stridor. No wheezing, rhonchi or rales.  Abdominal:     Palpations: There is no mass.     Tenderness: There is no abdominal tenderness. There is no guarding.     Hernia: No hernia is present.  Musculoskeletal:     Cervical back: Neck supple.     Right lower leg: No edema.     Left lower leg: No edema.  Lymphadenopathy:     Cervical: No cervical adenopathy.  Skin:     General: Skin is warm and dry.     Coloration: Skin is not pale.     Findings: No rash.  Neurological:     General: No focal deficit present.     Mental Status: He is alert and oriented to person, place, and time.  Psychiatric:        Mood and Affect: Mood normal.        Behavior: Behavior normal.     Lab Results  Component Value Date   WBC 9.8 10/11/2022   HGB 14.8 10/11/2022   HCT 42.6 10/11/2022   PLT 157.0 10/11/2022   GLUCOSE 113 (H) 03/07/2023   CHOL 168 03/07/2023   TRIG 137.0 03/07/2023   HDL 40.30 03/07/2023   LDLCALC 100 (H) 03/07/2023   ALT 23 03/07/2023   AST 20 03/07/2023   NA 141 03/07/2023   K 4.9 03/07/2023   CL 103 03/07/2023   CREATININE 0.95 03/07/2023   BUN 15 03/07/2023   CO2 30 03/07/2023   TSH 2.96 01/04/2022   PSA 4.11 (H) 03/07/2023   HGBA1C 5.8 03/07/2023    DG INJECT DIAG/THERA/INC NEEDLE/CATH/PLC EPI/LUMB/SAC W/IMG  Result Date: 10/10/2022 CLINICAL DATA:  Lumbosacral spondylosis without myelopathy. Bilateral low back pain. Right-sided disc herniation at L4-5. FLUOROSCOPY: Radiation Exposure Index (as provided by the fluoroscopic device): 1.10 mGy Kerma PROCEDURE: The procedure, risks, benefits, and alternatives were explained to the patient. Questions regarding the procedure were encouraged and answered. The patient understands and consents to the procedure. LUMBAR EPIDURAL INJECTION: An interlaminar approach was performed on the right at L4-5. The overlying skin was cleansed and anesthetized. A 3.5 inch 20 gauge epidural needle was advanced using loss-of-resistance technique. DIAGNOSTIC EPIDURAL INJECTION: Injection of Isovue-M 200 shows a good epidural pattern with spread above and below the level of needle placement, primarily on the right. No vascular opacification is seen. THERAPEUTIC EPIDURAL INJECTION: 80 mg of Depo-Medrol mixed with 3 mL of 1% lidocaine were instilled. The procedure was well-tolerated, and the patient was discharged thirty  minutes following the injection in good condition. COMPLICATIONS: None immediate IMPRESSION: Technically successful interlaminar epidural injection on the right at L4-5. Electronically Signed   By: Sebastian Ache M.D.   On: 10/10/2022 12:34    Assessment & Plan:   Vitamin B12 deficiency anemia due to intrinsic factor deficiency -     Vitamin B12; Future  Primary hypertension- His blood pressure is well-controlled. -     Hepatic function panel; Future -     Basic metabolic panel; Future -     EKG 12-Lead  Idiopathic chronic gout of multiple sites without tophus -     Basic metabolic panel; Future -  Meloxicam; Take 1 tablet (15 mg total) by mouth daily.  Dispense: 90 tablet; Refill: 0  Hyperglycemia -     Hemoglobin A1c; Future -     Hepatic function panel; Future -     Basic metabolic panel; Future  Encounter for general adult medical examination with abnormal findings- Exam completed, labs reviewed, vaccines reviewed and updated, cancer screenings up-to-date. -     PSA; Future  Pure hyperglyceridemia -     Lipid panel; Future -     Hepatic function panel; Future  DOE (dyspnea on exertion)- EKG and labs are reassuring.  He has an ASCVD risk score of about 10%.  I recommended that he undergo a coronary calcium score. -     Brain natriuretic peptide; Future -     Troponin I (High Sensitivity); Future -     CT CARDIAC SCORING (SELF PAY ONLY); Future  Chronic low back pain without sciatica, unspecified back pain laterality -     Cyclobenzaprine HCl; Take 1 tablet (5 mg total) by mouth at bedtime as needed for muscle spasms.  Dispense: 40 tablet; Refill: 2 -     Meloxicam; Take 1 tablet (15 mg total) by mouth daily.  Dispense: 90 tablet; Refill: 0  Rising PSA level- Will recheck this in 3 to 4 months.     Follow-up: Return in about 6 months (around 09/06/2023).  Sanda Linger, MD

## 2023-03-07 NOTE — Patient Instructions (Signed)
Health Maintenance, Male Adopting a healthy lifestyle and getting preventive care are important in promoting health and wellness. Ask your health care provider about: The right schedule for you to have regular tests and exams. Things you can do on your own to prevent diseases and keep yourself healthy. What should I know about diet, weight, and exercise? Eat a healthy diet  Eat a diet that includes plenty of vegetables, fruits, low-fat dairy products, and lean protein. Do not eat a lot of foods that are high in solid fats, added sugars, or sodium. Maintain a healthy weight Body mass index (BMI) is a measurement that can be used to identify possible weight problems. It estimates body fat based on height and weight. Your health care provider can help determine your BMI and help you achieve or maintain a healthy weight. Get regular exercise Get regular exercise. This is one of the most important things you can do for your health. Most adults should: Exercise for at least 150 minutes each week. The exercise should increase your heart rate and make you sweat (moderate-intensity exercise). Do strengthening exercises at least twice a week. This is in addition to the moderate-intensity exercise. Spend less time sitting. Even light physical activity can be beneficial. Watch cholesterol and blood lipids Have your blood tested for lipids and cholesterol at 63 years of age, then have this test every 5 years. You may need to have your cholesterol levels checked more often if: Your lipid or cholesterol levels are high. You are older than 63 years of age. You are at high risk for heart disease. What should I know about cancer screening? Many types of cancers can be detected early and may often be prevented. Depending on your health history and family history, you may need to have cancer screening at various ages. This may include screening for: Colorectal cancer. Prostate cancer. Skin cancer. Lung  cancer. What should I know about heart disease, diabetes, and high blood pressure? Blood pressure and heart disease High blood pressure causes heart disease and increases the risk of stroke. This is more likely to develop in people who have high blood pressure readings or are overweight. Talk with your health care provider about your target blood pressure readings. Have your blood pressure checked: Every 3-5 years if you are 18-39 years of age. Every year if you are 40 years old or older. If you are between the ages of 65 and 75 and are a current or former smoker, ask your health care provider if you should have a one-time screening for abdominal aortic aneurysm (AAA). Diabetes Have regular diabetes screenings. This checks your fasting blood sugar level. Have the screening done: Once every three years after age 45 if you are at a normal weight and have a low risk for diabetes. More often and at a younger age if you are overweight or have a high risk for diabetes. What should I know about preventing infection? Hepatitis B If you have a higher risk for hepatitis B, you should be screened for this virus. Talk with your health care provider to find out if you are at risk for hepatitis B infection. Hepatitis C Blood testing is recommended for: Everyone born from 1945 through 1965. Anyone with known risk factors for hepatitis C. Sexually transmitted infections (STIs) You should be screened each year for STIs, including gonorrhea and chlamydia, if: You are sexually active and are younger than 63 years of age. You are older than 63 years of age and your   health care provider tells you that you are at risk for this type of infection. Your sexual activity has changed since you were last screened, and you are at increased risk for chlamydia or gonorrhea. Ask your health care provider if you are at risk. Ask your health care provider about whether you are at high risk for HIV. Your health care provider  may recommend a prescription medicine to help prevent HIV infection. If you choose to take medicine to prevent HIV, you should first get tested for HIV. You should then be tested every 3 months for as long as you are taking the medicine. Follow these instructions at home: Alcohol use Do not drink alcohol if your health care provider tells you not to drink. If you drink alcohol: Limit how much you have to 0-2 drinks a day. Know how much alcohol is in your drink. In the [place] one drink equals one 12 oz bottle of beer (355 mL), one 5 oz glass of wine (148 mL), or one 1 oz glass of hard liquor (44 mL). Lifestyle Do not use any products that contain nicotine or tobacco. These products include cigarettes, chewing tobacco, and vaping devices, such as e-cigarettes. If you need help quitting, ask your health care provider. Do not use street drugs. Do not share needles. Ask your health care provider for help if you need support or information about quitting drugs. General instructions Schedule regular health, dental, and eye exams. Stay current with your vaccines. Tell your health care provider if: You often feel depressed. You have ever been abused or do not feel safe at home. Summary Adopting a healthy lifestyle and getting preventive care are important in promoting health and wellness. Follow your health care provider's instructions about healthy diet, exercising, and getting tested or screened for diseases. Follow your health care provider's instructions on monitoring your cholesterol and blood pressure. This information is not intended to replace advice given to you by your health care provider. Make sure you discuss any questions you have with your health care provider. Document Revised: 03/21/2021 Document Reviewed: 03/21/2021 Elsevier Patient Education  2023 [employer]  

## 2023-03-09 ENCOUNTER — Encounter: Payer: Self-pay | Admitting: Internal Medicine

## 2023-04-16 ENCOUNTER — Ambulatory Visit
Admission: RE | Admit: 2023-04-16 | Discharge: 2023-04-16 | Disposition: A | Discharge status: Home / Self Care | Payer: No Typology Code available for payment source | Source: Ambulatory Visit | Attending: Internal Medicine | Admitting: Internal Medicine

## 2023-04-16 DIAGNOSIS — R0609 Other forms of dyspnea: Secondary | ICD-10-CM

## 2023-04-17 ENCOUNTER — Other Ambulatory Visit: Payer: Self-pay | Admitting: Internal Medicine

## 2023-04-17 DIAGNOSIS — R918 Other nonspecific abnormal finding of lung field: Secondary | ICD-10-CM | POA: Insufficient documentation

## 2023-04-23 ENCOUNTER — Other Ambulatory Visit: Payer: Self-pay | Admitting: Internal Medicine

## 2023-04-23 DIAGNOSIS — M1A09X0 Idiopathic chronic gout, multiple sites, without tophus (tophi): Secondary | ICD-10-CM

## 2023-04-23 DIAGNOSIS — M109 Gout, unspecified: Secondary | ICD-10-CM

## 2023-05-11 ENCOUNTER — Encounter: Payer: Self-pay | Admitting: Internal Medicine

## 2023-05-22 ENCOUNTER — Ambulatory Visit
Admission: RE | Admit: 2023-05-22 | Discharge: 2023-05-22 | Disposition: A | Discharge status: Home / Self Care | Payer: 59 | Source: Ambulatory Visit | Attending: Internal Medicine | Admitting: Internal Medicine

## 2023-05-22 DIAGNOSIS — R918 Other nonspecific abnormal finding of lung field: Secondary | ICD-10-CM

## 2023-05-28 ENCOUNTER — Telehealth: Payer: Self-pay | Admitting: Internal Medicine

## 2023-05-28 ENCOUNTER — Other Ambulatory Visit: Payer: Self-pay | Admitting: Internal Medicine

## 2023-05-28 DIAGNOSIS — J984 Other disorders of lung: Secondary | ICD-10-CM | POA: Insufficient documentation

## 2023-06-12 ENCOUNTER — Encounter: Payer: Self-pay | Admitting: Internal Medicine

## 2023-06-12 ENCOUNTER — Ambulatory Visit: Payer: 59 | Admitting: Internal Medicine

## 2023-06-12 VITALS — BP 124/78 | HR 61 | Temp 98.0°F | Resp 16 | Ht 75.0 in | Wt 209.0 lb

## 2023-06-12 DIAGNOSIS — I10 Essential (primary) hypertension: Secondary | ICD-10-CM

## 2023-06-12 DIAGNOSIS — R972 Elevated prostate specific antigen [PSA]: Secondary | ICD-10-CM | POA: Diagnosis not present

## 2023-06-12 DIAGNOSIS — M1A09X0 Idiopathic chronic gout, multiple sites, without tophus (tophi): Secondary | ICD-10-CM

## 2023-06-12 DIAGNOSIS — I7121 Aneurysm of the ascending aorta, without rupture: Secondary | ICD-10-CM

## 2023-06-12 LAB — BASIC METABOLIC PANEL
BUN: 18 mg/dL (ref 6–23)
CO2: 26 mEq/L (ref 19–32)
Calcium: 9.6 mg/dL (ref 8.4–10.5)
Chloride: 104 mEq/L (ref 96–112)
Creatinine, Ser: 1.04 mg/dL (ref 0.40–1.50)
GFR: 76.56 mL/min (ref >60.00)
Glucose, Bld: 122 mg/dL — ABNORMAL HIGH (ref 70–99)
Potassium: 4.2 mEq/L (ref 3.5–5.1)
Sodium: 139 mEq/L (ref 135–145)

## 2023-06-12 LAB — PSA: PSA: 3.83 ng/mL (ref 0.10–4.00)

## 2023-06-12 LAB — URIC ACID: Uric Acid, Serum: 5.9 mg/dL (ref 4.0–7.8)

## 2023-06-12 MED ORDER — COLCHICINE 0.6 MG PO TABS: ORAL_TABLET | ORAL | 1 refills | Status: DC

## 2023-06-12 NOTE — Progress Notes (Unsigned)
Subjective:  Patient ID: Brendan Short, male    DOB: [birth year]  Age: 62 y.o. MRN: [id number]  CC: No chief complaint on file.   HPI Brendan Short presents for f/up ----  Discussed the use of AI scribe software for clinical note transcription with the patient, who gave verbal consent to proceed.  History of Present Illness   The patient, with a history of pneumonitis and aortic aneurysm, presents with concerns about his respiratory health. He reports experiencing shortness of breath during physical activities such as walking and working out, which he attributes to potential lung issues. The patient has a history of exposure to diesel mowers, backpack blowers, and weed eaters, as well as spraying chemicals, which he suspects may have contributed to his lung condition.  In addition to his respiratory concerns, the patient also mentions an aortic aneurysm, which is currently 4 cm in size. He expresses concern about this due to his mother's history of fatal aortic aneurysm.  The patient also reports occasional dizziness when bending down quickly and then standing up again. However, he denies any episodes of syncope.  Regarding his urological health, the patient reports nocturia, usually waking up once a night to urinate, although this can increase to twice a night if he has consumed alcohol or a large amount of water. He has an upcoming appointment with a urologist.  The patient denies any joint pain or swelling, and his gout has been well-managed with medication. He previously had plantar fasciitis, but this has resolved. He is due for a colonoscopy, with the last one having been performed in 2015.       Outpatient Medications Prior to Visit  Medication Sig Dispense Refill   azelastine (OPTIVAR) 0.05 % ophthalmic solution Place 1 drop into both eyes 2 (two) times daily. 6 mL 12   cholecalciferol (VITAMIN D) 1000 UNITS tablet Take 1,000 Units by mouth daily.     colchicine 0.6 MG tablet TAKE  1 TABLET BY MOUTH EVERY OTHER DAY 45 tablet 1   cyclobenzaprine (FLEXERIL) 5 MG tablet Take 1 tablet (5 mg total) by mouth at bedtime as needed for muscle spasms. 40 tablet 2   diclofenac (VOLTAREN) 0.1 % ophthalmic solution 4 (four) times daily. PRN     fluticasone (FLONASE) 50 MCG/ACT nasal spray Place 2 sprays into both nostrils daily. 48 g 1   glucosamine-chondroitin 500-400 MG tablet Take 1 tablet by mouth 3 (three) times daily.     ketoconazole (NIZORAL) 2 % cream Apply 1 application. topically 2 (two) times daily. 60 g 2   levocetirizine (XYZAL) 5 MG tablet Take 1 tablet (5 mg total) by mouth every evening. 90 tablet 1   meloxicam (MOBIC) 15 MG tablet Take 1 tablet (15 mg total) by mouth daily. 90 tablet 0   probenecid (BENEMID) 500 MG tablet TAKE 1 TABLET BY MOUTH TWICE A DAY 60 tablet 2   Turmeric 500 MG TABS Take by mouth.     No facility-administered medications prior to visit.    ROS Review of Systems  Objective:  BP 124/78 (BP Location: Left Arm, Patient Position: Sitting, Cuff Size: Large)   Pulse 61   Temp 98 F (36.7 C) (Oral)   Resp 16   Ht 6\' 3"  (1.905 m)   Wt 209 lb (94.8 kg)   SpO2 98%   BMI 26.12 kg/m   BP Readings from Last 3 Encounters:  06/12/23 124/78  03/07/23 126/82  10/24/22 120/78    Wt Readings  from Last 3 Encounters:  06/12/23 209 lb (94.8 kg)  03/07/23 208 lb (94.3 kg)  11/29/22 205 lb (93 kg)    Physical Exam  Lab Results  Component Value Date   WBC 9.8 10/11/2022   HGB 14.8 10/11/2022   HCT 42.6 10/11/2022   PLT 157.0 10/11/2022   GLUCOSE 113 (H) 03/07/2023   CHOL 168 03/07/2023   TRIG 137.0 03/07/2023   HDL 40.30 03/07/2023   LDLCALC 100 (H) 03/07/2023   ALT 23 03/07/2023   AST 20 03/07/2023   NA 141 03/07/2023   K 4.9 03/07/2023   CL 103 03/07/2023   CREATININE 0.95 03/07/2023   BUN 15 03/07/2023   CO2 30 03/07/2023   TSH 2.96 01/04/2022   PSA 4.11 (H) 03/07/2023   HGBA1C 5.8 03/07/2023    CT Chest High  Resolution  Result Date: 05/27/2023 CLINICAL DATA:  Follow-up pulmonary nodules, dyspnea on exertion, shortness of breath EXAM: CT CHEST WITHOUT CONTRAST TECHNIQUE: Multidetector CT imaging of the chest was performed following the standard protocol without intravenous contrast. High resolution imaging of the lungs, as well as inspiratory and expiratory imaging, was performed. RADIATION DOSE REDUCTION: This exam was performed according to the departmental dose-optimization program which includes automated exposure control, adjustment of the mA and/or kV according to patient size and/or use of iterative reconstruction technique. COMPARISON:  CT chest coronary calcium scoring, 04/16/2023 CT chest angiogram, 01/24/2022 FINDINGS: Cardiovascular: Scattered aortic atherosclerosis. 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 Normal heart size. No pericardial effusion. Mediastinum/Nodes: No enlarged mediastinal, hilar, or axillary lymph nodes. Thyroid gland, trachea, and esophagus demonstrate no significant findings. Lungs/Pleura: Interval resolution of a previously seen ground-glass nodule of the peripheral left lower lobe (series 10, image 92). Mild, diffuse peripheral ground-glass and irregular interstitial opacity, generally with an upper lobe predominance (series 10, image 55). This is similar in appearance to recent prior examination dated 04/16/2023 but more conspicuous in comparison to prior examination dated 01/24/2022. There may be some fine centrilobular nodularity at the apices (series 10, image 36). Extensive lobular air trapping on expiratory phase imaging. No pleural effusion or pneumothorax. Upper Abdomen: No acute abnormality. Musculoskeletal: No chest wall abnormality. No acute osseous findings. IMPRESSION: 1. Mild, diffuse peripheral ground-glass and irregular interstitial opacity, generally with an upper lobe predominance. This is similar in appearance to  recent prior examination dated 04/16/2023 but more conspicuous in comparison prior examination dated 01/24/2022. There may be some fine centrilobular nodularity in the apices. Extensive lobular air trapping on expiratory phase imaging. 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 Findings are suggestive of an alternative diagnosis (not UIP) per consensus guidelines: Diagnosis of Idiopathic Pulmonary Fibrosis: An Official ATS/ERS/JRS/ALAT Clinical Practice Guideline. Am Rosezetta Schlatter Crit Care Med Vol 198, Iss 5, [phone], Jul 14 2017. 2. Interval resolution of a previously seen ground-glass nodule of the peripheral left lower lobe. 3. 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 Recommend annual imaging followup by CTA or MRA otherwise. This recommendation follows 2010 ACCF/AHA/AATS/ACR/ASA/SCA/SCAI/SIR/STS/SVM Guidelines for the Diagnosis and Management of Patients with Thoracic Aortic Disease. Circulation. 2010; 121: V409-W119. Aortic aneurysm NOS (ICD10-I71.9) Aortic Atherosclerosis (ICD10-I70.0). Electronically Signed   By: Jearld Lesch M.D.   On: 05/27/2023 21:39    Assessment & Plan:  Rising PSA level -     PSA; Future  Primary hypertension -  Basic metabolic panel; Future  Aneurysm of ascending aorta without rupture (HCC)  Idiopathic chronic gout of multiple sites without tophus -     Uric acid; Future -     Basic metabolic panel; Future     Follow-up: Return in about 6 months (around 12/13/2023).  Sanda Linger, MD

## 2023-06-12 NOTE — Patient Instructions (Signed)
Gout  Gout is a condition that causes painful swelling of the joints. Gout is a type of inflammation of the joints (arthritis). This condition is caused by having too much uric acid in the body. Uric acid is a chemical that forms when the body breaks down substances called purines. Purines are important for building body proteins. When the body has too much uric acid, sharp crystals can form and build up inside the joints. This causes pain and swelling. Gout attacks can happen quickly and may be very painful (acute gout). Over time, the attacks can affect more joints and become more frequent (chronic gout). Gout can also cause uric acid to build up under the skin and inside the kidneys. What are the causes? This condition is caused by too much uric acid in your blood. This can happen because: Your kidneys do not remove enough uric acid from your blood. This is the most common cause. Your body makes too much uric acid. This can happen with some cancers and cancer treatments. It can also occur if your body is breaking down too many red blood cells (hemolytic anemia). You eat too many foods that are high in purines. These foods include organ meats and some seafood. Alcohol, especially beer, is also high in purines. A gout attack may be triggered by trauma or stress. What increases the risk? The following factors may make you more likely to develop this condition: Having a family history of gout. Being male and middle-aged. Being male and having gone through menopause. Taking certain medicines, including aspirin, cyclosporine, diuretics, levodopa, and niacin. Having an organ transplant. Having certain conditions, such as: Being obese. Lead poisoning. Kidney disease. A skin condition called psoriasis. Other factors include: Losing weight too quickly. Being dehydrated. Frequently drinking alcohol, especially beer. Frequently drinking beverages that are sweetened with a type of sugar called  fructose. What are the signs or symptoms? An attack of acute gout happens quickly. It usually occurs in just one joint. The most common place is the big toe. Attacks often start at night. Other joints that may be affected include joints of the feet, ankle, knee, fingers, wrist, or elbow. Symptoms of this condition may include: Severe pain. Warmth. Swelling. Stiffness. Tenderness. The affected joint may be very painful to touch. Shiny, red, or purple skin. Chills and fever. Chronic gout may cause symptoms more frequently. More joints may be involved. You may also have white or yellow lumps (tophi) on your hands or feet or in other areas near your joints. How is this diagnosed? This condition is diagnosed based on your symptoms, your medical history, and a physical exam. You may have tests, such as: Blood tests to measure uric acid levels. Removal of joint fluid with a thin needle (aspiration) to look for uric acid crystals. X-rays to look for joint damage. How is this treated? Treatment for this condition has two phases: treating an acute attack and preventing future attacks. Acute gout treatment may include medicines to reduce pain and swelling, including: NSAIDs, such as ibuprofen. Steroids. These are strong anti-inflammatory medicines that can be taken by mouth (orally) or injected into a joint. Colchicine. This medicine relieves pain and swelling when it is taken soon after an attack. It can be given by mouth or through an IV. Preventive treatment may include: Daily use of smaller doses of NSAIDs or colchicine. Use of a medicine that reduces uric acid levels in your blood, such as allopurinol. Changes to your diet. You may need to see   a dietitian about what to eat and drink to prevent gout. Follow these instructions at home: During a gout attack  If directed, put ice on the affected area. To do this: Put ice in a plastic bag. Place a towel between your skin and the bag. Leave the  ice on for 20 minutes, 2-3 times a day. Remove the ice if your skin turns bright red. This is very important. If you cannot feel pain, heat, or cold, you have a greater risk of damage to the area. Raise (elevate) the affected joint above the level of your heart as often as possible. Rest the joint as much as possible. If the affected joint is in your leg, you may be given crutches to use. Follow instructions from your health care provider about eating or drinking restrictions. Avoiding future gout attacks Follow a low-purine diet as told by your dietitian or health care provider. Avoid foods and drinks that are high in purines, including liver, kidney, anchovies, asparagus, herring, mushrooms, mussels, and beer. Maintain a healthy weight or lose weight if you are overweight. If you want to lose weight, talk with your health care provider. Do not lose weight too quickly. Start or maintain an exercise program as told by your health care provider. Eating and drinking Avoid drinking beverages that contain fructose. Drink enough fluids to keep your urine pale yellow. If you drink alcohol: Limit how much you have to: 0-1 drink a day for women who are not pregnant. 0-2 drinks a day for men. Know how much alcohol is in a drink. In the [place] one drink equals one 12 oz bottle of beer (355 mL), one 5 oz glass of wine (148 mL), or one 1 oz glass of hard liquor (44 mL). General instructions Take over-the-counter and prescription medicines only as told by your health care provider. Ask your health care provider if the medicine prescribed to you requires you to avoid driving or using machinery. Return to your normal activities as told by your health care provider. Ask your health care provider what activities are safe for you. Keep all follow-up visits. This is important. Where to find more information National Institutes of Health: www.niams.nih.gov Contact a health care provider if you have: Another  gout attack. Continuing symptoms of a gout attack after 10 days of treatment. Side effects from your medicines. Chills or a fever. Burning pain when you urinate. Pain in your lower back or abdomen. Get help right away if you: Have severe or uncontrolled pain. Cannot urinate. Summary Gout is painful swelling of the joints caused by having too much uric acid in the body. The most common site for gout to occur is in the big toe, but it can affect other joints in the body. Medicines and dietary changes can help to prevent and treat gout attacks. This information is not intended to replace advice given to you by your health care provider. Make sure you discuss any questions you have with your health care provider. Document Revised: 08/03/2021 Document Reviewed: 08/03/2021 Elsevier Patient Education  2024 Elsevier Inc.  

## 2023-07-24 ENCOUNTER — Ambulatory Visit: Payer: 59 | Admitting: Pulmonary Disease

## 2023-07-24 ENCOUNTER — Encounter: Payer: Self-pay | Admitting: Pulmonary Disease

## 2023-07-24 VITALS — BP 140/88 | HR 68 | Temp 97.8°F | Ht 75.0 in | Wt 209.8 lb

## 2023-07-24 DIAGNOSIS — J849 Interstitial pulmonary disease, unspecified: Secondary | ICD-10-CM

## 2023-07-24 LAB — C-REACTIVE PROTEIN: CRP: <1 mg/dL (ref 0.5–20.0)

## 2023-07-24 LAB — SEDIMENTATION RATE: Sed Rate: 7 mm/h (ref 0–20)

## 2023-07-24 MED ORDER — FLUTICASONE-SALMETEROL 250-50 MCG/ACT IN AEPB: 1.0000 | INHALATION_SPRAY | Freq: Two times a day (BID) | RESPIRATORY_TRACT | 3 refills | Status: DC

## 2023-07-24 NOTE — Progress Notes (Signed)
@Patient  ID: Brendan Short, male    DOB: 1960-11-28, 62 y.o.   MRN: [id number]  Chief Complaint  Patient presents with  . Consult    SOB x 1 year.  Abn CT chest    Referring provider: Etta Grandchild, MD  HPI:   14 y.o. man whom we are seeing for evaluation of abnormal CT scan.  Multiple notes from PCP reviewed.  Chief complaint is shortness of breath.  Overall patient feels relatively well.  Does describe some dyspnea on exertion for about 1 year.  Little more labored doing activities compared to prior.  Sounds like a CT of the coronaries was obtained just for routine surveillance.  Finding scribe below.  On questioning he did not describe shortness of breath.  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  No birds.  Sprayed chemicals but no pain or other solvents.  No other pets.  Does have at long and seasonal allergy.  Just asthma is possible they often go hand-in-hand.  Chart  As part of workup CT coronary scan was obtained 04/2023 that showed mild subpleural reticular densities in both lungs, low coronary calcium score.  This prompted a high-res CT scan that demonstrated mild groundglass nodular-like opacities predominantly in the upper lobes, some air trapping on expiratory films on my review and interpretation.  Questionaires / Pulmonary Flowsheets:   ACT:      No data to display          MMRC:     No data to display          Epworth:      No data to display          Tests:   FENO:  No results found for: "NITRICOXIDE"  PFT:     No data to display          WALK:      No data to display          Imaging: Personally reviewed and as per EMR and discussion in this note No results found.  Lab Results: Personally reviewed CBC    Component Value Date/Time   WBC 9.8 10/11/2022 0843   RBC 4.30 10/11/2022 0843   HGB 14.8 10/11/2022 0843   HGB 14.4 04/18/2019 0753   HGB 14.9 04/01/2019 0928   HGB 14.7 10/12/2017 0756   HCT  42.6 10/11/2022 0843   HCT 43.3 04/01/2019 0928   HCT 41.5 10/12/2017 0756   PLT 157.0 10/11/2022 0843   PLT 129 (L) 04/18/2019 0753   PLT 132 (L) 04/01/2019 0928   MCV 99.0 10/11/2022 0843   MCV 96 04/01/2019 0928   MCV 98 10/12/2017 0756   MCH 33.7 04/18/2019 0753   MCHC 34.7 10/11/2022 0843   RDW 12.2 10/11/2022 0843   RDW 12.1 04/01/2019 0928   RDW 11.8 10/12/2017 0756   LYMPHSABS 0.9 10/11/2022 0843   LYMPHSABS 1.3 04/01/2019 0928   LYMPHSABS 1.2 10/12/2017 0756   MONOABS 0.7 10/11/2022 0843   EOSABS 0.0 10/11/2022 0843   EOSABS 0.1 04/01/2019 0928   EOSABS 0.1 10/12/2017 0756   BASOSABS 0.0 10/11/2022 0843   BASOSABS 0.0 04/01/2019 0928   BASOSABS 0.0 10/12/2017 0756    BMET    Component Value Date/Time   NA 139 06/12/2023 0847   NA 142 10/22/2020 0918   K 4.2 06/12/2023 0847   CL 104 06/12/2023 0847   CO2 26 06/12/2023 0847   GLUCOSE 122 (H) 06/12/2023 0847  BUN 18 06/12/2023 0847   BUN 17 10/22/2020 0918   CREATININE 1.04 06/12/2023 0847   CREATININE 0.94 04/18/2019 0753   CREATININE 0.97 02/03/2016 1446   CALCIUM 9.6 06/12/2023 0847   GFRNONAA 74 10/22/2020 0918   GFRNONAA >60 04/18/2019 0753   GFRNONAA 88 02/03/2016 1446   GFRAA 86 10/22/2020 0918   GFRAA >60 04/18/2019 0753   GFRAA >89 02/03/2016 1446    BNP No results found for: "BNP"  ProBNP    Component Value Date/Time   PROBNP 15.0 03/07/2023 0844    Specialty Problems       Pulmonary Problems   Seasonal allergic rhinitis due to pollen   Multiple lung nodules on CT   Pneumonitis    Allergies  Allergen Reactions  . Allopurinol     Immunization History  Administered Date(s) Administered  . Influenza,inj,Quad PF,6+ Mos 09/03/2019  . Influenza-Unspecified 09/14/2020, 09/11/2021, 08/22/2022  . PFIZER(Purple Top)SARS-COV-2 Vaccination 05/31/2020, 06/21/2020  . Td 04/01/2020  . Tdap 02/01/2010  . Zoster Recombinant(Shingrix) 01/04/2022, 04/06/2022  . Zoster, Live 11/13/2014     Past Medical History:  Diagnosis Date  . Allergy   . 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 Gout   . Heart murmur   . Lumbar strain   . Thrombocytopenia (HCC) 03/14/2016  . UTI (urinary tract infection)     Tobacco History: Social History   Tobacco Use  Smoking Status Never  Smokeless Tobacco Never   Counseling given: Not Answered   Continue to not smoke  Outpatient Encounter Medications as of 07/24/2023  Medication Sig  . azelastine (OPTIVAR) 0.05 % ophthalmic solution Place 1 drop into both eyes 2 (two) times daily.  . cholecalciferol (VITAMIN D) 1000 UNITS tablet Take 1,000 Units by mouth daily.  . colchicine 0.6 MG tablet TAKE 1 TABLET BY MOUTH EVERY OTHER DAY  . cyclobenzaprine (FLEXERIL) 5 MG tablet Take 1 tablet (5 mg total) by mouth at bedtime as needed for muscle spasms.  . diclofenac (VOLTAREN) 0.1 % ophthalmic solution 4 (four) times daily. PRN  . fluticasone (FLONASE) 50 MCG/ACT nasal spray Place 2 sprays into both nostrils daily.  . fluticasone-salmeterol (ADVAIR) 250-50 MCG/ACT AEPB Inhale 1 puff into the lungs in the morning and at bedtime.  Marland Kitchen glucosamine-chondroitin 500-400 MG tablet Take 1 tablet by mouth 3 (three) times daily.  Marland Kitchen ketoconazole (NIZORAL) 2 % cream Apply 1 application. topically 2 (two) times daily.  Marland Kitchen levocetirizine (XYZAL) 5 MG tablet Take 1 tablet (5 mg total) by mouth every evening.  . meloxicam (MOBIC) 15 MG tablet Take 1 tablet (15 mg total) by mouth daily.  . probenecid (BENEMID) 500 MG tablet TAKE 1 TABLET BY MOUTH TWICE A DAY  . Turmeric 500 MG TABS Take by mouth.   No facility-administered encounter medications on file as of 07/24/2023.     Review of Systems  Review of Systems  No chest pain with exertion.  No orthopnea or PND.  Comprehensive review of systems otherwise negative. Physical Exam  BP (!) 140/88 (BP Location: Left Arm, Patient Position: Sitting, Cuff Size: Normal)   Pulse 68   Temp 97.8 F (36.6 C)  (Oral)   Ht 6\' 3"  (1.905 m)   Wt 209 lb 12.8 oz (95.2 kg)   SpO2 98%   BMI 26.22 kg/m   Wt Readings from Last 5 Encounters:  07/24/23 209 lb 12.8 oz (95.2 kg)  06/12/23 209 lb (94.8 kg)  03/07/23 208 lb (94.3 kg)  11/29/22 205 lb (93 kg)  10/24/22 207 lb (93.9 kg)    BMI Readings from Last 5 Encounters:  07/24/23 26.22 kg/m  06/12/23 26.12 kg/m  03/07/23 26.00 kg/m  11/29/22 25.62 kg/m  10/24/22 25.87 kg/m     Physical Exam General: Sitting in chair, no acute distress Eyes: EOMI, no icterus Neck: Supple, no JVP Pulmonary: Clear, no work of breathing Cardiovascular: Warm, no edema Abdomen: Nondistended bowel sounds present MSK: No synovitis, no joint effusion Neuro: Normal gait, no weakness Psych: Normal mood, full affect   Assessment & Plan:   Dyspnea on exertion: Suspect related to abnormalities on CT scan as discussed below.  History of seasonal allergies, asthma is possible.  PFTs for further evaluation.  Trial of mid dose Advair 1 puff twice daily.  Abnormal CT scan: Scattered lobular groundglass opacities upper lobe predominant.  High suspicion for hypersensitive pneumonitis.  Will send serologic pneumonitis workup today.  PFTs as above.   Return in about 2 months (around 09/23/2023) for f/u Dr. Judeth Horn, after PFT.   Karren Burly, MD 07/24/2023   This appointment required 62 minutes of patient care (this includes precharting, chart review, review of results, face-to-face care, etc.).

## 2023-07-24 NOTE — Patient Instructions (Signed)
Nice to meet you  The prior to the CT scan is most consistent with hypersensitivity pneumonitis.  Fortunately it looks mostly like inflammation, I do not see a lot of scarring yet.  It is hard to predict how this will change over time  This is most likely a result of an exposure to some mold or something while outdoors, working outdoors etc.  To gather more information I recommend pulmonary function test, this can be scheduled next available at your convenience  To help you feel better, I recommend an inhaler, use Advair 1 puff twice a day every day.  Rinse your mouth out with water after every use.  If this is too expensive please to me a message I will look for more cost effective solution.  Let see if this helps with some of the shortness of breath and congestion in the mornings etc.  Will also get lab work today for further evaluation of your symptoms  Return to clinic in 2 months or sooner as needed with Dr. Judeth Horn, after pulmonary function test

## 2023-07-30 LAB — HYPERSENSITIVITY PNEUMONITIS
A. Pullulans Abs: NEGATIVE
A.Fumigatus #1 Abs: NEGATIVE
Micropolyspora faeni, IgG: NEGATIVE
Pigeon Serum Abs: NEGATIVE
Thermoact. Saccharii: NEGATIVE
Thermoactinomyces vulgaris, IgG: NEGATIVE

## 2023-09-11 LAB — HM COLONOSCOPY

## 2023-10-18 LAB — PSA: PSA: 4.3

## 2023-10-29 ENCOUNTER — Ambulatory Visit: Payer: 59 | Admitting: Pulmonary Disease

## 2023-10-29 ENCOUNTER — Encounter: Payer: Self-pay | Admitting: Pulmonary Disease

## 2023-10-29 VITALS — BP 132/72 | HR 70 | Ht 76.0 in | Wt 215.0 lb

## 2023-10-29 DIAGNOSIS — J849 Interstitial pulmonary disease, unspecified: Secondary | ICD-10-CM | POA: Diagnosis not present

## 2023-10-29 DIAGNOSIS — J984 Other disorders of lung: Secondary | ICD-10-CM

## 2023-10-29 DIAGNOSIS — J301 Allergic rhinitis due to pollen: Secondary | ICD-10-CM

## 2023-10-29 LAB — PULMONARY FUNCTION TEST
DL/VA % pred: 114 %
DL/VA: 4.65 ml/min/mmHg/L
DLCO cor % pred: 80 %
DLCO cor: 26.17 ml/min/mmHg
DLCO unc % pred: 80 %
DLCO unc: 26.17 ml/min/mmHg
FEF 25-75 Post: 3.42 L/s
FEF 25-75 Pre: 2.67 L/s
FEF2575-%Change-Post: 28 %
FEF2575-%Pred-Post: 100 %
FEF2575-%Pred-Pre: 78 %
FEV1-%Change-Post: 7 %
FEV1-%Pred-Post: 74 %
FEV1-%Pred-Pre: 69 %
FEV1-Post: 3.22 L
FEV1-Pre: 3 L
FEV1FVC-%Change-Post: 2 %
FEV1FVC-%Pred-Pre: 106 %
FEV6-%Change-Post: 6 %
FEV6-%Pred-Post: 72 %
FEV6-%Pred-Pre: 68 %
FEV6-Post: 3.96 L
FEV6-Pre: 3.74 L
FEV6FVC-%Pred-Post: 104 %
FEV6FVC-%Pred-Pre: 104 %
FVC-%Change-Post: 5 %
FVC-%Pred-Post: 69 %
FVC-%Pred-Pre: 65 %
FVC-Post: 3.96 L
FVC-Pre: 3.77 L
Post FEV1/FVC ratio: 81 %
Post FEV6/FVC ratio: 100 %
Pre FEV1/FVC ratio: 80 %
Pre FEV6/FVC Ratio: 100 %
RV % pred: 120 %
RV: 3.18 L
TLC % pred: 89 %
TLC: 7.32 L

## 2023-10-29 MED ORDER — TRELEGY ELLIPTA 200-62.5-25 MCG/ACT IN AEPB: 1.0000 | INHALATION_SPRAY | Freq: Every day | RESPIRATORY_TRACT | 6 refills | Status: DC

## 2023-10-29 NOTE — Progress Notes (Signed)
@Patient  ID: Brendan Short, male    DOB: Mar 10, 1961, 62 y.o.   MRN: [id number]  Chief Complaint  Patient presents with   Follow-up    PFT F/U 12/16, SOB while being active, mucus in am after waking up (clear/yellow)    Referring provider: Etta Grandchild, MD  HPI:   62 y.o. man whom we are seeing for evaluation of abnormal CT scan.  Multiple notes from PCP reviewed.    Returns for routine follow-up.  Prescribed Advair mid dose at last visit, changed Wixela per insurance preference.  Has helped some.  Less short of breath walking around during the day.  Main complaint today is cough.  Productive in the morning.  Occasional nasal congestion could be contributing.  But then throughout the day after coughing in the morning cough is not a major problem.  PFTs performed today for interpretation below, no fixed obstruction but showed air trapping likely consistent with asthma.  Role and rationale for escalating inhalers based on symptoms and based on PFTs discussed with patient.  HPI at initial visit: Overall patient feels relatively well.  Does describe some dyspnea on exertion for about 1 year.  Little more labored doing activities compared to prior.  Sounds like a CT of the coronaries was obtained just for routine surveillance.  Finding scribe below.  On questioning he did not describe shortness of breath.  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  No birds.  Sprayed chemicals but no pain or other solvents.  No other pets.  Does have at long and seasonal allergy.  Just asthma is possible they often go hand-in-hand.  Chart  As part of workup CT coronary scan was obtained 04/2023 that showed mild subpleural reticular densities in both lungs, low coronary calcium score.  This prompted a high-res CT scan that demonstrated mild groundglass nodular-like opacities predominantly in the upper lobes, some air trapping on expiratory films on my review and interpretation.  Questionaires /  Pulmonary Flowsheets:   ACT:      No data to display          MMRC:     No data to display          Epworth:      No data to display          Tests:   FENO:  No results found for: "NITRICOXIDE"  PFT:    Latest Ref Rng & Units 10/29/2023    8:50 AM  PFT Results  FVC-Pre L 3.77  P  FVC-Predicted Pre % 65  P  FVC-Post L 3.96  P  FVC-Predicted Post % 69  P  Pre FEV1/FVC % % 80  P  Post FEV1/FCV % % 81  P  FEV1-Pre L 3.00  P  FEV1-Predicted Pre % 69  P  FEV1-Post L 3.22  P  DLCO uncorrected ml/min/mmHg 26.17  P  DLCO UNC% % 80  P  DLCO corrected ml/min/mmHg 26.17  P  DLCO COR %Predicted % 80  P  DLVA Predicted % 114  P  TLC L 7.32  P  TLC % Predicted % 89  P  RV % Predicted % 120  P    P Preliminary result  Personally reviewed interpreted as spirometry just of moderate restriction versus air trapping.  No significant bronchodilator response.  Lung volumes consistent with air trapping otherwise normal, no restriction.  DLCO within normal limits.  WALK:      No data  to display          Imaging: Personally reviewed and as per EMR and discussion in this note No results found.  Lab Results: Personally reviewed CBC    Component Value Date/Time   WBC 9.8 10/11/2022 0843   RBC 4.30 10/11/2022 0843   HGB 14.8 10/11/2022 0843   HGB 14.4 04/18/2019 0753   HGB 14.9 04/01/2019 0928   HGB 14.7 10/12/2017 0756   HCT 42.6 10/11/2022 0843   HCT 43.3 04/01/2019 0928   HCT 41.5 10/12/2017 0756   PLT 157.0 10/11/2022 0843   PLT 129 (L) 04/18/2019 0753   PLT 132 (L) 04/01/2019 0928   MCV 99.0 10/11/2022 0843   MCV 96 04/01/2019 0928   MCV 98 10/12/2017 0756   MCH 33.7 04/18/2019 0753   MCHC 34.7 10/11/2022 0843   RDW 12.2 10/11/2022 0843   RDW 12.1 04/01/2019 0928   RDW 11.8 10/12/2017 0756   LYMPHSABS 0.9 10/11/2022 0843   LYMPHSABS 1.3 04/01/2019 0928   LYMPHSABS 1.2 10/12/2017 0756   MONOABS 0.7 10/11/2022 0843   EOSABS 0.0 10/11/2022 0843    EOSABS 0.1 04/01/2019 0928   EOSABS 0.1 10/12/2017 0756   BASOSABS 0.0 10/11/2022 0843   BASOSABS 0.0 04/01/2019 0928   BASOSABS 0.0 10/12/2017 0756    BMET    Component Value Date/Time   NA 139 06/12/2023 0847   NA 142 10/22/2020 0918   K 4.2 06/12/2023 0847   CL 104 06/12/2023 0847   CO2 26 06/12/2023 0847   GLUCOSE 122 (H) 06/12/2023 0847   BUN 18 06/12/2023 0847   BUN 17 10/22/2020 0918   CREATININE 1.04 06/12/2023 0847   CREATININE 0.94 04/18/2019 0753   CREATININE 0.97 02/03/2016 1446   CALCIUM 9.6 06/12/2023 0847   GFRNONAA 74 10/22/2020 0918   GFRNONAA >60 04/18/2019 0753   GFRNONAA 88 02/03/2016 1446   GFRAA 86 10/22/2020 0918   GFRAA >60 04/18/2019 0753   GFRAA >89 02/03/2016 1446    BNP No results found for: "BNP"  ProBNP    Component Value Date/Time   PROBNP 15.0 03/07/2023 0844    Specialty Problems       Pulmonary Problems   Seasonal allergic rhinitis due to pollen   Multiple lung nodules on CT   Pneumonitis    Allergies  Allergen Reactions   Allopurinol     Immunization History  Administered Date(s) Administered   Influenza,inj,Quad PF,6+ Mos 09/03/2019   Influenza-Unspecified 09/14/2020, 09/11/2021, 08/22/2022, 08/14/2023   PFIZER(Purple Top)SARS-COV-2 Vaccination 05/31/2020, 06/21/2020   Td 04/01/2020   Tdap 02/01/2010   Zoster Recombinant(Shingrix) 01/04/2022, 04/06/2022   Zoster, Live 11/13/2014    Past Medical History:  Diagnosis Date   Allergy    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   colchicine 0.6  MG tablet TAKE 1 TABLET BY MOUTH EVERY OTHER DAY   cyclobenzaprine (FLEXERIL) 5 MG tablet Take 1 tablet (5 mg total) by mouth at bedtime as needed for muscle spasms. (Patient taking differently: Take 5 mg by mouth at bedtime  as needed for muscle spasms. As needed)   diclofenac (VOLTAREN) 0.1 % ophthalmic solution 4 (four) times daily. PRN   Fluticasone-Umeclidin-Vilant (TRELEGY ELLIPTA) 200-62.5-25 MCG/ACT AEPB Inhale 1 puff into the lungs daily.   glucosamine-chondroitin 500-400 MG tablet Take 1 tablet by mouth 3 (three) times daily.   ketoconazole (NIZORAL) 2 % cream Apply 1 application. topically 2 (two) times daily.   levocetirizine (XYZAL) 5 MG tablet Take 1 tablet (5 mg total) by mouth every evening.   meloxicam (MOBIC) 15 MG tablet Take 1 tablet (15 mg total) by mouth daily.   probenecid (BENEMID) 500 MG tablet TAKE 1 TABLET BY MOUTH TWICE A DAY   Turmeric 500 MG TABS Take by mouth.   [DISCONTINUED] fluticasone-salmeterol (ADVAIR) 250-50 MCG/ACT AEPB Inhale 1 puff into the lungs in the morning and at bedtime.   azelastine (OPTIVAR) 0.05 % ophthalmic solution Place 1 drop into both eyes 2 (two) times daily. (Patient not taking: Reported on 10/29/2023)   fluticasone (FLONASE) 50 MCG/ACT nasal spray Place 2 sprays into both nostrils daily. (Patient not taking: Reported on 10/29/2023)   No facility-administered encounter medications on file as of 10/29/2023.     Review of Systems  Review of Systems  N/a Physical Exam  BP 132/72   Pulse 70   Ht 6\' 4"  (1.93 m)   Wt 215 lb (97.5 kg)   SpO2 94%   BMI 26.17 kg/m   Wt Readings from Last 5 Encounters:  10/29/23 215 lb (97.5 kg)  07/24/23 209 lb 12.8 oz (95.2 kg)  06/12/23 209 lb (94.8 kg)  03/07/23 208 lb (94.3 kg)  11/29/22 205 lb (93 kg)    BMI Readings from Last 5 Encounters:  10/29/23 26.17 kg/m  07/24/23 26.22 kg/m  06/12/23 26.12 kg/m  03/07/23 26.00 kg/m  11/29/22 25.62 kg/m     Physical Exam General: Sitting  in chair, no acute distress Eyes: EOMI, no icterus Neck: Supple, no JVP Pulmonary: Clear, no work of breathing Cardiovascular: Warm, no edema Abdomen: Nondistended bowel sounds present MSK: No synovitis, no joint effusion Neuro: Normal gait, no weakness Psych: Normal mood, full affect   Assessment & Plan:   Dyspnea on exertion: Suspect related to abnormalities on CT scan as discussed below.  History of seasonal allergies, asthma is possible.  PFTs consistent with air trapping no fixed obstruction otherwise normal.  Likely related to asthma.  Escalate therapies as below.  Moderate persistent asthma: History of atopic symptoms.  Onset of dyspnea on exertion.  Improved with Advair/Wixela mid dose.  PFTs with air trapping and ongoing cough.  While symptoms overall improved, based on air trapping and residual symptoms escalate to high-dose Trelegy.  New prescription today.  If no real improvement or too expensive we will consider high-dose Wixela see if this provides any benefit compared to mid dose Wixela previously prescribed.  Abnormal CT scan: Scattered lobular groundglass opacities upper lobe predominant.  High suspicion for hypersensitive pneumonitis.  CRP, sed rate within normal limits.  HSP panel negative.  PFTs within normal limits no restriction.  Interval CT scans for surveillance reasonable with no further workup or treatment recommended.   Return in about 6 months (around 04/28/2024) for f/u Dr. Judeth Horn.   Karren Burly, MD 10/29/2023

## 2023-10-29 NOTE — Patient Instructions (Signed)
Full PFT performed today. °

## 2023-10-29 NOTE — Progress Notes (Signed)
Full PFT performed today. °

## 2023-10-29 NOTE — Patient Instructions (Signed)
Nice  to see you again  Use Trelegy 1 puff once a day, rinse your mouth out with water after every use.  This adds a third medicine to help get air out of the lungs.  Based on your pulmonary function test this should help.  Please let me know if too expensive and I can look for an alternative.  If the Trelegy does not help we can go back to [place], would plan on sending in a new prescription for the higher dose to see if helps your cough more than your current dose.  Return to clinic in 6 months or sooner as needed with Dr. Judeth Horn

## 2023-11-01 ENCOUNTER — Other Ambulatory Visit: Payer: Self-pay | Admitting: Urology

## 2023-11-01 DIAGNOSIS — R972 Elevated prostate specific antigen [PSA]: Secondary | ICD-10-CM

## 2023-11-21 ENCOUNTER — Other Ambulatory Visit: Payer: Self-pay | Admitting: Internal Medicine

## 2023-11-21 DIAGNOSIS — M1A09X0 Idiopathic chronic gout, multiple sites, without tophus (tophi): Secondary | ICD-10-CM

## 2023-12-13 ENCOUNTER — Ambulatory Visit: Payer: 59 | Admitting: Internal Medicine

## 2023-12-17 ENCOUNTER — Ambulatory Visit
Admission: RE | Admit: 2023-12-17 | Discharge: 2023-12-17 | Disposition: A | Discharge status: Home / Self Care | Payer: 59 | Source: Ambulatory Visit | Attending: Urology | Admitting: Urology

## 2023-12-17 DIAGNOSIS — R972 Elevated prostate specific antigen [PSA]: Secondary | ICD-10-CM

## 2023-12-17 MED ORDER — GADOPICLENOL 0.5 MMOL/ML IV SOLN
10.0000 mL | Freq: Once | INTRAVENOUS | Status: AC | PRN
  Administered 2023-12-17: 10 mL via INTRAVENOUS

## 2023-12-19 ENCOUNTER — Encounter: Payer: Self-pay | Admitting: Internal Medicine

## 2023-12-19 ENCOUNTER — Ambulatory Visit: Payer: 59 | Admitting: Internal Medicine

## 2023-12-19 VITALS — BP 126/82 | HR 60 | Temp 98.0°F | Ht 76.0 in | Wt 208.1 lb

## 2023-12-19 DIAGNOSIS — R001 Bradycardia, unspecified: Secondary | ICD-10-CM

## 2023-12-19 DIAGNOSIS — R0609 Other forms of dyspnea: Secondary | ICD-10-CM | POA: Diagnosis not present

## 2023-12-19 DIAGNOSIS — R972 Elevated prostate specific antigen [PSA]: Secondary | ICD-10-CM | POA: Diagnosis not present

## 2023-12-19 DIAGNOSIS — I10 Essential (primary) hypertension: Secondary | ICD-10-CM

## 2023-12-19 DIAGNOSIS — M1A09X0 Idiopathic chronic gout, multiple sites, without tophus (tophi): Secondary | ICD-10-CM

## 2023-12-19 DIAGNOSIS — D510 Vitamin B12 deficiency anemia due to intrinsic factor deficiency: Secondary | ICD-10-CM | POA: Diagnosis not present

## 2023-12-19 DIAGNOSIS — E785 Hyperlipidemia, unspecified: Secondary | ICD-10-CM

## 2023-12-19 DIAGNOSIS — R739 Hyperglycemia, unspecified: Secondary | ICD-10-CM

## 2023-12-19 LAB — TROPONIN I (HIGH SENSITIVITY): High Sens Troponin I: 6 ng/L (ref 2–17)

## 2023-12-19 LAB — CBC WITH DIFFERENTIAL/PLATELET
Basophils Absolute: 0 10*3/uL (ref 0.0–0.1)
Basophils Relative: 0.3 % (ref 0.0–3.0)
Eosinophils Absolute: 0.1 10*3/uL (ref 0.0–0.7)
Eosinophils Relative: 1.1 % (ref 0.0–5.0)
HCT: 42.9 % (ref 39.0–52.0)
Hemoglobin: 14.7 g/dL (ref 13.0–17.0)
Lymphocytes Relative: 27.9 % (ref 12.0–46.0)
Lymphs Abs: 1.3 10*3/uL (ref 0.7–4.0)
MCHC: 34.3 g/dL (ref 30.0–36.0)
MCV: 100.6 fL — ABNORMAL HIGH (ref 78.0–100.0)
Monocytes Absolute: 0.5 10*3/uL (ref 0.1–1.0)
Monocytes Relative: 9.8 % (ref 3.0–12.0)
Neutro Abs: 2.9 10*3/uL (ref 1.4–7.7)
Neutrophils Relative %: 60.9 % (ref 43.0–77.0)
Platelets: 137 10*3/uL — ABNORMAL LOW (ref 150.0–400.0)
RBC: 4.27 Mil/uL (ref 4.22–5.81)
RDW: 12.3 % (ref 11.5–15.5)
WBC: 4.8 10*3/uL (ref 4.0–10.5)

## 2023-12-19 LAB — URINALYSIS, ROUTINE W REFLEX MICROSCOPIC
Bilirubin Urine: NEGATIVE
Hgb urine dipstick: NEGATIVE
Ketones, ur: NEGATIVE
Leukocytes,Ua: NEGATIVE
Nitrite: NEGATIVE
Specific Gravity, Urine: 1.015 (ref 1.000–1.030)
Total Protein, Urine: NEGATIVE
Urine Glucose: NEGATIVE
Urobilinogen, UA: 0.2 (ref 0.0–1.0)
pH: 6 (ref 5.0–8.0)

## 2023-12-19 LAB — LIPID PANEL
Cholesterol: 193 mg/dL (ref 0–200)
HDL: 44.8 mg/dL (ref >39.00)
LDL Cholesterol: 116 mg/dL — ABNORMAL HIGH (ref 0–99)
NonHDL: 147.72
Total CHOL/HDL Ratio: 4
Triglycerides: 159 mg/dL — ABNORMAL HIGH (ref 0.0–149.0)
VLDL: 31.8 mg/dL (ref 0.0–40.0)

## 2023-12-19 LAB — HEPATIC FUNCTION PANEL
ALT: 22 U/L (ref 0–53)
AST: 18 U/L (ref 0–37)
Albumin: 4.3 g/dL (ref 3.5–5.2)
Alkaline Phosphatase: 45 U/L (ref 39–117)
Bilirubin, Direct: 0.1 mg/dL (ref 0.0–0.3)
Total Bilirubin: 0.8 mg/dL (ref 0.2–1.2)
Total Protein: 6.8 g/dL (ref 6.0–8.3)

## 2023-12-19 LAB — BASIC METABOLIC PANEL
BUN: 19 mg/dL (ref 6–23)
CO2: 30 meq/L (ref 19–32)
Calcium: 9.1 mg/dL (ref 8.4–10.5)
Chloride: 101 meq/L (ref 96–112)
Creatinine, Ser: 1 mg/dL (ref 0.40–1.50)
GFR: 79.96 mL/min (ref >60.00)
Glucose, Bld: 112 mg/dL — ABNORMAL HIGH (ref 70–99)
Potassium: 4.3 meq/L (ref 3.5–5.1)
Sodium: 141 meq/L (ref 135–145)

## 2023-12-19 LAB — TSH: TSH: 2.8 u[IU]/mL (ref 0.35–5.50)

## 2023-12-19 LAB — HEMOGLOBIN A1C: Hgb A1c MFr Bld: 5.6 % (ref 4.6–6.5)

## 2023-12-19 LAB — FOLATE: Folate: 14.9 ng/mL (ref >5.9)

## 2023-12-19 LAB — VITAMIN B12: Vitamin B-12: 772 pg/mL (ref 211–911)

## 2023-12-19 LAB — URIC ACID: Uric Acid, Serum: 5.7 mg/dL (ref 4.0–7.8)

## 2023-12-19 LAB — BRAIN NATRIURETIC PEPTIDE: Pro B Natriuretic peptide (BNP): 13 pg/mL (ref 0.0–100.0)

## 2023-12-19 MED ORDER — COLCHICINE 0.6 MG PO TABS
0.6000 mg | ORAL_TABLET | ORAL | 0 refills | Status: DC
Start: 2023-12-19 — End: 2024-05-20

## 2023-12-19 MED ORDER — PROBENECID 500 MG PO TABS: 500.0000 mg | ORAL_TABLET | Freq: Two times a day (BID) | ORAL | 2 refills | Status: DC

## 2023-12-19 NOTE — Patient Instructions (Signed)
 Bradycardia, Adult Bradycardia is a slower-than-normal heartbeat. A normal resting heart rate for an adult ranges from 60 to 100 beats per minute. With bradycardia, the resting heart rate is less than 60 beats per minute. Bradycardia can prevent enough oxygen  from reaching certain areas of your body when you are active. It can be serious if it keeps enough oxygen  from reaching your brain and other parts of your body. Bradycardia is not a problem for everyone. For some healthy adults, a slow resting heart rate is normal. What are the causes? This condition may be caused by: A problem with the heart, including: A problem with the heart's electrical system, such as a heart block. With a heart block, electrical signals between the chambers of the heart are partially or completely blocked, so they are not able to work as they should. A problem with the heart's natural pacemaker (sinus node). Heart disease. A heart attack. Heart damage. Lyme disease. A heart infection. A heart condition that is present at birth (congenital heart defect). Certain medicines that treat heart conditions. Certain conditions, such as hypothyroidism and obstructive sleep apnea. Problems with the balance of chemicals and other substances, like potassium, in the blood. Trauma. Radiation therapy. What increases the risk? You are more likely to develop this condition if you: Are age 30 or older. Have high blood pressure (hypertension), high cholesterol (hyperlipidemia), or diabetes. Drink heavily, use tobacco or nicotine products, or use drugs. What are the signs or symptoms? Symptoms of this condition include: Light-headedness. Feeling faint or fainting. Fatigue and weakness. Trouble with activity or exercise. Shortness of breath. Chest pain (angina). Drowsiness. Confusion. Dizziness. How is this diagnosed? This condition may be diagnosed based on: Your symptoms. Your medical history. A physical exam. During  the exam, your health care provider will listen to your heartbeat and check your pulse. To confirm the diagnosis, your health care provider may order tests, such as: Blood tests. An electrocardiogram (ECG). This test records the heart's electrical activity. The test can show how fast your heart is beating and whether the heartbeat is steady. A test in which you wear a portable device (event recorder or Holter monitor) to record your heart's electrical activity while you go about your day. An exercise test. How is this treated? Treatment for this condition depends on the cause of the condition and how severe your symptoms are. Treatment may involve: Treatment of the underlying condition. Changing your medicines or how much medicine you take. Having a small, battery-operated device called a pacemaker implanted under the skin. When bradycardia occurs, this device can be used to increase your heart rate and help your heart beat in a regular rhythm. Follow these instructions at home: Lifestyle Manage any health conditions that contribute to bradycardia as told by your health care provider. Follow a heart-healthy diet. A nutrition specialist (dietitian) can help educate you about healthy food options and changes. Follow an exercise program that is approved by your health care provider. Maintain a healthy weight. Try to reduce or manage your stress, such as with yoga or meditation. If you need help reducing stress, ask your health care provider. Do not use any products that contain nicotine or tobacco. These products include cigarettes, chewing tobacco, and vaping devices, such as e-cigarettes. If you need help quitting, ask your health care provider. Do not use illegal drugs. Alcohol  use If you drink alcohol : Limit how much you have to: 0-1 drink a day for women who are not pregnant. 0-2 drinks a day  for men. Know how much alcohol  is in a drink. In the [place] one drink equals one 12 oz bottle of  beer (355 mL), one 5 oz glass of wine (148 mL), or one 1 oz glass of hard liquor (44 mL). General instructions Take over-the-counter and prescription medicines only as told by your health care provider. Keep all follow-up visits. This is important. How is this prevented? In some cases, bradycardia may be prevented by: Treating underlying medical problems. Stopping behaviors or medicines that can trigger the condition. Contact a health care provider if: You feel light-headed or dizzy. You almost faint. You feel weak or are easily fatigued during physical activity. You experience confusion or have memory problems. Get help right away if: You faint. You have chest pains or an irregular heartbeat (palpitations). You have trouble breathing. These symptoms may represent a serious problem that is an emergency. Do not wait to see if the symptoms will go away. Get medical help right away. Call your local emergency services (911 in the [place] Do not drive yourself to the hospital. Summary Bradycardia is a slower-than-normal heartbeat. With bradycardia, the resting heart rate is less than 60 beats per minute. Treatment for this condition depends on the cause. Manage any health conditions that contribute to bradycardia as told by your health care provider. Do not use any products that contain nicotine or tobacco. These products include cigarettes, chewing tobacco, and vaping devices, such as e-cigarettes. Keep all follow-up visits. This is important. This information is not intended to replace advice given to you by your health care provider. Make sure you discuss any questions you have with your health care provider. Document Revised: 02/20/2021 Document Reviewed: 02/20/2021 Elsevier Patient Education  2024 [employer].

## 2023-12-19 NOTE — Progress Notes (Signed)
 Subjective:  Patient ID: Brendan Short, male    DOB: November 03, 1961  Age: 63 y.o. MRN: [id number]  CC: Hypertension and Hyperlipidemia   HPI Brendan Short presents for f/up ----  Discussed the use of AI scribe software for clinical note transcription with the patient, who gave verbal consent to proceed.  History of Present Illness   Brendan Short is a 63 year old male with asthma who presents with shortness of breath on exertion.  He experiences shortness of breath, particularly during exertion such as walking uphill or performing squats. This has been ongoing for a couple of years and became more noticeable after his retirement in 2023. The shortness of breath is manageable but more pronounced with physical exertion. No chest pain, productive cough, or hemoptysis. Occasional lightheadedness occurs when standing up quickly, but it is infrequent.  He has a history of asthma, diagnosed after a pulmonary test. Initially, he tried one inhaler with some relief and is currently using Trelegy inhaler once every morning, which he feels is helping. He experiences significant coughing in the mornings, which improves as the day progresses.  In November, his PSA levels were elevated, prompting an MRI of the prostate area. The results were discussed with his new urologist, Dr. Selma, and were reported as good, though not definitive. He has a family history of prostate issues and plans to follow up in a couple of months for retesting.  He receives a B12 shot monthly from a nurse friend and also takes a daily B12 supplement pill. He is not currently on the cholesterol medication.  He is a nonsmoker and nondrinker, though he occasionally has a few beers or a drink at social events. His family history includes his mother having died from an aortic aneurysm and his maternal grandmother from heart failure. His father and paternal grandmother had no heart issues.       Outpatient Medications Prior to Visit   Medication Sig Dispense Refill   azelastine  (OPTIVAR ) 0.05 % ophthalmic solution Place 1 drop into both eyes 2 (two) times daily. 6 mL 12   cholecalciferol (VITAMIN D ) 1000 UNITS tablet Take 1,000 Units by mouth daily.     cyclobenzaprine  (FLEXERIL ) 5 MG tablet Take 1 tablet (5 mg total) by mouth at bedtime as needed for muscle spasms. (Patient taking differently: Take 5 mg by mouth at bedtime as needed for muscle spasms. As needed) 40 tablet 2   diclofenac  (VOLTAREN ) 0.1 % ophthalmic solution 4 (four) times daily. PRN     fluticasone  (FLONASE ) 50 MCG/ACT nasal spray Place 2 sprays into both nostrils daily. 48 g 1   Fluticasone -Umeclidin-Vilant (TRELEGY ELLIPTA ) 200-62.5-25 MCG/ACT AEPB Inhale 1 puff into the lungs daily. 60 each 6   glucosamine-chondroitin 500-400 MG tablet Take 1 tablet by mouth 3 (three) times daily.     ketoconazole  (NIZORAL ) 2 % cream Apply 1 application. topically 2 (two) times daily. 60 g 2   levocetirizine (XYZAL ) 5 MG tablet Take 1 tablet (5 mg total) by mouth every evening. (Patient taking differently: Take 5 mg by mouth as needed for allergies.) 90 tablet 1   Turmeric 500 MG TABS Take by mouth.     colchicine  0.6 MG tablet TAKE 1 TABLET BY MOUTH EVERY OTHER DAY 45 tablet 0   probenecid  (BENEMID ) 500 MG tablet TAKE 1 TABLET BY MOUTH TWICE A DAY 60 tablet 2   meloxicam  (MOBIC ) 15 MG tablet Take 1 tablet (15 mg total) by mouth daily. (Patient not taking:  Reported on 12/19/2023) 90 tablet 0   No facility-administered medications prior to visit.    ROS Review of Systems  Constitutional:  Negative for appetite change, diaphoresis, fatigue and unexpected weight change.  HENT: Negative.  Negative for sore throat and trouble swallowing.   Eyes: Negative.   Respiratory:  Positive for shortness of breath (DOE). Negative for cough, chest tightness and wheezing.   Cardiovascular:  Negative for chest pain, palpitations and leg swelling.  Gastrointestinal:  Negative for  abdominal pain, constipation, diarrhea, nausea and vomiting.  Endocrine: Negative.   Genitourinary: Negative.  Negative for difficulty urinating.  Musculoskeletal:  Negative for arthralgias, back pain and myalgias.  Skin: Negative.   Neurological:  Negative for dizziness, syncope, weakness, light-headedness and numbness.  Hematological:  Negative for adenopathy. Does not bruise/bleed easily.  Psychiatric/Behavioral: Negative.      Objective:  BP 126/82   Pulse 60   Temp 98 F (36.7 C) (Temporal)   Ht 6' 4 (1.93 m)   Wt 208 lb 2 oz (94.4 kg)   SpO2 98%   BMI 25.33 kg/m   BP Readings from Last 3 Encounters:  12/19/23 126/82  10/29/23 132/72  07/24/23 (!) 140/88    Wt Readings from Last 3 Encounters:  12/19/23 208 lb 2 oz (94.4 kg)  10/29/23 215 lb (97.5 kg)  07/24/23 209 lb 12.8 oz (95.2 kg)    Physical Exam Vitals reviewed.  Constitutional:      Appearance: Normal appearance.  HENT:     Mouth/Throat:     Mouth: Mucous membranes are moist.  Eyes:     General: No scleral icterus.    Conjunctiva/sclera: Conjunctivae normal.  Cardiovascular:     Rate and Rhythm: Regular rhythm. Bradycardia present.     Pulses: Normal pulses.     Heart sounds: No murmur heard.    No friction rub. No gallop.     Comments: EKG- SB, 54 bpm No LVH, Q waves, or ST/T wave changes  Pulmonary:     Effort: Pulmonary effort is normal.     Breath sounds: No stridor. No wheezing, rhonchi or rales.  Abdominal:     General: Abdomen is flat.     Palpations: There is no mass.     Tenderness: There is no abdominal tenderness. There is no guarding.     Hernia: No hernia is present.  Musculoskeletal:        General: Normal range of motion.     Cervical back: Neck supple.     Right lower leg: No edema.     Left lower leg: No edema.  Skin:    General: Skin is warm and dry.  Neurological:     General: No focal deficit present.     Mental Status: He is alert. Mental status is at baseline.   Psychiatric:        Mood and Affect: Mood normal.        Behavior: Behavior normal.     Lab Results  Component Value Date   WBC 4.8 12/19/2023   HGB 14.7 12/19/2023   HCT 42.9 12/19/2023   PLT 137.0 (L) 12/19/2023   GLUCOSE 112 (H) 12/19/2023   CHOL 193 12/19/2023   TRIG 159.0 (H) 12/19/2023   HDL 44.80 12/19/2023   LDLCALC 116 (H) 12/19/2023   ALT 22 12/19/2023   AST 18 12/19/2023   NA 141 12/19/2023   K 4.3 12/19/2023   CL 101 12/19/2023   CREATININE 1.00 12/19/2023   BUN 19 12/19/2023  CO2 30 12/19/2023   TSH 2.80 12/19/2023   PSA 4.3 10/18/2023   HGBA1C 5.6 12/19/2023    MR PROSTATE W WO CONTRAST Result Date: 12/18/2023 CLINICAL DATA:  Elevated PSA level.  R97.20 EXAM: MR PROSTATE WITHOUT AND WITH CONTRAST TECHNIQUE: Multiplanar multisequence MRI images were obtained of the pelvis centered about the prostate. Pre and post contrast images were obtained. CONTRAST:  10 cc V[employer]way  COMPARISON:  None Available. FINDINGS: Prostate: Encapsulated nodularity in the transition zone compatible with benign prostatic hypertrophy. No focal lesion of intermediate or higher suspicion for prostate cancer is identified. Volume: Ellipsoid volume calculation: The prostate gland measures 5.5 by 4.4 by 4.8 cm (volume = 61 cm^3). Transcapsular spread: Absent Seminal vesicle involvement: Absent Neurovascular bundle involvement: Absent Pelvic adenopathy: Absent Bone metastasis: Absent Other findings: Anterior pelvic hernia mesh. Mild diverticulosis at the junction of the descending and sigmoid colon. Degenerative hip arthropathy, left greater than right. IMPRESSION: 1. No focal lesion of intermediate or higher suspicion for prostate cancer is identified. 2. Prostatomegaly and benign prostatic hypertrophy. 3. Mild diverticulosis at the junction of the descending and sigmoid colon. 4. Degenerative hip arthropathy, left greater than right. Electronically Signed   By: Ryan Salvage M.D.   On: 12/18/2023  09:01    Assessment & Plan:  Hyperglycemia -     Hemoglobin A1c; Future -     Basic metabolic panel; Future  Vitamin B12 deficiency anemia due to intrinsic factor deficiency -     CBC with Differential/Platelet; Future -     Vitamin B12; Future -     Folate; Future  Idiopathic chronic gout of multiple sites without tophus- He has achieved his uric acid goal. -     Probenecid ; Take 1 tablet (500 mg total) by mouth 2 (two) times daily.  Dispense: 60 tablet; Refill: 2 -     Colchicine ; Take 1 tablet (0.6 mg total) by mouth every other day.  Dispense: 45 tablet; Refill: 0 -     Uric acid; Future -     Urinalysis, Routine w reflex microscopic; Future -     Basic metabolic panel; Future  Primary hypertension -     TSH; Future -     Basic metabolic panel; Future -     CT CARDIAC SCORING ([facility] LOCATIONS ONLY); Future  DOE (dyspnea on exertion)- Will risk stratify with a CCS. -     CBC with Differential/Platelet; Future -     Troponin I (High Sensitivity); Future -     Brain natriuretic peptide; Future -     EKG 12-Lead -     CT CARDIAC SCORING ([facility] LOCATIONS ONLY); Future  Dyslipidemia, goal LDL below 100 -     Lipid panel; Future -     Hepatic function panel; Future -     TSH; Future -     CT CARDIAC SCORING ([facility] LOCATIONS ONLY); Future  Bradycardia on ECG  Other orders -     EKG     Follow-up: Return in about 6 months (around 06/17/2024).  Debby Molt, MD

## 2023-12-20 ENCOUNTER — Encounter: Payer: Self-pay | Admitting: Internal Medicine

## 2023-12-20 ENCOUNTER — Other Ambulatory Visit: Payer: Self-pay | Admitting: Internal Medicine

## 2023-12-20 DIAGNOSIS — E785 Hyperlipidemia, unspecified: Secondary | ICD-10-CM

## 2023-12-20 DIAGNOSIS — I10 Essential (primary) hypertension: Secondary | ICD-10-CM

## 2023-12-20 DIAGNOSIS — R0609 Other forms of dyspnea: Secondary | ICD-10-CM

## 2024-01-14 ENCOUNTER — Other Ambulatory Visit: Payer: 59

## 2024-01-14 ENCOUNTER — Ambulatory Visit
Admission: RE | Admit: 2024-01-14 | Discharge: 2024-01-14 | Disposition: A | Discharge status: Home / Self Care | Payer: 59 | Source: Ambulatory Visit | Attending: Internal Medicine | Admitting: Internal Medicine

## 2024-01-14 DIAGNOSIS — E785 Hyperlipidemia, unspecified: Secondary | ICD-10-CM

## 2024-01-14 DIAGNOSIS — R0609 Other forms of dyspnea: Secondary | ICD-10-CM

## 2024-01-14 DIAGNOSIS — I10 Essential (primary) hypertension: Secondary | ICD-10-CM

## 2024-01-16 ENCOUNTER — Encounter: Payer: Self-pay | Admitting: Internal Medicine

## 2024-02-18 ENCOUNTER — Telehealth: Payer: Self-pay | Admitting: Internal Medicine

## 2024-02-18 NOTE — Telephone Encounter (Unsigned)
 Copied from CRM [id number]. Topic: General - Other >> Feb 18, 2024  8:27 AM Truddie Crumble wrote: Reason for CRM: patient called stating he would like Flonase called into [facility] because he is experiencing pollen issues

## 2024-02-22 ENCOUNTER — Other Ambulatory Visit: Payer: Self-pay

## 2024-02-22 DIAGNOSIS — J301 Allergic rhinitis due to pollen: Secondary | ICD-10-CM

## 2024-02-22 MED ORDER — FLUTICASONE PROPIONATE 50 MCG/ACT NA SUSP: 2.0000 | Freq: Every day | NASAL | 1 refills | Status: DC

## 2024-02-22 NOTE — Telephone Encounter (Signed)
 Medication has been sent successfully.

## 2024-03-30 ENCOUNTER — Other Ambulatory Visit: Payer: Self-pay | Admitting: Internal Medicine

## 2024-03-30 DIAGNOSIS — M1A09X0 Idiopathic chronic gout, multiple sites, without tophus (tophi): Secondary | ICD-10-CM

## 2024-03-31 ENCOUNTER — Telehealth: Payer: Self-pay | Admitting: Internal Medicine

## 2024-03-31 NOTE — Telephone Encounter (Signed)
 Copied from CRM [id number]. Topic: Clinical - Prescription Issue >> Mar 31, 2024 10:41 AM Juleen Oakland F wrote: Reason for CRM: Patient says the probenecid  (BENEMID ) 500 MG tablet medication was denied. It seems like the pharmacy needs the DX Code - please resend prescription with diagnosis code or call pharmacy then call patient at [phone] (M) with an update.

## 2024-03-31 NOTE — Telephone Encounter (Signed)
 Called and spoke with pharmacy staff and they informed me everything went through on their end. I then attempted to call patient and had to provide a detailed voice message informing them that medication was ready for pick up

## 2024-04-11 ENCOUNTER — Encounter: Payer: Self-pay | Admitting: Pulmonary Disease

## 2024-04-11 ENCOUNTER — Ambulatory Visit: Payer: Self-pay | Admitting: Pulmonary Disease

## 2024-04-11 VITALS — BP 138/85 | HR 57 | Ht 75.0 in | Wt 204.2 lb

## 2024-04-11 DIAGNOSIS — J849 Interstitial pulmonary disease, unspecified: Secondary | ICD-10-CM

## 2024-04-11 DIAGNOSIS — J4540 Moderate persistent asthma, uncomplicated: Secondary | ICD-10-CM | POA: Diagnosis not present

## 2024-04-11 MED ORDER — TRELEGY ELLIPTA 200-62.5-25 MCG/ACT IN AEPB: 1.0000 | INHALATION_SPRAY | Freq: Every day | RESPIRATORY_TRACT | 12 refills | Status: AC

## 2024-04-11 NOTE — Patient Instructions (Signed)
 No change in medication continue Trelegy, refilled today  Will plan to repeat a CT scan next May and follow-up afterwards to discuss results and your symptoms  Return to clinic in 1 year after CT scan or sooner as needed with Dr. Marygrace Snellen

## 2024-04-11 NOTE — Progress Notes (Signed)
 @Patient  ID: Brendan Short, male    DOB: 02-24-1961, 63 y.o.   MRN: [id number]  Chief Complaint  Patient presents with   Follow-up    Patient states trelegy is helping.    Referring provider: Arcadio Knuckles, MD  HPI:   63 y.o. man whom we are seeing for evaluation of abnormal CT scan.  Multiple notes from PCP reviewed.    Returns for routine follow-up.  Escalated ICS/LABA therapy to Trelegy at last visit.  He thinks it helped some.  He remains active.  Able to do all he needs to do.  Exercises a little bit.  No exertional limitation.  This is all good news.  HPI at initial visit: Overall patient feels relatively well.  Does describe some dyspnea on exertion for about 1 year.  Little more labored doing activities compared to prior.  Sounds like a CT of the coronaries was obtained just for routine surveillance.  Finding scribe below.  On questioning he did not describe shortness of breath.  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  No birds.  Sprayed chemicals but no pain or other solvents.  No other pets.  Does have at long and seasonal allergy.  Just asthma is possible they often go hand-in-hand.  Chart  As part of workup CT coronary scan was obtained 04/2023 that showed mild subpleural reticular densities in both lungs, low coronary calcium score.  This prompted a high-res CT scan that demonstrated mild groundglass nodular-like opacities predominantly in the upper lobes, some air trapping on expiratory films on my review and interpretation.  Questionaires / Pulmonary Flowsheets:   ACT:      No data to display          MMRC:     No data to display          Epworth:      No data to display          Tests:   FENO:  No results found for: "NITRICOXIDE"  PFT:    Latest Ref Rng & Units 10/29/2023    8:50 AM  PFT Results  FVC-Pre L 3.77   FVC-Predicted Pre % 65   FVC-Post L 3.96   FVC-Predicted Post % 69   Pre FEV1/FVC % % 80   Post FEV1/FCV  % % 81   FEV1-Pre L 3.00   FEV1-Predicted Pre % 69   FEV1-Post L 3.22   DLCO uncorrected ml/min/mmHg 26.17   DLCO UNC% % 80   DLCO corrected ml/min/mmHg 26.17   DLCO COR %Predicted % 80   DLVA Predicted % 114   TLC L 7.32   TLC % Predicted % 89   RV % Predicted % 120   Personally reviewed interpreted as spirometry just of moderate restriction versus air trapping.  No significant bronchodilator response.  Lung volumes consistent with air trapping otherwise normal, no restriction.  DLCO within normal limits.  WALK:      No data to display          Imaging: Personally reviewed and as per EMR and discussion in this note No results found.  Lab Results: Personally reviewed CBC    Component Value Date/Time   WBC 4.8 12/19/2023 0901   RBC 4.27 12/19/2023 0901   HGB 14.7 12/19/2023 0901   HGB 14.4 04/18/2019 0753   HGB 14.9 04/01/2019 0928   HGB 14.7 10/12/2017 0756   HCT 42.9 12/19/2023 0901   HCT 43.3 04/01/2019 0928  HCT 41.5 10/12/2017 0756   PLT 137.0 (L) 12/19/2023 0901   PLT 129 (L) 04/18/2019 0753   PLT 132 (L) 04/01/2019 0928   MCV 100.6 (H) 12/19/2023 0901   MCV 96 04/01/2019 0928   MCV 98 10/12/2017 0756   MCH 33.7 04/18/2019 0753   MCHC 34.3 12/19/2023 0901   RDW 12.3 12/19/2023 0901   RDW 12.1 04/01/2019 0928   RDW 11.8 10/12/2017 0756   LYMPHSABS 1.3 12/19/2023 0901   LYMPHSABS 1.3 04/01/2019 0928   LYMPHSABS 1.2 10/12/2017 0756   MONOABS 0.5 12/19/2023 0901   EOSABS 0.1 12/19/2023 0901   EOSABS 0.1 04/01/2019 0928   EOSABS 0.1 10/12/2017 0756   BASOSABS 0.0 12/19/2023 0901   BASOSABS 0.0 04/01/2019 0928   BASOSABS 0.0 10/12/2017 0756    BMET    Component Value Date/Time   NA 141 12/19/2023 0901   NA 142 10/22/2020 0918   K 4.3 12/19/2023 0901   CL 101 12/19/2023 0901   CO2 30 12/19/2023 0901   GLUCOSE 112 (H) 12/19/2023 0901   BUN 19 12/19/2023 0901   BUN 17 10/22/2020 0918   CREATININE 1.00 12/19/2023 0901   CREATININE 0.94 04/18/2019  0753   CREATININE 0.97 02/03/2016 1446   CALCIUM 9.1 12/19/2023 0901   GFRNONAA 74 10/22/2020 0918   GFRNONAA >60 04/18/2019 0753   GFRNONAA 88 02/03/2016 1446   GFRAA 86 10/22/2020 0918   GFRAA >60 04/18/2019 0753   GFRAA >89 02/03/2016 1446    BNP No results found for: "BNP"  ProBNP    Component Value Date/Time   PROBNP 13.0 12/19/2023 0901    Specialty Problems       Pulmonary Problems   Seasonal allergic rhinitis due to pollen   Multiple lung nodules on CT   Pneumonitis   DOE (dyspnea on exertion)    Allergies  Allergen Reactions   Allopurinol     Immunization History  Administered Date(s) Administered   Influenza,inj,Quad PF,6+ Mos 09/03/2019   Influenza-Unspecified 09/14/2020, 09/11/2021, 08/22/2022, 08/14/2023   PFIZER(Purple Top)SARS-COV-2 Vaccination 05/31/2020, 06/21/2020   Td 04/01/2020   Tdap 02/01/2010   Zoster Recombinant(Shingrix ) 01/04/2022, 04/06/2022   Zoster, Live 11/13/2014    Past Medical History:  Diagnosis Date   Allergy    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   cholecalciferol (VITAMIN D ) 1000 UNITS tablet Take 1,000 Units by mouth daily.   colchicine  0.6 MG tablet Take 1 tablet (0.6 mg total) by mouth every other day.   cyclobenzaprine  (FLEXERIL ) 5 MG tablet Take 1 tablet (5 mg total) by mouth at bedtime as needed for muscle spasms. (Patient taking differently: Take 5 mg by mouth at bedtime as needed for muscle spasms. As needed)   diclofenac  (VOLTAREN ) 0.1 % ophthalmic solution 4 (four) times  daily. PRN   fluticasone  (FLONASE ) 50 MCG/ACT nasal spray Place 2 sprays into both nostrils daily.   Fluticasone -Umeclidin-Vilant (TRELEGY ELLIPTA ) 200-62.5-25 MCG/ACT AEPB Inhale 1 puff into the lungs daily.   glucosamine-chondroitin 500-400 MG tablet Take 1 tablet  by mouth 3 (three) times daily.   ketoconazole  (NIZORAL ) 2 % cream Apply 1 application. topically 2 (two) times daily.   levocetirizine (XYZAL ) 5 MG tablet Take 1 tablet (5 mg total) by mouth every evening. (Patient taking differently: Take 5 mg by mouth as needed for allergies.)   meloxicam  (MOBIC ) 15 MG tablet Take 1 tablet (15 mg total) by mouth daily. (Patient not taking: Reported on 12/19/2023)   probenecid  (BENEMID ) 500 MG tablet TAKE 1 TABLET BY MOUTH TWICE A DAY   Turmeric 500 MG TABS Take by mouth.   No facility-administered encounter medications on file as of 04/11/2024.     Review of Systems  Review of Systems  N/a Physical Exam  BP 138/85 (BP Location: Left Arm, Patient Position: Sitting, Cuff Size: Normal)   Pulse (!) 57   Ht 6\' 3"  (1.905 m)   Wt 204 lb 3.2 oz (92.6 kg)   SpO2 95%   BMI 25.52 kg/m   Wt Readings from Last 5 Encounters:  04/11/24 204 lb 3.2 oz (92.6 kg)  12/19/23 208 lb 2 oz (94.4 kg)  10/29/23 215 lb (97.5 kg)  07/24/23 209 lb 12.8 oz (95.2 kg)  06/12/23 209 lb (94.8 kg)    BMI Readings from Last 5 Encounters:  04/11/24 25.52 kg/m  12/19/23 25.33 kg/m  10/29/23 26.17 kg/m  07/24/23 26.22 kg/m  06/12/23 26.12 kg/m     Physical Exam General: Sitting in chair, no acute distress Eyes: EOMI, no icterus Neck: Supple, no JVP Pulmonary: Clear, no work of breathing Cardiovascular: Warm, no edema Abdomen: Nondistended bowel sounds present MSK: No synovitis, no joint effusion Neuro: Normal gait, no weakness Psych: Normal mood, full affect   Assessment & Plan:   Dyspnea on exertion: Suspect related to abnormalities on CT scan as discussed below.  History of seasonal allergies,  asthma is possible.  PFTs consistent with air trapping no fixed obstruction otherwise normal.  Likely related to asthma.  Improved with escalation of triple inhaled therapy.  Moderate persistent asthma: History of atopic symptoms.  Onset of dyspnea on exertion.  Improved with Advair/Wixela mid dose.  PFTs with air trapping and ongoing cough.  Escalated to Trelegy at last visit with interval improvement.  To continue Trelegy, refilled today.  Abnormal CT scan: Scattered lobular groundglass opacities upper lobe predominant.  High suspicion for hypersensitivity pneumonitis.  CRP, sed rate within normal limits.  HSP panel negative.  PFTs within normal limits no restriction.  Repeat CT scan 2-year interval 2026 ordered today.   No follow-ups on file.   Guerry Leek, MD 04/11/2024

## 2024-04-17 LAB — PSA: PSA: 4.1

## 2024-05-07 NOTE — Telephone Encounter (Signed)
 done

## 2024-05-15 ENCOUNTER — Telehealth: Payer: Self-pay | Admitting: Internal Medicine

## 2024-05-15 NOTE — Telephone Encounter (Signed)
-----   Message from Debby Molt sent at 04/16/2023  9:43 AM EDT ----- Regarding: aTAA recheck

## 2024-05-19 ENCOUNTER — Other Ambulatory Visit: Payer: Self-pay | Admitting: Internal Medicine

## 2024-05-19 DIAGNOSIS — M1A09X0 Idiopathic chronic gout, multiple sites, without tophus (tophi): Secondary | ICD-10-CM

## 2024-05-19 NOTE — Progress Notes (Unsigned)
 [facility] [address] [place] [address] Phone: [phone]   Assessment and Plan:     There are no diagnoses linked to this encounter.  *** - Patient has received relief with OMT in the past.  Elects for repeat OMT today.  Tolerated well per note below. - Decision today to treat with OMT was based on Physical Exam   After verbal consent patient was treated with HVLA (high velocity low amplitude), ME (muscle energy), FPR (flex positional release), ST (soft tissue), PC/PD (Pelvic Compression/ Pelvic Decompression) techniques in cervical, rib, thoracic, lumbar, and pelvic areas. Patient tolerated the procedure well with improvement in symptoms.  Patient educated on potential side effects of soreness and recommended to rest, hydrate, and use Tylenol  as needed for pain control.   Pertinent previous records reviewed include ***    Follow Up: ***     Subjective:   I, Suhaila Troiano, am serving as a Neurosurgeon for Doctor Morene Mace   Chief Complaint: hip pain    HPI:  05/20/2024 Patient is a 63 year old male with hip pain. 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  Current Outpatient Medications  Medication Sig Dispense Refill   azelastine  (OPTIVAR ) 0.05 % ophthalmic solution Place 1 drop into both eyes 2 (two) times daily. 6 mL 12   cholecalciferol (VITAMIN D ) 1000 UNITS tablet Take 1,000 Units by mouth daily.     colchicine  0.6 MG tablet Take 1 tablet (0.6 mg total) by mouth every other day. 45 tablet 0   cyclobenzaprine  (FLEXERIL ) 5 MG tablet Take 1 tablet (5 mg total) by mouth at bedtime as needed for muscle spasms. (Patient taking differently: Take 5 mg by mouth at bedtime as needed for muscle spasms. As needed) 40 tablet 2   diclofenac  (VOLTAREN ) 0.1 % ophthalmic solution 4 (four) times daily. PRN     fluticasone   (FLONASE ) 50 MCG/ACT nasal spray Place 2 sprays into both nostrils daily. 48 g 1   Fluticasone -Umeclidin-Vilant (TRELEGY ELLIPTA ) 200-62.5-25 MCG/ACT AEPB Inhale 1 puff into the lungs daily. 60 each 12   glucosamine-chondroitin 500-400 MG tablet Take 1 tablet by mouth 3 (three) times daily.     ketoconazole  (NIZORAL ) 2 % cream Apply 1 application. topically 2 (two) times daily. 60 g 2   levocetirizine (XYZAL ) 5 MG tablet Take 1 tablet (5 mg total) by mouth every evening. (Patient taking differently: Take 5 mg by mouth as needed for allergies.) 90 tablet 1   meloxicam  (MOBIC ) 15 MG tablet Take 1 tablet (15 mg total) by mouth daily. (Patient not taking: Reported on 12/19/2023) 90 tablet 0   probenecid  (BENEMID ) 500 MG tablet TAKE 1 TABLET BY MOUTH TWICE A DAY 60 tablet 2   Turmeric 500 MG TABS Take by mouth.     No current facility-administered medications for this visit.      Objective:     There were no vitals filed for this visit.    There is no height or weight on file to calculate BMI.    Physical Exam:     General: Well-appearing, cooperative, sitting comfortably in no acute distress.   OMT Physical Exam:  ASIS Compression Test: Positive Right Cervical: TTP paraspinal, *** Rib: Bilateral elevated first rib with TTP Thoracic: TTP paraspinal,*** Lumbar: TTP paraspinal,*** Pelvis: Right anterior innominate  Electronically signed by:  Odis Mace [facility]  7:45 AM 05/19/24

## 2024-05-20 ENCOUNTER — Other Ambulatory Visit: Payer: Self-pay | Admitting: Internal Medicine

## 2024-05-20 ENCOUNTER — Ambulatory Visit: Admitting: Sports Medicine

## 2024-05-20 VITALS — BP 122/84 | HR 68 | Ht 75.0 in | Wt 202.0 lb

## 2024-05-20 DIAGNOSIS — M7062 Trochanteric bursitis, left hip: Secondary | ICD-10-CM

## 2024-05-20 DIAGNOSIS — M1A09X0 Idiopathic chronic gout, multiple sites, without tophus (tophi): Secondary | ICD-10-CM

## 2024-05-20 NOTE — Telephone Encounter (Unsigned)
 Copied from CRM [id number]. Topic: Clinical - Medication Refill >> May 20, 2024  9:23 AM Antwanette L wrote: Medication: colchicine  0.6 MG tablet  Has the patient contacted their pharmacy? Yes   This is the patient's preferred pharmacy:  [id number] [place], [place] - [address] [place] [address] Phone: [phone] Fax: [phone]  Is this the correct pharmacy for this prescription? Yes   Has the prescription been filled recently? No. Last refilled on 12/19/23  Is the patient out of the medication? No.  Has the patient been seen for an appointment in the last year OR does the patient have an upcoming appointment? Yes. Last ov with Dr. Joshua was on 12/19/23. Upcoming appt is on 06/17/24  Can we respond through MyChart? Yes  Agent: Please be advised that Rx refills may take up to 3 business days. We ask that you follow-up with your pharmacy.

## 2024-05-20 NOTE — Patient Instructions (Signed)
 Glute Hep. Use topical voltaren  gel over areas of pain. Follow up as needed.

## 2024-05-21 MED ORDER — COLCHICINE 0.6 MG PO TABS: 0.6000 mg | ORAL_TABLET | ORAL | 0 refills | Status: DC

## 2024-06-17 ENCOUNTER — Ambulatory Visit: Payer: 59 | Admitting: Internal Medicine

## 2024-06-17 ENCOUNTER — Ambulatory Visit: Payer: Self-pay | Admitting: Internal Medicine

## 2024-06-17 ENCOUNTER — Encounter: Payer: Self-pay | Admitting: Internal Medicine

## 2024-06-17 VITALS — BP 132/84 | HR 55 | Temp 97.6°F | Resp 16 | Ht 75.0 in | Wt 202.4 lb

## 2024-06-17 DIAGNOSIS — Z0001 Encounter for general adult medical examination with abnormal findings: Secondary | ICD-10-CM

## 2024-06-17 DIAGNOSIS — D510 Vitamin B12 deficiency anemia due to intrinsic factor deficiency: Secondary | ICD-10-CM | POA: Diagnosis not present

## 2024-06-17 DIAGNOSIS — M1A09X0 Idiopathic chronic gout, multiple sites, without tophus (tophi): Secondary | ICD-10-CM

## 2024-06-17 DIAGNOSIS — Z0000 Encounter for general adult medical examination without abnormal findings: Secondary | ICD-10-CM

## 2024-06-17 DIAGNOSIS — M545 Low back pain, unspecified: Secondary | ICD-10-CM | POA: Diagnosis not present

## 2024-06-17 DIAGNOSIS — M19042 Primary osteoarthritis, left hand: Secondary | ICD-10-CM

## 2024-06-17 DIAGNOSIS — I10 Essential (primary) hypertension: Secondary | ICD-10-CM | POA: Diagnosis not present

## 2024-06-17 DIAGNOSIS — Z23 Encounter for immunization: Secondary | ICD-10-CM

## 2024-06-17 DIAGNOSIS — J984 Other disorders of lung: Secondary | ICD-10-CM

## 2024-06-17 DIAGNOSIS — G8929 Other chronic pain: Secondary | ICD-10-CM

## 2024-06-17 LAB — CBC WITH DIFFERENTIAL/PLATELET
Basophils Absolute: 0 K/uL (ref 0.0–0.1)
Basophils Relative: 0.8 % (ref 0.0–3.0)
Eosinophils Absolute: 0 K/uL (ref 0.0–0.7)
Eosinophils Relative: 1.2 % (ref 0.0–5.0)
HCT: 41.1 % (ref 39.0–52.0)
Hemoglobin: 14.2 g/dL (ref 13.0–17.0)
Lymphocytes Relative: 30.4 % (ref 12.0–46.0)
Lymphs Abs: 1.1 K/uL (ref 0.7–4.0)
MCHC: 34.5 g/dL (ref 30.0–36.0)
MCV: 98.6 fl (ref 78.0–100.0)
Monocytes Absolute: 0.4 K/uL (ref 0.1–1.0)
Monocytes Relative: 10.4 % (ref 3.0–12.0)
Neutro Abs: 2.1 K/uL (ref 1.4–7.7)
Neutrophils Relative %: 57.2 % (ref 43.0–77.0)
Platelets: 141 K/uL — ABNORMAL LOW (ref 150.0–400.0)
RBC: 4.17 Mil/uL — ABNORMAL LOW (ref 4.22–5.81)
RDW: 13.1 % (ref 11.5–15.5)
WBC: 3.6 K/uL — ABNORMAL LOW (ref 4.0–10.5)

## 2024-06-17 LAB — BASIC METABOLIC PANEL WITH GFR
BUN: 16 mg/dL (ref 6–23)
CO2: 30 meq/L (ref 19–32)
Calcium: 9.4 mg/dL (ref 8.4–10.5)
Chloride: 102 meq/L (ref 96–112)
Creatinine, Ser: 0.97 mg/dL (ref 0.40–1.50)
GFR: 82.65 mL/min (ref >60.00)
Glucose, Bld: 113 mg/dL — ABNORMAL HIGH (ref 70–99)
Potassium: 4.6 meq/L (ref 3.5–5.1)
Sodium: 139 meq/L (ref 135–145)

## 2024-06-17 LAB — URIC ACID: Uric Acid, Serum: 4.9 mg/dL (ref 4.0–7.8)

## 2024-06-17 MED ORDER — PROBENECID 500 MG PO TABS: 500.0000 mg | ORAL_TABLET | Freq: Two times a day (BID) | ORAL | 0 refills | Status: DC

## 2024-06-17 MED ORDER — CYANOCOBALAMIN 1000 MCG/ML IJ SOLN: 1000.0000 ug | INTRAMUSCULAR | 1 refills | Status: DC

## 2024-06-17 MED ORDER — MELOXICAM 15 MG PO TABS: 15.0000 mg | ORAL_TABLET | Freq: Every day | ORAL | 0 refills | Status: AC | PRN

## 2024-06-17 MED ORDER — COLCHICINE 0.6 MG PO TABS: 0.6000 mg | ORAL_TABLET | ORAL | 1 refills | Status: AC

## 2024-06-17 NOTE — Progress Notes (Signed)
 "  Subjective:  Patient ID: Brendan Short, male    DOB: 12/10/1960  Age: 63 y.o. MRN: [id number]  CC: Annual Exam and Hypertension   HPI Brendan Short presents for a CPX and f/up ----   Discussed the use of AI scribe software for clinical note transcription with the patient, who gave verbal consent to proceed.  History of Present Illness Brendan Short is a 63 year old male who presents for a follow-up visit.  He feels good overall with no dizziness or lightheadedness except occasionally when standing up after stretching. He experiences good endurance while walking.  He has been using Trelegy Ellipta  for approximately nine months, which he feels has been beneficial. He notes that his condition is better than it was a couple of years ago.  No symptoms of a gout attack and no bleeding or bruising from low platelets. He is currently taking probenecid  twice a day and Colcrys  every other day, which he reports is effective. He mentions issues with prescription approval but has been managing it through [employer].  He receives B12 injections monthly from a nurse friend and recently had one two weeks ago. He mentions needing to renew his prescription for the B12 capsules.  He reports soreness at the base of his left thumb, which he attributes to texting. He has meloxicam  available but has not used it yet.  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    Outpatient Medications Prior to Visit  Medication Sig Dispense Refill   Fluticasone -Umeclidin-Vilant (TRELEGY ELLIPTA ) 200-62.5-25 MCG/ACT AEPB Inhale 1 puff into the lungs daily. 60 each 12   colchicine  0.6 MG tablet Take 1 tablet (0.6 mg total) by mouth every other day. 45 tablet 0   probenecid  (BENEMID ) 500 MG tablet TAKE 1 TABLET BY MOUTH TWICE A DAY 60 tablet 2   azelastine  (OPTIVAR ) 0.05 % ophthalmic solution Place 1 drop into both eyes 2 (two) times daily. (Patient not taking:  Reported on 06/17/2024) 6 mL 12   cholecalciferol (VITAMIN D ) 1000 UNITS tablet Take 1,000 Units by mouth daily. (Patient not taking: Reported on 06/17/2024)     cyclobenzaprine  (FLEXERIL ) 5 MG tablet Take 1 tablet (5 mg total) by mouth at bedtime as needed for muscle spasms. (Patient not taking: Reported on 06/17/2024) 40 tablet 2   diclofenac  (VOLTAREN ) 0.1 % ophthalmic solution 4 (four) times daily. PRN (Patient not taking: Reported on 06/17/2024)     fluticasone  (FLONASE ) 50 MCG/ACT nasal spray Place 2 sprays into both nostrils daily. (Patient not taking: Reported on 06/17/2024) 48 g 1   glucosamine-chondroitin 500-400 MG tablet Take 1 tablet by mouth 3 (three) times daily. (Patient not taking: Reported on 06/17/2024)     ketoconazole  (NIZORAL ) 2 % cream Apply 1 application. topically 2 (two) times daily. (Patient not taking: Reported on 06/17/2024) 60 g 2   levocetirizine (XYZAL ) 5 MG tablet Take 1 tablet (5 mg total) by mouth every evening. (Patient not taking: Reported on 06/17/2024) 90 tablet 1   Turmeric 500 MG TABS Take by mouth. (Patient not taking: Reported on 06/17/2024)     meloxicam  (MOBIC ) 15 MG tablet Take 1 tablet (15 mg total) by mouth daily. (Patient not taking: Reported on 06/17/2024) 90 tablet 0   No facility-administered medications prior to visit.    ROS Review of Systems  Constitutional: Negative.  Negative for appetite change, chills, diaphoresis, fatigue and fever.  HENT: Negative.  Eyes: Negative.   Respiratory: Negative.  Negative for cough, chest tightness, shortness of breath and wheezing.   Cardiovascular:  Negative for chest pain, palpitations and leg swelling.  Gastrointestinal: Negative.  Negative for abdominal pain, constipation, diarrhea, nausea and vomiting.  Endocrine: Negative.   Genitourinary: Negative.  Negative for difficulty urinating and hematuria.  Musculoskeletal:  Positive for arthralgias and back pain. Negative for joint swelling and myalgias.  Skin:  Negative  for color change.  Neurological:  Negative for dizziness and weakness.  Hematological:  Negative for adenopathy. Does not bruise/bleed easily.  Psychiatric/Behavioral: Negative.      Objective:  BP 132/84   Pulse (!) 55   Temp 97.6 F (36.4 C) (Oral)   Resp 16   Ht 6' 3 (1.905 m)   Wt 202 lb 6.4 oz (91.8 kg)   SpO2 98%   BMI 25.30 kg/m   BP Readings from Last 3 Encounters:  06/17/24 132/84  05/20/24 122/84  04/11/24 138/85    Wt Readings from Last 3 Encounters:  06/17/24 202 lb 6.4 oz (91.8 kg)  05/20/24 202 lb (91.6 kg)  04/11/24 204 lb 3.2 oz (92.6 kg)    Physical Exam Vitals reviewed.  Constitutional:      Appearance: Normal appearance.  HENT:     Nose: Nose normal.     Mouth/Throat:     Mouth: Mucous membranes are moist.  Eyes:     General: No scleral icterus.    Conjunctiva/sclera: Conjunctivae normal.  Cardiovascular:     Rate and Rhythm: Normal rate and regular rhythm.     Pulses: Normal pulses.     Heart sounds: No murmur heard.    No friction rub. No gallop.  Pulmonary:     Effort: Pulmonary effort is normal.     Breath sounds: No stridor. No wheezing, rhonchi or rales.  Abdominal:     Palpations: There is no mass.     Tenderness: There is no abdominal tenderness. There is no guarding.     Hernia: No hernia is present.  Musculoskeletal:        General: Normal range of motion.     Cervical back: Neck supple.     Right lower leg: No edema.     Left lower leg: No edema.  Lymphadenopathy:     Cervical: No cervical adenopathy.  Skin:    General: Skin is warm and dry.     Findings: No bruising.  Neurological:     Mental Status: He is alert.  Psychiatric:        Mood and Affect: Mood normal.        Behavior: Behavior normal.     Lab Results  Component Value Date   WBC 3.6 (L) 06/17/2024   HGB 14.2 06/17/2024   HCT 41.1 06/17/2024   PLT 141.0 (L) 06/17/2024   GLUCOSE 113 (H) 06/17/2024   CHOL 193 12/19/2023   TRIG 159.0 (H) 12/19/2023    HDL 44.80 12/19/2023   LDLCALC 116 (H) 12/19/2023   ALT 22 12/19/2023   AST 18 12/19/2023   NA 139 06/17/2024   K 4.6 06/17/2024   CL 102 06/17/2024   CREATININE 0.97 06/17/2024   BUN 16 06/17/2024   CO2 30 06/17/2024   TSH 2.80 12/19/2023   PSA 4.1 04/17/2024   HGBA1C 5.6 12/19/2023    CT CARDIAC SCORING ([facility] LOCATIONS ONLY) Result Date: 01/16/2024 CLINICAL DATA:  63 year old Caucasian male with history of hypertension, dyspnea on exertion and family history of coronary  artery disease. * Tracking Code: FCC * EXAM: CT CARDIAC CORONARY ARTERY CALCIUM SCORE TECHNIQUE: Non-contrast imaging through the heart was performed using prospective ECG gating. Image post processing was performed on an independent workstation, allowing for quantitative analysis of the heart and coronary arteries. Note that this exam targets the heart and the chest was not imaged in its entirety. COMPARISON:  No priors. FINDINGS: CORONARY CALCIUM SCORES: Left Main: 0 LAD: 0 LCx: 0 RCA/PDA: 0 Total Agatston Score: 0 MESA database percentile: N/A AORTA MEASUREMENTS: Ascending Aorta: 3.8 cm Descending Aorta:2.8 cm OTHER FINDINGS: Within the visualized portions of the thorax there are no suspicious appearing pulmonary nodules or masses, there is no acute consolidative airspace disease, no pleural effusions, no pneumothorax and no lymphadenopathy. Visualized portions of the upper abdomen are unremarkable. There are no aggressive appearing lytic or blastic lesions noted in the visualized portions of the skeleton. IMPRESSION: 1. Patient's total coronary artery calcium score is 0 which indicates a very low (but non-zero) risk of significant coronary artery atherosclerosis. 2. No significant incidental noncardiac findings are noted. Electronically Signed   By: Toribio Aye M.D.   On: 01/16/2024 07:56    Assessment & Plan:  Vitamin B12 deficiency anemia due to intrinsic factor deficiency -     CBC with Differential/Platelet;  Future -     Cyanocobalamin ; Inject 1 mL (1,000 mcg total) into the muscle every 30 (thirty) days.  Dispense: 3 mL; Refill: 1  Primary hypertension- BP is well controlled. -     Basic metabolic panel with GFR; Future  Idiopathic chronic gout of multiple sites without tophus -     Basic metabolic panel with GFR; Future -     Uric acid; Future -     Colchicine ; Take 1 tablet (0.6 mg total) by mouth every other day.  Dispense: 45 tablet; Refill: 1 -     Meloxicam ; Take 1 tablet (15 mg total) by mouth daily as needed for pain.  Dispense: 90 tablet; Refill: 0 -     Probenecid ; Take 1 tablet (500 mg total) by mouth 2 (two) times daily.  Dispense: 180 tablet; Refill: 0  Chronic low back pain without sciatica, unspecified back pain laterality -     Meloxicam ; Take 1 tablet (15 mg total) by mouth daily as needed for pain.  Dispense: 90 tablet; Refill: 0  Primary osteoarthritis of left hand -     Meloxicam ; Take 1 tablet (15 mg total) by mouth daily as needed for pain.  Dispense: 90 tablet; Refill: 0  Pneumonitis  Encounter for general adult medical examination with abnormal findings- Exam completed, labs reviewed, vaccines reviewed and updated, cancer screenings are UTD, pt ed material was given.   Immunization due -     Pneumococcal conjugate vaccine 20-valent     Follow-up: Return in about 6 months (around 12/18/2024).  Debby Molt, MD "

## 2024-06-17 NOTE — Patient Instructions (Signed)
 Health Maintenance, Male  Adopting a healthy lifestyle and getting preventive care are important in promoting health and wellness. Ask your health care provider about:  The right schedule for you to have regular tests and exams.  Things you can do on your own to prevent diseases and keep yourself healthy.  What should I know about diet, weight, and exercise?  Eat a healthy diet    Eat a diet that includes plenty of vegetables, fruits, low-fat dairy products, and lean protein.  Do not eat a lot of foods that are high in solid fats, added sugars, or sodium.  Maintain a healthy weight  Body mass index (BMI) is a measurement that can be used to identify possible weight problems. It estimates body fat based on height and weight. Your health care provider can help determine your BMI and help you achieve or maintain a healthy weight.  Get regular exercise  Get regular exercise. This is one of the most important things you can do for your health. Most adults should:  Exercise for at least 150 minutes each week. The exercise should increase your heart rate and make you sweat (moderate-intensity exercise).  Do strengthening exercises at least twice a week. This is in addition to the moderate-intensity exercise.  Spend less time sitting. Even light physical activity can be beneficial.  Watch cholesterol and blood lipids  Have your blood tested for lipids and cholesterol at 64 years of age, then have this test every 5 years.  You may need to have your cholesterol levels checked more often if:  Your lipid or cholesterol levels are high.  You are older than 64 years of age.  You are at high risk for heart disease.  What should I know about cancer screening?  Many types of cancers can be detected early and may often be prevented. Depending on your health history and family history, you may need to have cancer screening at various ages. This may include screening for:  Colorectal cancer.  Prostate cancer.  Skin cancer.  Lung  cancer.  What should I know about heart disease, diabetes, and high blood pressure?  Blood pressure and heart disease  High blood pressure causes heart disease and increases the risk of stroke. This is more likely to develop in people who have high blood pressure readings or are overweight.  Talk with your health care provider about your target blood pressure readings.  Have your blood pressure checked:  Every 3-5 years if you are 9-95 years of age.  Every year if you are 85 years old or older.  If you are between the ages of 29 and 29 and are a current or former smoker, ask your health care provider if you should have a one-time screening for abdominal aortic aneurysm (AAA).  Diabetes  Have regular diabetes screenings. This checks your fasting blood sugar level. Have the screening done:  Once every three years after age 23 if you are at a normal weight and have a low risk for diabetes.  More often and at a younger age if you are overweight or have a high risk for diabetes.  What should I know about preventing infection?  Hepatitis B  If you have a higher risk for hepatitis B, you should be screened for this virus. Talk with your health care provider to find out if you are at risk for hepatitis B infection.  Hepatitis C  Blood testing is recommended for:  Everyone born from [birth year] through 1966.  Anyone  with known risk factors for hepatitis C.  Sexually transmitted infections (STIs)  You should be screened each year for STIs, including gonorrhea and chlamydia, if:  You are sexually active and are younger than 64 years of age.  You are older than 64 years of age and your health care provider tells you that you are at risk for this type of infection.  Your sexual activity has changed since you were last screened, and you are at increased risk for chlamydia or gonorrhea. Ask your health care provider if you are at risk.  Ask your health care provider about whether you are at high risk for HIV. Your health care provider  may recommend a prescription medicine to help prevent HIV infection. If you choose to take medicine to prevent HIV, you should first get tested for HIV. You should then be tested every 3 months for as long as you are taking the medicine.  Follow these instructions at home:  Alcohol use  Do not drink alcohol if your health care provider tells you not to drink.  If you drink alcohol:  Limit how much you have to 0-2 drinks a day.  Know how much alcohol is in your drink. In the [place] one drink equals one 12 oz bottle of beer (355 mL), one 5 oz glass of wine (148 mL), or one 1 oz glass of hard liquor (44 mL).  Lifestyle  Do not use any products that contain nicotine or tobacco. These products include cigarettes, chewing tobacco, and vaping devices, such as e-cigarettes. If you need help quitting, ask your health care provider.  Do not use street drugs.  Do not share needles.  Ask your health care provider for help if you need support or information about quitting drugs.  General instructions  Schedule regular health, dental, and eye exams.  Stay current with your vaccines.  Tell your health care provider if:  You often feel depressed.  You have ever been abused or do not feel safe at home.  Summary  Adopting a healthy lifestyle and getting preventive care are important in promoting health and wellness.  Follow your health care provider's instructions about healthy diet, exercising, and getting tested or screened for diseases.  Follow your health care provider's instructions on monitoring your cholesterol and blood pressure.  This information is not intended to replace advice given to you by your health care provider. Make sure you discuss any questions you have with your health care provider.  Document Revised: 03/21/2021 Document Reviewed: 03/21/2021  Elsevier Patient Education  2024 [employer].

## 2024-07-10 ENCOUNTER — Other Ambulatory Visit: Payer: Self-pay | Admitting: Internal Medicine

## 2024-07-10 DIAGNOSIS — D510 Vitamin B12 deficiency anemia due to intrinsic factor deficiency: Secondary | ICD-10-CM

## 2024-08-13 ENCOUNTER — Other Ambulatory Visit: Payer: Self-pay | Admitting: Internal Medicine

## 2024-08-13 DIAGNOSIS — J301 Allergic rhinitis due to pollen: Secondary | ICD-10-CM

## 2024-09-17 ENCOUNTER — Encounter: Admitting: Internal Medicine

## 2024-10-07 ENCOUNTER — Other Ambulatory Visit: Payer: Self-pay | Admitting: Internal Medicine

## 2024-10-07 ENCOUNTER — Telehealth: Payer: Self-pay | Admitting: Internal Medicine

## 2024-10-07 DIAGNOSIS — M1A09X0 Idiopathic chronic gout, multiple sites, without tophus (tophi): Secondary | ICD-10-CM

## 2024-10-07 NOTE — Telephone Encounter (Signed)
 Copied from CRM #[id number]. Topic: Clinical - Medication Question >> Oct 07, 2024  4:43 PM China J wrote: Reason for CRM: The patient is completley out of his probenecid  (BENEMID ) 500 MG tablet and was wondering if this could be approved before Thanksgiving. >> Oct 07, 2024  4:46 PM China J wrote: *UPDATE* Patient has 6 pills left. He would like if multiple refills could be added so he can follow up through MyChart/pharmacy for refills.

## 2024-10-14 NOTE — Telephone Encounter (Signed)
 Medication sent in 11/26

## 2024-12-24 ENCOUNTER — Ambulatory Visit: Admitting: Internal Medicine

## 2025-01-22 ENCOUNTER — Ambulatory Visit: Admitting: Internal Medicine

## 2025-03-16 ENCOUNTER — Other Ambulatory Visit

## 2025-03-23 ENCOUNTER — Other Ambulatory Visit

## 2025-04-13 ENCOUNTER — Ambulatory Visit: Admitting: Pulmonary Disease

## 2025-06-18 ENCOUNTER — Encounter: Admitting: Internal Medicine
